# Patient Record
Sex: Female | Born: 1955 | Race: White | Hispanic: No | Marital: Married | Smoking: Never smoker
Health system: Southern US, Community
[De-identification: ages and names within clinical notes are randomized; demographics above are authoritative.]

## PROBLEM LIST (undated history)

## (undated) DIAGNOSIS — E079 Disorder of thyroid, unspecified: Secondary | ICD-10-CM

## (undated) DIAGNOSIS — F329 Major depressive disorder, single episode, unspecified: Secondary | ICD-10-CM

## (undated) DIAGNOSIS — F32A Depression, unspecified: Secondary | ICD-10-CM

## (undated) DIAGNOSIS — L680 Hirsutism: Secondary | ICD-10-CM

## (undated) DIAGNOSIS — J019 Acute sinusitis, unspecified: Secondary | ICD-10-CM

## (undated) DIAGNOSIS — E785 Hyperlipidemia, unspecified: Secondary | ICD-10-CM

## (undated) DIAGNOSIS — R21 Rash and other nonspecific skin eruption: Secondary | ICD-10-CM

## (undated) DIAGNOSIS — F341 Dysthymic disorder: Secondary | ICD-10-CM

## (undated) HISTORY — DX: Rash and other nonspecific skin eruption: R21

## (undated) HISTORY — DX: Acute sinusitis, unspecified: J01.90

## (undated) HISTORY — DX: Hyperlipidemia, unspecified: E78.5

## (undated) HISTORY — DX: Depression, unspecified: F32.A

## (undated) HISTORY — DX: Dysthymic disorder: F34.1

## (undated) HISTORY — DX: Disorder of thyroid, unspecified: E07.9

## (undated) HISTORY — PX: PARTIAL HYSTERECTOMY: SHX80

## (undated) HISTORY — DX: Hirsutism: L68.0

## (undated) HISTORY — PX: BACK SURGERY: SHX140

## (undated) HISTORY — PX: TUBAL LIGATION: SHX77

## (undated) HISTORY — PX: TONSILLECTOMY: SUR1361

## (undated) HISTORY — DX: Major depressive disorder, single episode, unspecified: F32.9

---

## 1998-01-05 ENCOUNTER — Ambulatory Visit (HOSPITAL_COMMUNITY): Admission: RE | Admit: 1998-01-05 | Discharge: 1998-01-05 | Payer: Self-pay | Admitting: Obstetrics & Gynecology

## 1998-05-04 ENCOUNTER — Emergency Department (HOSPITAL_COMMUNITY): Admission: EM | Admit: 1998-05-04 | Discharge: 1998-05-04 | Payer: Self-pay | Admitting: Emergency Medicine

## 1998-06-15 ENCOUNTER — Other Ambulatory Visit: Admission: RE | Admit: 1998-06-15 | Discharge: 1998-06-15 | Payer: Self-pay | Admitting: Obstetrics & Gynecology

## 1999-01-11 ENCOUNTER — Ambulatory Visit (HOSPITAL_COMMUNITY): Admission: RE | Admit: 1999-01-11 | Discharge: 1999-01-11 | Payer: Self-pay | Admitting: Obstetrics & Gynecology

## 1999-01-11 ENCOUNTER — Encounter: Payer: Self-pay | Admitting: Obstetrics & Gynecology

## 2000-01-13 ENCOUNTER — Encounter: Payer: Self-pay | Admitting: Obstetrics & Gynecology

## 2000-01-13 ENCOUNTER — Ambulatory Visit (HOSPITAL_COMMUNITY): Admission: RE | Admit: 2000-01-13 | Discharge: 2000-01-13 | Payer: Self-pay | Admitting: Obstetrics & Gynecology

## 2001-03-06 ENCOUNTER — Ambulatory Visit (HOSPITAL_COMMUNITY): Admission: RE | Admit: 2001-03-06 | Discharge: 2001-03-06 | Payer: Self-pay | Admitting: Neurosurgery

## 2001-03-06 ENCOUNTER — Encounter: Payer: Self-pay | Admitting: Neurosurgery

## 2001-10-25 ENCOUNTER — Encounter: Payer: Self-pay | Admitting: Obstetrics & Gynecology

## 2001-10-25 ENCOUNTER — Ambulatory Visit (HOSPITAL_COMMUNITY): Admission: RE | Admit: 2001-10-25 | Discharge: 2001-10-25 | Payer: Self-pay | Admitting: Obstetrics & Gynecology

## 2002-02-04 ENCOUNTER — Other Ambulatory Visit: Admission: RE | Admit: 2002-02-04 | Discharge: 2002-02-04 | Payer: Self-pay | Admitting: Obstetrics & Gynecology

## 2003-06-27 ENCOUNTER — Other Ambulatory Visit: Admission: RE | Admit: 2003-06-27 | Discharge: 2003-06-27 | Payer: Self-pay | Admitting: Obstetrics & Gynecology

## 2003-08-29 ENCOUNTER — Inpatient Hospital Stay (HOSPITAL_COMMUNITY): Admission: AD | Admit: 2003-08-29 | Discharge: 2003-08-30 | Payer: Self-pay | Admitting: Obstetrics and Gynecology

## 2004-07-15 ENCOUNTER — Other Ambulatory Visit: Admission: RE | Admit: 2004-07-15 | Discharge: 2004-07-15 | Payer: Self-pay | Admitting: Obstetrics & Gynecology

## 2005-07-24 ENCOUNTER — Other Ambulatory Visit: Admission: RE | Admit: 2005-07-24 | Discharge: 2005-07-24 | Payer: Self-pay | Admitting: Obstetrics & Gynecology

## 2007-09-13 ENCOUNTER — Ambulatory Visit (HOSPITAL_COMMUNITY): Admission: RE | Admit: 2007-09-13 | Discharge: 2007-09-13 | Payer: Self-pay | Admitting: Obstetrics & Gynecology

## 2008-09-13 ENCOUNTER — Ambulatory Visit (HOSPITAL_COMMUNITY): Admission: RE | Admit: 2008-09-13 | Discharge: 2008-09-13 | Payer: Self-pay | Admitting: Obstetrics & Gynecology

## 2009-09-14 ENCOUNTER — Ambulatory Visit (HOSPITAL_COMMUNITY): Admission: RE | Admit: 2009-09-14 | Discharge: 2009-09-14 | Payer: Self-pay | Admitting: Obstetrics & Gynecology

## 2010-09-16 ENCOUNTER — Ambulatory Visit (HOSPITAL_COMMUNITY)
Admission: RE | Admit: 2010-09-16 | Discharge: 2010-09-16 | Payer: Self-pay | Source: Home / Self Care | Attending: Obstetrics & Gynecology | Admitting: Obstetrics & Gynecology

## 2011-04-25 ENCOUNTER — Emergency Department (HOSPITAL_COMMUNITY): Payer: BC Managed Care – PPO

## 2011-04-25 ENCOUNTER — Emergency Department (HOSPITAL_COMMUNITY)
Admission: EM | Admit: 2011-04-25 | Discharge: 2011-04-25 | Disposition: A | Discharge status: Home / Self Care | Payer: BC Managed Care – PPO | Attending: Emergency Medicine | Admitting: Emergency Medicine

## 2011-04-25 ENCOUNTER — Inpatient Hospital Stay (INDEPENDENT_AMBULATORY_CARE_PROVIDER_SITE_OTHER)
Admission: RE | Admit: 2011-04-25 | Discharge: 2011-04-25 | Disposition: A | Discharge status: Home / Self Care | Payer: BC Managed Care – PPO | Source: Ambulatory Visit | Attending: Family Medicine | Admitting: Family Medicine

## 2011-04-25 DIAGNOSIS — E119 Type 2 diabetes mellitus without complications: Secondary | ICD-10-CM | POA: Insufficient documentation

## 2011-04-25 DIAGNOSIS — E039 Hypothyroidism, unspecified: Secondary | ICD-10-CM | POA: Insufficient documentation

## 2011-04-25 DIAGNOSIS — R1915 Other abnormal bowel sounds: Secondary | ICD-10-CM | POA: Insufficient documentation

## 2011-04-25 DIAGNOSIS — R10811 Right upper quadrant abdominal tenderness: Secondary | ICD-10-CM

## 2011-04-25 DIAGNOSIS — Z79899 Other long term (current) drug therapy: Secondary | ICD-10-CM | POA: Insufficient documentation

## 2011-04-25 DIAGNOSIS — R1011 Right upper quadrant pain: Secondary | ICD-10-CM | POA: Insufficient documentation

## 2011-04-25 LAB — COMPREHENSIVE METABOLIC PANEL
ALT: 27 U/L (ref 0–35)
Albumin: 3.8 g/dL (ref 3.5–5.2)
BUN: 11 mg/dL (ref 6–23)
CO2: 26 mEq/L (ref 19–32)
Chloride: 105 mEq/L (ref 96–112)
Creatinine, Ser: 0.69 mg/dL (ref 0.50–1.10)
GFR calc Af Amer: >60 mL/min (ref >60)
Total Protein: 7.7 g/dL (ref 6.0–8.3)

## 2011-04-25 LAB — CBC
MCH: 26.3 pg (ref 26.0–34.0)
MCHC: 32.9 g/dL (ref 30.0–36.0)
Platelets: 190 10*3/uL (ref 150–400)
RBC: 4.9 MIL/uL (ref 3.87–5.11)

## 2011-04-25 LAB — DIFFERENTIAL
Eosinophils Relative: 10 % — ABNORMAL HIGH (ref 0–5)
Monocytes Absolute: 0.4 10*3/uL (ref 0.1–1.0)
Neutro Abs: 2.6 10*3/uL (ref 1.7–7.7)
Neutrophils Relative %: 38 % — ABNORMAL LOW (ref 43–77)

## 2011-04-25 LAB — POCT URINALYSIS DIP (DEVICE)
Glucose, UA: NEGATIVE mg/dL
Hgb urine dipstick: NEGATIVE
Leukocytes, UA: NEGATIVE
Nitrite: NEGATIVE
Protein, ur: NEGATIVE mg/dL
Specific Gravity, Urine: >=1.03 (ref 1.005–1.030)
Urobilinogen, UA: 0.2 mg/dL (ref 0.0–1.0)

## 2011-04-25 LAB — LIPASE, BLOOD: Lipase: 19 U/L (ref 11–59)

## 2011-04-25 MED ORDER — GADOBENATE DIMEGLUMINE 529 MG/ML IV SOLN
20.0000 mL | Freq: Once | INTRAVENOUS | Status: AC
  Administered 2011-04-25: 20 mL via INTRAVENOUS

## 2011-05-07 ENCOUNTER — Ambulatory Visit (HOSPITAL_COMMUNITY)
Admission: RE | Admit: 2011-05-07 | Discharge: 2011-05-07 | Disposition: A | Discharge status: Home / Self Care | Payer: BC Managed Care – PPO | Source: Ambulatory Visit | Attending: Gastroenterology | Admitting: Gastroenterology

## 2011-05-07 DIAGNOSIS — E119 Type 2 diabetes mellitus without complications: Secondary | ICD-10-CM | POA: Insufficient documentation

## 2011-05-07 DIAGNOSIS — K862 Cyst of pancreas: Secondary | ICD-10-CM | POA: Insufficient documentation

## 2011-05-07 DIAGNOSIS — Z79899 Other long term (current) drug therapy: Secondary | ICD-10-CM | POA: Insufficient documentation

## 2011-05-07 DIAGNOSIS — K2970 Gastritis, unspecified, without bleeding: Secondary | ICD-10-CM | POA: Insufficient documentation

## 2011-05-07 DIAGNOSIS — K8020 Calculus of gallbladder without cholecystitis without obstruction: Secondary | ICD-10-CM | POA: Insufficient documentation

## 2011-05-07 DIAGNOSIS — E039 Hypothyroidism, unspecified: Secondary | ICD-10-CM | POA: Insufficient documentation

## 2011-05-08 ENCOUNTER — Encounter (INDEPENDENT_AMBULATORY_CARE_PROVIDER_SITE_OTHER): Payer: Self-pay | Admitting: General Surgery

## 2011-05-08 ENCOUNTER — Ambulatory Visit (INDEPENDENT_AMBULATORY_CARE_PROVIDER_SITE_OTHER): Payer: BC Managed Care – PPO | Admitting: General Surgery

## 2011-05-08 DIAGNOSIS — K862 Cyst of pancreas: Secondary | ICD-10-CM | POA: Insufficient documentation

## 2011-05-08 DIAGNOSIS — K8020 Calculus of gallbladder without cholecystitis without obstruction: Secondary | ICD-10-CM | POA: Insufficient documentation

## 2011-05-08 NOTE — Patient Instructions (Signed)
Plan for lap chole Will need f/u mri of pancreas in 3-6 mo

## 2011-05-08 NOTE — Progress Notes (Signed)
Subjective:     Patient ID: Robin Bonilla, female   DOB: 01/10/1956, 55 y.o.   MRN: [id number]  HPI We are asked to see the patient in consultation by Dr. will outlaw for gallstones. The patient is a 55 year old white female who has been expressing right upper quadrant pain for the last 2 weeks. The pain has been associated with nausea but no vomiting. The severe pain seems to come and go but she constantly has a very uncomfortable feeling in her epigastric and right upper quadrant region. No fevers or chills. No diarrhea or dysuria. No chest pain or shortness of breath. Review of Systems  Constitutional: Negative.   HENT: Negative.   Eyes: Negative.   Respiratory: Negative.   Cardiovascular: Negative.   Gastrointestinal: Negative.   Genitourinary: Negative.   Musculoskeletal: Negative.   Skin: Negative.   Neurological: Negative.   Hematological: Negative.   Psychiatric/Behavioral: Negative.    Past Medical History  Diagnosis Date  . Diabetes mellitus   . Thyroid disease   . Depression    Past Surgical History  Procedure Date  . Back surgery   . Partial hysterectomy    No current outpatient prescriptions on file.  Allergies  Allergen Reactions  . Penicillins Rash        Objective:   Physical Exam  Constitutional: She is oriented to person, place, and time. She appears well-developed and well-nourished.  HENT:  Head: Normocephalic and atraumatic.  Eyes: Conjunctivae and EOM are normal. Pupils are equal, round, and reactive to light.  Neck: Normal range of motion. Neck supple.  Cardiovascular: Normal rate, regular rhythm and normal heart sounds.   Pulmonary/Chest: Effort normal and breath sounds normal.  Abdominal: Soft. Bowel sounds are normal. There is tenderness.  Musculoskeletal: Normal range of motion.  Neurological: She is alert and oriented to person, place, and time.  Skin: Skin is warm and dry.  Psychiatric: She has a normal mood and affect. Her behavior is  normal.       Assessment:     #1 symptomatic gallstones #2 small simple cyst in the head of the pancreas.    Plan:     Because of the risk of perforators are painful episodes and possible pancreatitis I think she would benefit from having her gallbladder removed. Her ultrasound did show a large stone in her gallbladder. I have discussed in detail the risks and benefits of the operation to remove the gallbladder as well as some mechanical aspects and she understands and wishes to proceed. As far as the small cyst in the head of the pancreas goes it appeared to be a simple cyst by MRI and endoscopic ultrasound. The recommendation for now would be to follow this up in 3-6 months with repeat imaging to document stability.

## 2011-05-27 ENCOUNTER — Encounter (HOSPITAL_COMMUNITY)
Admission: RE | Admit: 2011-05-27 | Discharge: 2011-05-27 | Disposition: A | Discharge status: Home / Self Care | Payer: BC Managed Care – PPO | Source: Ambulatory Visit | Attending: General Surgery | Admitting: General Surgery

## 2011-05-27 LAB — CBC
MCV: 80.2 fL (ref 78.0–100.0)
Platelets: 202 10*3/uL (ref 150–400)
RBC: 4.91 MIL/uL (ref 3.87–5.11)
RDW: 14.3 % (ref 11.5–15.5)
WBC: 7.5 10*3/uL (ref 4.0–10.5)

## 2011-05-27 LAB — BASIC METABOLIC PANEL
CO2: 29 mEq/L (ref 19–32)
Chloride: 101 mEq/L (ref 96–112)
Creatinine, Ser: 0.81 mg/dL (ref 0.50–1.10)
GFR calc Af Amer: >60 mL/min (ref >60)
GFR calc non Af Amer: >60 mL/min (ref >60)
Glucose, Bld: 245 mg/dL — ABNORMAL HIGH (ref 70–99)
Potassium: 4.1 mEq/L (ref 3.5–5.1)

## 2011-05-27 LAB — SURGICAL PCR SCREEN
MRSA, PCR: NEGATIVE
Staphylococcus aureus: NEGATIVE

## 2011-05-30 ENCOUNTER — Other Ambulatory Visit (INDEPENDENT_AMBULATORY_CARE_PROVIDER_SITE_OTHER): Payer: Self-pay | Admitting: General Surgery

## 2011-05-30 ENCOUNTER — Ambulatory Visit (HOSPITAL_COMMUNITY): Payer: BC Managed Care – PPO

## 2011-05-30 ENCOUNTER — Ambulatory Visit (HOSPITAL_COMMUNITY): Admission: RE | Admit: 2011-05-30 | Payer: BC Managed Care – PPO | Source: Ambulatory Visit | Admitting: General Surgery

## 2011-05-30 ENCOUNTER — Ambulatory Visit (HOSPITAL_COMMUNITY)
Admission: RE | Admit: 2011-05-30 | Discharge: 2011-05-30 | Disposition: A | Discharge status: Home / Self Care | Payer: BC Managed Care – PPO | Source: Ambulatory Visit | Attending: General Surgery | Admitting: General Surgery

## 2011-05-30 DIAGNOSIS — K8010 Calculus of gallbladder with chronic cholecystitis without obstruction: Secondary | ICD-10-CM

## 2011-05-30 DIAGNOSIS — K824 Cholesterolosis of gallbladder: Secondary | ICD-10-CM

## 2011-05-30 DIAGNOSIS — E039 Hypothyroidism, unspecified: Secondary | ICD-10-CM | POA: Insufficient documentation

## 2011-05-30 DIAGNOSIS — Z01812 Encounter for preprocedural laboratory examination: Secondary | ICD-10-CM | POA: Insufficient documentation

## 2011-05-30 DIAGNOSIS — E119 Type 2 diabetes mellitus without complications: Secondary | ICD-10-CM | POA: Insufficient documentation

## 2011-05-30 DIAGNOSIS — Z01810 Encounter for preprocedural cardiovascular examination: Secondary | ICD-10-CM | POA: Insufficient documentation

## 2011-05-30 HISTORY — PX: CHOLECYSTECTOMY: SHX55

## 2011-05-30 LAB — GLUCOSE, CAPILLARY
Glucose-Capillary: 203 mg/dL — ABNORMAL HIGH (ref 70–99)
Glucose-Capillary: 211 mg/dL — ABNORMAL HIGH (ref 70–99)

## 2011-06-09 ENCOUNTER — Encounter (INDEPENDENT_AMBULATORY_CARE_PROVIDER_SITE_OTHER): Payer: Self-pay | Admitting: General Surgery

## 2011-06-09 ENCOUNTER — Ambulatory Visit (INDEPENDENT_AMBULATORY_CARE_PROVIDER_SITE_OTHER): Payer: BC Managed Care – PPO | Admitting: General Surgery

## 2011-06-09 DIAGNOSIS — R110 Nausea: Secondary | ICD-10-CM | POA: Insufficient documentation

## 2011-06-09 DIAGNOSIS — K8020 Calculus of gallbladder without cholecystitis without obstruction: Secondary | ICD-10-CM

## 2011-06-09 DIAGNOSIS — L259 Unspecified contact dermatitis, unspecified cause: Secondary | ICD-10-CM | POA: Insufficient documentation

## 2011-06-09 MED ORDER — PROMETHAZINE HCL 12.5 MG PO TABS: 12.5000 mg | ORAL_TABLET | Freq: Four times a day (QID) | ORAL | Status: AC | PRN

## 2011-06-09 MED ORDER — HYDROCORTISONE 2.5 % EX LOTN: TOPICAL_LOTION | CUTANEOUS | Status: AC

## 2011-06-09 NOTE — Patient Instructions (Signed)
Remove dermabond with vaseline Start hydrocortisone to red areas on abdomen twice a day Phenergan for nausea

## 2011-06-10 NOTE — Op Note (Signed)
NAMEYER, CASTELLO              ACCOUNT NO.:  [id number]  MEDICAL RECORD NO.:  [id number]  LOCATION:  [facility]                         FACILITY:  [facility]  PHYSICIAN:  Ollen Gross. Vernell Morgans, M.D. DATE OF BIRTH:  11/16/1955  DATE OF PROCEDURE:  05/30/2011 DATE OF DISCHARGE:  05/30/2011                              OPERATIVE REPORT   PREOPERATIVE DIAGNOSIS:  Gallstones.  POSTOPERATIVE DIAGNOSIS:  Gallstones.  PROCEDURES:  Laparoscopic cholecystectomy with intraoperative cholangiogram.  SURGEON:  Ollen Gross. Vernell Morgans, MD  ASSISTANT:  Currie Paris, MD  ANESTHESIA:  General endotracheal.  PROCEDURE:  After informed consent was obtained, the patient was brought to the operating room and placed in the supine position on the operating room table.  After adequate induction of general anesthesia, the patient's abdomen was prepped with ChloraPrep, allowed to dry and draped in usual sterile manner.  The area above the umbilicus was infiltrated with 0.25% Marcaine.  A small incision was made with 15-blade knife. This incision was carried down through the subcutaneous tissue bluntly with the hemostat and [employer] retractors.  Until linea alba was identified, the linea alba was incised with a 15-blade knife.  Each side was grasped with Kocher clamps and elevated anteriorly.  The preperitoneal space was then probed bluntly with the hemostat until the peritoneum was opened and access was gained to the abdominal cavity.  A 0 Vicryl pursestring stitch was placed in the fascia around the opening and a Hasson cannula was placed through the opening and anchored in place with a previously placed Vicryl pursestring stitch.  The abdomen was then insufflated with carbon dioxide without difficulty.  The laparoscope was then inserted through the Hasson cannula.  The patient was placed in reverse Trendelenburg position and rotated with the right side up.  Right upper quadrant was inspected and the dome of  the gallbladder was readily identified.  Next, the epigastric region was infiltrated with 0.25% Marcaine.  A small incision was made with a 15- blade knife and a 10-mm port was placed bluntly through this incision into the abdominal cavity under direct vision.  Sites were then chosen laterally on the right side of the abdomen with placement of a 5-mm ports.  Each of these areas were infiltrated with 0.25% Marcaine.  Small stab incisions were made with a 15-blade knife and 5-mm ports were then placed bluntly through these incisions into the abdominal cavity under direct vision.  A blunt grasper was placed through the lateral-most 5-mm port and used to grasp the dome of the gallbladder and elevated anteriorly and superiorly.  Another blunt grasper was placed through the other 5-mm port and used to retract on the body and neck of the gallbladder.  A dissector was then placed through the epigastrium port and using the electrocautery at the peritoneal reflection, the gallbladder neck area was opened, blunt dissection was then carried out in this area until the gallbladder neck, cystic duct junction, was readily identified and a good window was created.  A single clip was placed on the gallbladder neck.  A small ductotomy was made just below the clip with the laparoscopic scissors.  A 14-gauge Angiocath was placed percutaneously through the anterior  abdominal wall under direct vision.  A Reddick cholangiogram catheter was placed through the Angiocath and flushed.  The Reddick catheter was then placed within the cystic duct and anchored in place with a clip.  Cholangiogram was obtained.  This showed no filling defects, good emptying in duodenum, and adequate length on the cystic duct.  The anchoring clip and catheters were removed from the patient.  Three clips were placed proximally on the cystic duct and the duct was divided between the two sets of clips.  Posterior to this, the cystic artery  was identified and again dissected bluntly in circumferential manner until a good window was created.  We could actually see the hepatic artery very well and it was large and very close by, but we were able to identify the branch that came off to the gallbladder.  Two clips were placed proximally on this branch of the artery and one distally and the artery was divided between the two.  Next, the laparoscopic hook cautery device was used to separate the gallbladder from the liver bed.  Prior to completely detaching the gallbladder from the liver bed, the liver bed was inspected.  Several small bleeding points were coagulated with electrocautery until the area was completely hemostatic.  The gallbladder was then detached and rest away from the liver bed without difficulty.  A lap laparoscopic bag was inserted through the epigastric port.  The gallbladder was placed in the bag and the bag was sealed. The abdomen was then irrigated with copious amounts of saline until the effluent was clear.  The laparoscope was then moved to the epigastric port.  A gallbladder grasper was placed through the Hasson cannula and used to grasp the opening of the bag.  The bag with the gallbladder was removed with the Hasson cannula through the supraumbilical port without difficulty.  The fascial defect was then closed with a previously placed Vicryl pursestring stitch as well as with another figure-of-eight 0 Vicryl stitch.  The rest of the ports were removed under direct vision and were found to be hemostatic.  The gas was allowed to escape.  The skin incisions were all closed with interrupted 4-0 Monocryl subcuticular stitches and Dermabond dressings were applied.  The patient tolerated the procedure well.  At the end of the case, all needle, sponge, and instrument counts were correct.  The patient was then awakened and taken to the recovery room in stable condition.     Ollen Gross. Vernell Morgans,  M.D.     PST/MEDQ  D:  05/30/2011  T:  05/30/2011  Job:  [id number]  Electronically Signed by Chevis Pretty III M.D. on 06/10/2011 04:54:09 AM

## 2011-06-12 ENCOUNTER — Encounter (INDEPENDENT_AMBULATORY_CARE_PROVIDER_SITE_OTHER): Payer: Self-pay | Admitting: General Surgery

## 2011-06-12 NOTE — Progress Notes (Signed)
Subjective:     Patient ID: Robin Bonilla, female   DOB: 1955/12/20, 55 y.o.   MRN: [id number]  HPI The patient is a 55 year old white female who is now a couple weeks out from a laparoscopic cholecystectomy. She was doing very well but woke up this morning nauseated. She has not vomited. She denies any fevers or chills. She denies any abdominal pain. Review of Systems     Objective:   Physical Exam On exam her abdomen is soft and nontender. Her incisions are healing reasonably well. She does have what looks like some contact dermatitis from the Dermabond.    Assessment:     2 weeks postop from a laparoscopic cholecystectomy. Now with nausea that started this morning    Plan:     At this point I will prescribe some Phenergan to help with her nausea. I've also recommended she removed the Dermabond and start applying hydrocortisone cream to each of the incisions twice a day for the next week. We will see her back in about 2 or 3 weeks to check her progress.

## 2011-06-26 ENCOUNTER — Encounter (INDEPENDENT_AMBULATORY_CARE_PROVIDER_SITE_OTHER): Payer: Self-pay | Admitting: General Surgery

## 2011-06-26 ENCOUNTER — Ambulatory Visit (INDEPENDENT_AMBULATORY_CARE_PROVIDER_SITE_OTHER): Payer: BC Managed Care – PPO | Admitting: General Surgery

## 2011-06-26 DIAGNOSIS — K8020 Calculus of gallbladder without cholecystitis without obstruction: Secondary | ICD-10-CM

## 2011-06-26 NOTE — Patient Instructions (Signed)
Avoid heavy lifting for another couple weeks then return to normal activities

## 2011-06-26 NOTE — Progress Notes (Signed)
Subjective:     Patient ID: Robin Bonilla, female   DOB: [birth year], 55 y.o.   MRN: [id number]  HPI This is a 55 year old white female is a month out from a laparoscopic cholecystectomy. Her postoperative course was complicated by some contact dermatitis from the Dermabond. We have been treating her with a steroid cream and the dermatitis seems to have resolved. The last time we saw her she also had some nausea and this is also resolved now. Her appetite is good now and her bowels are moving normally. Review of Systems     Objective:   Physical Exam On exam her abdomen is soft and nontender. The dermatitis is pretty much resolved. Her incisions are healing nicely. She has no abdominal pain.   Assessment:     One month postop from left upper cholecystectomy.    Plan:     At this point I think she can begin returning to normal activities in about 2 weeks. We'll plan to see her back on a p.r.n. basis. She agrees to call us if she has any problems in the future.

## 2011-08-25 ENCOUNTER — Other Ambulatory Visit (HOSPITAL_COMMUNITY): Payer: Self-pay | Admitting: Obstetrics & Gynecology

## 2011-08-25 DIAGNOSIS — Z1231 Encounter for screening mammogram for malignant neoplasm of breast: Secondary | ICD-10-CM

## 2011-09-29 ENCOUNTER — Ambulatory Visit (HOSPITAL_COMMUNITY): Payer: BC Managed Care – PPO

## 2012-05-03 LAB — HM HEPATITIS C SCREENING LAB: HM Hepatitis Screen: NEGATIVE

## 2012-05-03 LAB — HEPATITIS B SURFACE ANTIGEN: Hepatitis B Surface Ag: NEGATIVE

## 2012-06-04 ENCOUNTER — Other Ambulatory Visit: Payer: Self-pay | Admitting: Gastroenterology

## 2012-06-04 DIAGNOSIS — K862 Cyst of pancreas: Secondary | ICD-10-CM

## 2012-06-10 ENCOUNTER — Ambulatory Visit
Admission: RE | Admit: 2012-06-10 | Discharge: 2012-06-10 | Disposition: A | Discharge status: Home / Self Care | Payer: BC Managed Care – PPO | Source: Ambulatory Visit | Attending: Gastroenterology | Admitting: Gastroenterology

## 2012-06-10 DIAGNOSIS — K863 Pseudocyst of pancreas: Secondary | ICD-10-CM

## 2012-06-10 DIAGNOSIS — K862 Cyst of pancreas: Secondary | ICD-10-CM

## 2012-06-10 MED ORDER — GADOBENATE DIMEGLUMINE 529 MG/ML IV SOLN
18.0000 mL | Freq: Once | INTRAVENOUS | Status: AC | PRN
  Administered 2012-06-10: 18 mL via INTRAVENOUS

## 2012-08-31 ENCOUNTER — Other Ambulatory Visit (HOSPITAL_COMMUNITY): Payer: Self-pay | Admitting: Obstetrics & Gynecology

## 2012-08-31 DIAGNOSIS — Z1231 Encounter for screening mammogram for malignant neoplasm of breast: Secondary | ICD-10-CM

## 2012-09-17 ENCOUNTER — Ambulatory Visit (HOSPITAL_COMMUNITY)
Admission: RE | Admit: 2012-09-17 | Discharge: 2012-09-17 | Disposition: A | Discharge status: Home / Self Care | Payer: BC Managed Care – PPO | Source: Ambulatory Visit | Attending: Obstetrics & Gynecology | Admitting: Obstetrics & Gynecology

## 2012-09-17 DIAGNOSIS — Z1231 Encounter for screening mammogram for malignant neoplasm of breast: Secondary | ICD-10-CM

## 2013-02-16 ENCOUNTER — Encounter (HOSPITAL_COMMUNITY): Payer: Self-pay

## 2013-02-16 ENCOUNTER — Emergency Department (HOSPITAL_COMMUNITY)
Admission: EM | Admit: 2013-02-16 | Discharge: 2013-02-16 | Disposition: A | Discharge status: Home / Self Care | Payer: BC Managed Care – PPO | Attending: Emergency Medicine | Admitting: Emergency Medicine

## 2013-02-16 ENCOUNTER — Emergency Department (HOSPITAL_COMMUNITY): Payer: BC Managed Care – PPO

## 2013-02-16 DIAGNOSIS — N200 Calculus of kidney: Secondary | ICD-10-CM | POA: Insufficient documentation

## 2013-02-16 DIAGNOSIS — Z872 Personal history of diseases of the skin and subcutaneous tissue: Secondary | ICD-10-CM | POA: Insufficient documentation

## 2013-02-16 DIAGNOSIS — R112 Nausea with vomiting, unspecified: Secondary | ICD-10-CM | POA: Insufficient documentation

## 2013-02-16 DIAGNOSIS — E119 Type 2 diabetes mellitus without complications: Secondary | ICD-10-CM | POA: Insufficient documentation

## 2013-02-16 DIAGNOSIS — Z79899 Other long term (current) drug therapy: Secondary | ICD-10-CM | POA: Insufficient documentation

## 2013-02-16 DIAGNOSIS — Z880 Allergy status to penicillin: Secondary | ICD-10-CM | POA: Insufficient documentation

## 2013-02-16 DIAGNOSIS — Z90711 Acquired absence of uterus with remaining cervical stump: Secondary | ICD-10-CM | POA: Insufficient documentation

## 2013-02-16 DIAGNOSIS — Z8659 Personal history of other mental and behavioral disorders: Secondary | ICD-10-CM | POA: Insufficient documentation

## 2013-02-16 DIAGNOSIS — E079 Disorder of thyroid, unspecified: Secondary | ICD-10-CM | POA: Insufficient documentation

## 2013-02-16 DIAGNOSIS — Z9089 Acquired absence of other organs: Secondary | ICD-10-CM | POA: Insufficient documentation

## 2013-02-16 LAB — URINALYSIS, ROUTINE W REFLEX MICROSCOPIC
Leukocytes, UA: NEGATIVE
Nitrite: NEGATIVE
Specific Gravity, Urine: 1.02 (ref 1.005–1.030)
Urobilinogen, UA: 0.2 mg/dL (ref 0.0–1.0)
pH: 6.5 (ref 5.0–8.0)

## 2013-02-16 LAB — URINE MICROSCOPIC-ADD ON

## 2013-02-16 MED ORDER — KETOROLAC TROMETHAMINE 30 MG/ML IJ SOLN
30.0000 mg | Freq: Once | INTRAMUSCULAR | Status: AC
  Administered 2013-02-16: 30 mg via INTRAVENOUS
  Filled 2013-02-16: qty 1

## 2013-02-16 MED ORDER — OXYCODONE-ACETAMINOPHEN 5-325 MG PO TABS
2.0000 | ORAL_TABLET | ORAL | Status: DC | PRN
Start: 2013-02-16 — End: 2014-04-30

## 2013-02-16 MED ORDER — SODIUM CHLORIDE 0.9 % IV BOLUS (SEPSIS)
1000.0000 mL | Freq: Once | INTRAVENOUS | Status: AC
  Administered 2013-02-16: 1000 mL via INTRAVENOUS

## 2013-02-16 MED ORDER — MORPHINE SULFATE 4 MG/ML IJ SOLN
4.0000 mg | Freq: Once | INTRAMUSCULAR | Status: AC
  Administered 2013-02-16: 4 mg via INTRAVENOUS
  Filled 2013-02-16: qty 1

## 2013-02-16 MED ORDER — ONDANSETRON HCL 4 MG/2ML IJ SOLN
4.0000 mg | Freq: Once | INTRAMUSCULAR | Status: AC
  Administered 2013-02-16: 4 mg via INTRAVENOUS
  Filled 2013-02-16: qty 2

## 2013-02-16 NOTE — ED Notes (Signed)
Pharmacy in room at this time.

## 2013-02-16 NOTE — Discharge Instructions (Signed)

## 2013-02-16 NOTE — ED Provider Notes (Signed)
History     CSN: [id number]  Arrival date & time 02/16/13  4098   First MD Initiated Contact with Patient 02/16/13 [phone]      Chief Complaint  Patient presents with  . Flank Pain    (Consider location/radiation/quality/duration/timing/severity/associated sxs/prior treatment) HPI Comments: Patient woke from sleep this morning with the acute onset of pain in the right flank with radiation to the right lower abdomen.  She felt fine when she went to bed.  She is nauseated and has vomited.  No urinary complaints.  No vomiting or diarrhea.    Patient is a 57 y.o. female presenting with flank pain. The history is provided by the patient.  Flank Pain This is a new problem. Episode onset: 1AM. The problem occurs constantly. The problem has not changed since onset.Associated symptoms include abdominal pain. Nothing aggravates the symptoms. Nothing relieves the symptoms. She has tried nothing for the symptoms.    Past Medical History  Diagnosis Date  . Diabetes mellitus   . Thyroid disease   . Depression   . Rash, skin     Past Surgical History  Procedure Laterality Date  . Back surgery    . Partial hysterectomy    . Cholecystectomy  05/30/11    Family History  Problem Relation Age of Onset  . Breast cancer Mother     History  Substance Use Topics  . Smoking status: Never Smoker   . Smokeless tobacco: Not on file  . Alcohol Use: No    OB History   Grav Para Term Preterm Abortions TAB SAB Ect Mult Living                  Review of Systems  Gastrointestinal: Positive for abdominal pain.  Genitourinary: Positive for flank pain.  All other systems reviewed and are negative.    Allergies  Penicillins  Home Medications   Current Outpatient Rx  Name  Route  Sig  Dispense  Refill  . glimepiride (AMARYL) 2 MG tablet   Oral   Take 2 mg by mouth daily before breakfast.         . levothyroxine (SYNTHROID, LEVOTHROID) 125 MCG tablet   Oral   Take 125 mcg by mouth  daily.           . metFORMIN (GLUMETZA) 500 MG (MOD) 24 hr tablet   Oral   Take 1,000 mg by mouth 2 (two) times daily.            BP 153/86  Pulse 82  Temp(Src) 98.7 F (37.1 C) (Oral)  Resp 22  SpO2 100%  Physical Exam  Nursing note and vitals reviewed. Constitutional: She is oriented to person, place, and time. She appears well-developed and well-nourished. No distress.  HENT:  Head: Normocephalic and atraumatic.  Neck: Normal range of motion. Neck supple.  Cardiovascular: Normal rate and regular rhythm.  Exam reveals no gallop and no friction rub.   No murmur heard. Pulmonary/Chest: Effort normal and breath sounds normal. No respiratory distress. She has no wheezes.  Abdominal: Soft. Bowel sounds are normal. She exhibits no distension. There is tenderness.  There is ttp in the right flank and right lower quadrant.  There is no rebound or guarding.  Musculoskeletal: Normal range of motion.  Neurological: She is alert and oriented to person, place, and time.  Skin: Skin is warm and dry. She is not diaphoretic.    ED Course  Procedures (including critical care time)  Labs Reviewed  URINALYSIS,  ROUTINE W REFLEX MICROSCOPIC   No results found.   No diagnosis found.    MDM  The ua shows blood and the ct scan confirms a 2.5 mm stone in the right uvj.  She is feeling better with meds given.  There is no fever or wbc and appears stable for discharge.  Will prescribe pain meds, prn follow up with Urology prn.        Geoffery Lyons, MD 02/16/13 [phone]

## 2013-02-16 NOTE — ED Notes (Signed)
Patient presents with c/o right flank pain that radiates to lower back with N/V. Onset around 1 am. Endorses chills. Denies fevers, sweats, diarrhea or constipation. Denies abdominal pain, dysuria, hematuria, vaginal odor or discharge.

## 2013-02-16 NOTE — ED Notes (Signed)
Patient ambulating to restroom at this time.

## 2013-02-16 NOTE — ED Notes (Signed)
Patient given discharge instructions for kidney stones. rx for percocet. Advised to follow up with urology. Patient voiced understanding of all instructions and had no further questions. Patient ambulated to front lobby without difficulty.

## 2013-02-19 ENCOUNTER — Emergency Department (HOSPITAL_COMMUNITY): Payer: BC Managed Care – PPO

## 2013-02-19 ENCOUNTER — Encounter (HOSPITAL_COMMUNITY): Payer: Self-pay | Admitting: *Deleted

## 2013-02-19 ENCOUNTER — Emergency Department (HOSPITAL_COMMUNITY)
Admission: EM | Admit: 2013-02-19 | Discharge: 2013-02-19 | Disposition: A | Discharge status: Home / Self Care | Payer: BC Managed Care – PPO | Attending: Emergency Medicine | Admitting: Emergency Medicine

## 2013-02-19 DIAGNOSIS — Z79899 Other long term (current) drug therapy: Secondary | ICD-10-CM | POA: Insufficient documentation

## 2013-02-19 DIAGNOSIS — E119 Type 2 diabetes mellitus without complications: Secondary | ICD-10-CM | POA: Insufficient documentation

## 2013-02-19 DIAGNOSIS — Z9089 Acquired absence of other organs: Secondary | ICD-10-CM | POA: Insufficient documentation

## 2013-02-19 DIAGNOSIS — E079 Disorder of thyroid, unspecified: Secondary | ICD-10-CM | POA: Insufficient documentation

## 2013-02-19 DIAGNOSIS — Z90710 Acquired absence of both cervix and uterus: Secondary | ICD-10-CM | POA: Insufficient documentation

## 2013-02-19 DIAGNOSIS — R110 Nausea: Secondary | ICD-10-CM | POA: Insufficient documentation

## 2013-02-19 DIAGNOSIS — N23 Unspecified renal colic: Secondary | ICD-10-CM | POA: Insufficient documentation

## 2013-02-19 DIAGNOSIS — Z8659 Personal history of other mental and behavioral disorders: Secondary | ICD-10-CM | POA: Insufficient documentation

## 2013-02-19 DIAGNOSIS — Z872 Personal history of diseases of the skin and subcutaneous tissue: Secondary | ICD-10-CM | POA: Insufficient documentation

## 2013-02-19 DIAGNOSIS — R6883 Chills (without fever): Secondary | ICD-10-CM | POA: Insufficient documentation

## 2013-02-19 LAB — CBC WITH DIFFERENTIAL/PLATELET
Basophils Absolute: 0 10*3/uL (ref 0.0–0.1)
Basophils Relative: 0 % (ref 0–1)
Eosinophils Absolute: 0.6 10*3/uL (ref 0.0–0.7)
Hemoglobin: 13.6 g/dL (ref 12.0–15.0)
MCH: 27.1 pg (ref 26.0–34.0)
MCHC: 33.6 g/dL (ref 30.0–36.0)
Monocytes Relative: 5 % (ref 3–12)
Neutro Abs: 5.7 10*3/uL (ref 1.7–7.7)
Neutrophils Relative %: 62 % (ref 43–77)
RDW: 14.2 % (ref 11.5–15.5)

## 2013-02-19 LAB — URINALYSIS, ROUTINE W REFLEX MICROSCOPIC
Bilirubin Urine: NEGATIVE
Ketones, ur: NEGATIVE mg/dL
Leukocytes, UA: NEGATIVE
Nitrite: NEGATIVE
Specific Gravity, Urine: 1.03 (ref 1.005–1.030)
Urobilinogen, UA: 0.2 mg/dL (ref 0.0–1.0)

## 2013-02-19 LAB — BASIC METABOLIC PANEL
BUN: 15 mg/dL (ref 6–23)
Chloride: 100 mEq/L (ref 96–112)
Creatinine, Ser: 0.87 mg/dL (ref 0.50–1.10)
GFR calc Af Amer: 85 mL/min — ABNORMAL LOW (ref >90)
GFR calc non Af Amer: 74 mL/min — ABNORMAL LOW (ref >90)
Potassium: 3.9 mEq/L (ref 3.5–5.1)

## 2013-02-19 MED ORDER — KETOROLAC TROMETHAMINE 30 MG/ML IJ SOLN
30.0000 mg | Freq: Once | INTRAMUSCULAR | Status: AC
  Administered 2013-02-19: 30 mg via INTRAVENOUS
  Filled 2013-02-19: qty 1

## 2013-02-19 MED ORDER — MORPHINE SULFATE 4 MG/ML IJ SOLN
4.0000 mg | Freq: Once | INTRAMUSCULAR | Status: AC
  Administered 2013-02-19: 4 mg via INTRAVENOUS
  Filled 2013-02-19: qty 1

## 2013-02-19 MED ORDER — TAMSULOSIN HCL 0.4 MG PO CAPS: 0.4000 mg | ORAL_CAPSULE | Freq: Every day | ORAL | Status: DC

## 2013-02-19 MED ORDER — SODIUM CHLORIDE 0.9 % IV SOLN
1000.0000 mL | INTRAVENOUS | Status: DC
  Administered 2013-02-19: 1000 mL via INTRAVENOUS

## 2013-02-19 MED ORDER — ONDANSETRON HCL 4 MG/2ML IJ SOLN
4.0000 mg | Freq: Once | INTRAMUSCULAR | Status: AC
  Administered 2013-02-19: 4 mg via INTRAVENOUS
  Filled 2013-02-19: qty 2

## 2013-02-19 MED ORDER — ONDANSETRON HCL 4 MG PO TABS: 4.0000 mg | ORAL_TABLET | Freq: Four times a day (QID) | ORAL | Status: DC | PRN

## 2013-02-19 MED ORDER — OXYCODONE-ACETAMINOPHEN 5-325 MG PO TABS: 1.0000 | ORAL_TABLET | ORAL | Status: DC | PRN

## 2013-02-19 MED ORDER — NAPROXEN 500 MG PO TABS: 500.0000 mg | ORAL_TABLET | Freq: Two times a day (BID) | ORAL | Status: DC

## 2013-02-19 MED ORDER — SODIUM CHLORIDE 0.9 % IV SOLN
1000.0000 mL | Freq: Once | INTRAVENOUS | Status: AC
  Administered 2013-02-19: 1000 mL via INTRAVENOUS

## 2013-02-19 NOTE — ED Notes (Signed)
Pt reports right sided flank pain. Was treated at [facility] ED on Wednesday, dx with kidney stone. Pain has gotten progressively worse. Has not passed stone. No hx of stones

## 2013-02-19 NOTE — ED Provider Notes (Signed)
History     CSN: [id number]  Arrival date & time 02/19/13  1308   First MD Initiated Contact with Patient 02/19/13 [phone]      Chief Complaint  Patient presents with  . Flank Pain    (Consider location/radiation/quality/duration/timing/severity/associated sxs/prior treatment) Patient is a 57 y.o. female presenting with flank pain. The history is provided by the patient.  Flank Pain  She had been in the emergency department 3 days ago with a kidney stone on the right side. She had been doing reasonably well until this morning when pain recurred. Pain is in the right lower abdomen with radiation to the right flank. Pain is severe and she rates it at 10/10. Nothing makes it better and nothing makes it worse. There is associated nausea without vomiting. She has had chills without fever or sweats. She denies dysuria or urinary urgency or hesitancy.  Past Medical History  Diagnosis Date  . Diabetes mellitus   . Thyroid disease   . Depression   . Rash, skin     Past Surgical History  Procedure Laterality Date  . Back surgery    . Partial hysterectomy    . Cholecystectomy  05/30/11    Family History  Problem Relation Age of Onset  . Breast cancer Mother     History  Substance Use Topics  . Smoking status: Never Smoker   . Smokeless tobacco: Not on file  . Alcohol Use: No    OB History   Grav Para Term Preterm Abortions TAB SAB Ect Mult Living                  Review of Systems  Genitourinary: Positive for flank pain.  All other systems reviewed and are negative.    Allergies  Penicillins  Home Medications   Current Outpatient Rx  Name  Route  Sig  Dispense  Refill  . glimepiride (AMARYL) 2 MG tablet   Oral   Take 2 mg by mouth daily before breakfast.         . levothyroxine (SYNTHROID, LEVOTHROID) 125 MCG tablet   Oral   Take 125 mcg by mouth daily.           . metFORMIN (GLUMETZA) 500 MG (MOD) 24 hr tablet   Oral   Take 1,000 mg by mouth 2 (two)  times daily.          Marland Kitchen oxyCODONE-acetaminophen (PERCOCET) 5-325 MG per tablet   Oral   Take 2 tablets by mouth every 4 (four) hours as needed for pain.   20 tablet   0     BP 161/76  Pulse 79  Temp(Src) 98.8 F (37.1 C) (Oral)  Resp 26  SpO2 100%  Physical Exam  Nursing note and vitals reviewed.  57 year old female, who appears uncomfortable, but is in no acute distress. Vital signs are significant for hypertension with blood pressure 161/76, and tachypnea with respiratory rate of 26. Oxygen saturation is 100%, which is normal. Head is normocephalic and atraumatic. PERRLA, EOMI. Oropharynx is clear. Neck is nontender and supple without adenopathy or JVD. Back is nontender in the midline. Lungs are clear without rales, wheezes, or rhonchi. Chest is nontender. Heart has regular rate and rhythm without murmur. Abdomen is soft, flat, with moderate right lower quadrant and suprapubic tenderness. There is no rebound or guarding. There is right CVA tenderness. There are no masses or hepatosplenomegaly and peristalsis is hypoactive. Extremities have no cyanosis or edema, full range of motion  is present. Skin is warm and dry without rash. Neurologic: Mental status is normal, cranial nerves are intact, there are no motor or sensory deficits.  ED Course  Procedures (including critical care time)  Results for orders placed during the hospital encounter of 02/19/13  CBC WITH DIFFERENTIAL      Result Value Range   WBC 9.2  4.0 - 10.5 K/uL   RBC 5.02  3.87 - 5.11 MIL/uL   Hemoglobin 13.6  12.0 - 15.0 g/dL   HCT 29.5  28.4 - 13.2 %   MCV 80.7  78.0 - 100.0 fL   MCH 27.1  26.0 - 34.0 pg   MCHC 33.6  30.0 - 36.0 g/dL   RDW 44.0  10.2 - 72.5 %   Platelets 232  150 - 400 K/uL   Neutrophils Relative % 62  43 - 77 %   Neutro Abs 5.7  1.7 - 7.7 K/uL   Lymphocytes Relative 27  12 - 46 %   Lymphs Abs 2.5  0.7 - 4.0 K/uL   Monocytes Relative 5  3 - 12 %   Monocytes Absolute 0.5  0.1 -  1.0 K/uL   Eosinophils Relative 6 (*) 0 - 5 %   Eosinophils Absolute 0.6  0.0 - 0.7 K/uL   Basophils Relative 0  0 - 1 %   Basophils Absolute 0.0  0.0 - 0.1 K/uL  BASIC METABOLIC PANEL      Result Value Range   Sodium 138  135 - 145 mEq/L   Potassium 3.9  3.5 - 5.1 mEq/L   Chloride 100  96 - 112 mEq/L   CO2 27  19 - 32 mEq/L   Glucose, Bld 161 (*) 70 - 99 mg/dL   BUN 15  6 - 23 mg/dL   Creatinine, Ser 3.66  0.50 - 1.10 mg/dL   Calcium 44.0  8.4 - 34.7 mg/dL   GFR calc non Af Amer 74 (*) >90 mL/min   GFR calc Af Amer 85 (*) >90 mL/min  URINALYSIS, ROUTINE W REFLEX MICROSCOPIC      Result Value Range   Color, Urine YELLOW  YELLOW   APPearance CLOUDY (*) CLEAR   Specific Gravity, Urine 1.030  1.005 - 1.030   pH 5.0  5.0 - 8.0   Glucose, UA 100 (*) NEGATIVE mg/dL   Hgb urine dipstick MODERATE (*) NEGATIVE   Bilirubin Urine NEGATIVE  NEGATIVE   Ketones, ur NEGATIVE  NEGATIVE mg/dL   Protein, ur NEGATIVE  NEGATIVE mg/dL   Urobilinogen, UA 0.2  0.0 - 1.0 mg/dL   Nitrite NEGATIVE  NEGATIVE   Leukocytes, UA NEGATIVE  NEGATIVE  URINE MICROSCOPIC-ADD ON      Result Value Range   Squamous Epithelial / LPF RARE  RARE   WBC, UA 0-2  <3 WBC/hpf   Bacteria, UA RARE  RARE   Crystals CA OXALATE CRYSTALS (*) NEGATIVE   Ct Abdomen Pelvis Wo Contrast  02/16/2013   *RADIOLOGY REPORT*  Clinical Data: Flank pain.  CT ABDOMEN AND PELVIS WITHOUT CONTRAST  Technique:  Multidetector CT imaging of the abdomen and pelvis was performed following the standard protocol without intravenous contrast.  Comparison: None  Findings: The lung bases are clear.  The unenhanced appearance of the liver and spleen are unremarkable. No focal lesions or biliary dilatation.  Gallbladder surgically absent.  No common bile duct dilatation. The pancreas is unremarkable.  Small cysts noted in the pancreatic head, unchanged since prior MRI. There is a small  left adrenal gland nodule consistent with a benign adenoma.  The right  adrenal gland is normal.  The right kidney demonstrates hydronephrosis and perinephric interstitial changes consistent with obstruction.  A right renal calculus is noted.  The right ureter is mildly dilated all way down to obstructing 2.5 mm right distal ureteral calculus.  The stomach, duodenum, the small bowel and colon are unremarkable without oral contrast. No inflammatory changes or mass lesions.  No obstructive findings.  The aorta is normal in caliber.  No mesenteric or retroperitoneal mass or adenopathy.  There are small scattered mesenteric and retroperitoneal lymph nodes.  The bladder is normal.  No pelvic mass or adenopathy.  The uterus is surgically absent.  The right ovary is normal.  The left ovary contains a simple appearing 3 cm cyst.  IMPRESSION:  1. 1.  2.5 mm right distal ureteral calculus causing moderate grade obstruction of the right kidney and ureter. 2.  Right renal calculi. 3.  Small left adrenal gland adenoma. 4.  Left ovarian cyst.   Original Report Authenticated By: Rudie Meyer, M.D.   US Renal  02/19/2013   *RADIOLOGY REPORT*  Clinical Data: Recent diagnosis of obstructing right UVJ calculus, persistent right flank pain.  RENAL/URINARY TRACT ULTRASOUND COMPLETE  Comparison:  CT abdomen and pelvis 02/16/2013 [facility].  Findings:  Right Kidney:  Persistent mild to moderate right hydronephrosis, unchanged from the recent CT.  This did not change after voiding. Well-preserved cortex.  Normal parenchymal echotexture.  No focal parenchymal abnormality.  Approximately 12.9 cm in length.  Left Kidney:  No hydronephrosis.  Well-preserved cortex.  No shadowing calculi.  Normal size and parenchymal echotexture without focal abnormalities.  Approximately 11.7 cm in length.  Bladder:  Decompressed and normal in appearance.  Left ureteral jet visualized at color Doppler evaluation.  Right ureteral jet not visualized.  IMPRESSION:  1.  Mild to moderate right hydronephrosis, unchanged  since the CT examination 3 days ago.  That examination demonstrated an obstructing 2 mm right UVJ calculus. 2.  No right ureteral jet identified in the bladder, consistent with persistent obstruction. 3.  Normal-appearing left kidney.   Original Report Authenticated By: Hulan Saas, M.D.      1. Ureteral colic       MDM  Probable recurrent ureteral colic. Old records are reviewed and she had an obstructing right distal ureter calculus when she was seen in the ED 3 days ago. The calculus was 2.5 mm, which she should be able to pass spontaneously. She will be given IV fluids, IV ketorolac, IV ondansetron, and IV morphine. Ultrasound will be obtained to evaluate the calculus.  Ultrasound shows persistent ureteral obstruction. She got good relief of pain with the above-noted medications. She is discharged with prescriptions for Percocet, naproxen, ondansetron, and tamsulosin. She has an appointment with her urologist in 5 days and she is to keep that appointment. Return if symptoms worsen.      Dione Booze, MD 02/19/13 1332

## 2013-08-29 ENCOUNTER — Other Ambulatory Visit (HOSPITAL_COMMUNITY): Payer: Self-pay | Admitting: Obstetrics & Gynecology

## 2013-08-29 DIAGNOSIS — Z1231 Encounter for screening mammogram for malignant neoplasm of breast: Secondary | ICD-10-CM

## 2013-09-19 ENCOUNTER — Ambulatory Visit (HOSPITAL_COMMUNITY)
Admission: RE | Admit: 2013-09-19 | Discharge: 2013-09-19 | Disposition: A | Discharge status: Home / Self Care | Payer: BC Managed Care – PPO | Source: Ambulatory Visit | Attending: Obstetrics & Gynecology | Admitting: Obstetrics & Gynecology

## 2013-09-19 DIAGNOSIS — Z1231 Encounter for screening mammogram for malignant neoplasm of breast: Secondary | ICD-10-CM | POA: Insufficient documentation

## 2014-04-30 ENCOUNTER — Emergency Department (HOSPITAL_COMMUNITY)
Admission: EM | Admit: 2014-04-30 | Discharge: 2014-04-30 | Disposition: A | Discharge status: Other Acute Inpatient Hospital | Payer: BC Managed Care – PPO | Source: Home / Self Care | Attending: Family Medicine | Admitting: Family Medicine

## 2014-04-30 ENCOUNTER — Encounter (HOSPITAL_COMMUNITY): Payer: Self-pay | Admitting: Emergency Medicine

## 2014-04-30 ENCOUNTER — Emergency Department (HOSPITAL_COMMUNITY)
Admission: EM | Admit: 2014-04-30 | Discharge: 2014-04-30 | Disposition: A | Discharge status: Home / Self Care | Payer: BC Managed Care – PPO | Attending: Emergency Medicine | Admitting: Emergency Medicine

## 2014-04-30 DIAGNOSIS — N390 Urinary tract infection, site not specified: Secondary | ICD-10-CM

## 2014-04-30 DIAGNOSIS — N12 Tubulo-interstitial nephritis, not specified as acute or chronic: Secondary | ICD-10-CM

## 2014-04-30 DIAGNOSIS — E119 Type 2 diabetes mellitus without complications: Secondary | ICD-10-CM | POA: Insufficient documentation

## 2014-04-30 DIAGNOSIS — Z880 Allergy status to penicillin: Secondary | ICD-10-CM | POA: Insufficient documentation

## 2014-04-30 DIAGNOSIS — Z8659 Personal history of other mental and behavioral disorders: Secondary | ICD-10-CM | POA: Insufficient documentation

## 2014-04-30 DIAGNOSIS — R509 Fever, unspecified: Secondary | ICD-10-CM | POA: Insufficient documentation

## 2014-04-30 DIAGNOSIS — R109 Unspecified abdominal pain: Secondary | ICD-10-CM | POA: Insufficient documentation

## 2014-04-30 DIAGNOSIS — E079 Disorder of thyroid, unspecified: Secondary | ICD-10-CM | POA: Insufficient documentation

## 2014-04-30 DIAGNOSIS — Z791 Long term (current) use of non-steroidal anti-inflammatories (NSAID): Secondary | ICD-10-CM | POA: Insufficient documentation

## 2014-04-30 DIAGNOSIS — Z79899 Other long term (current) drug therapy: Secondary | ICD-10-CM | POA: Insufficient documentation

## 2014-04-30 DIAGNOSIS — R7309 Other abnormal glucose: Secondary | ICD-10-CM

## 2014-04-30 LAB — BASIC METABOLIC PANEL
Anion gap: 17 — ABNORMAL HIGH (ref 5–15)
BUN: 11 mg/dL (ref 6–23)
CALCIUM: 9.2 mg/dL (ref 8.4–10.5)
CO2: 23 mEq/L (ref 19–32)
CREATININE: 0.76 mg/dL (ref 0.50–1.10)
Chloride: 102 mEq/L (ref 96–112)
GFR calc Af Amer: >90 mL/min (ref >90)
GLUCOSE: 202 mg/dL — AB (ref 70–99)
Potassium: 3.6 mEq/L — ABNORMAL LOW (ref 3.7–5.3)
Sodium: 142 mEq/L (ref 137–147)

## 2014-04-30 LAB — POCT I-STAT, CHEM 8
BUN: 11 mg/dL (ref 6–23)
CALCIUM ION: 1.03 mmol/L — AB (ref 1.12–1.23)
Chloride: 106 mEq/L (ref 96–112)
Creatinine, Ser: 0.8 mg/dL (ref 0.50–1.10)
Glucose, Bld: 230 mg/dL — ABNORMAL HIGH (ref 70–99)
HCT: 46 % (ref 36.0–46.0)
Hemoglobin: 15.6 g/dL — ABNORMAL HIGH (ref 12.0–15.0)
Potassium: 3.5 mEq/L — ABNORMAL LOW (ref 3.7–5.3)
Sodium: 138 mEq/L (ref 137–147)
TCO2: 22 mmol/L (ref 0–100)

## 2014-04-30 LAB — URINE MICROSCOPIC-ADD ON

## 2014-04-30 LAB — CBC
HCT: 40.8 % (ref 36.0–46.0)
Hemoglobin: 13.8 g/dL (ref 12.0–15.0)
MCH: 27.3 pg (ref 26.0–34.0)
MCHC: 33.8 g/dL (ref 30.0–36.0)
MCV: 80.6 fL (ref 78.0–100.0)
PLATELETS: 191 10*3/uL (ref 150–400)
RBC: 5.06 MIL/uL (ref 3.87–5.11)
RDW: 14.6 % (ref 11.5–15.5)
WBC: 14.9 10*3/uL — ABNORMAL HIGH (ref 4.0–10.5)

## 2014-04-30 LAB — URINALYSIS, ROUTINE W REFLEX MICROSCOPIC
BILIRUBIN URINE: NEGATIVE
Glucose, UA: 250 mg/dL — AB
KETONES UR: 15 mg/dL — AB
Nitrite: NEGATIVE
Protein, ur: 100 mg/dL — AB
Specific Gravity, Urine: 1.023 (ref 1.005–1.030)
UROBILINOGEN UA: 0.2 mg/dL (ref 0.0–1.0)
pH: 5 (ref 5.0–8.0)

## 2014-04-30 LAB — I-STAT CG4 LACTIC ACID, ED: Lactic Acid, Venous: 2.33 mmol/L — ABNORMAL HIGH (ref 0.5–2.2)

## 2014-04-30 MED ORDER — SODIUM CHLORIDE 0.9 % IV SOLN
Freq: Once | INTRAVENOUS | Status: AC
  Administered 2014-04-30: 10:00:00 via INTRAVENOUS

## 2014-04-30 MED ORDER — ACETAMINOPHEN 325 MG PO TABS: 650.0000 mg | ORAL_TABLET | Freq: Four times a day (QID) | ORAL | Status: DC | PRN

## 2014-04-30 MED ORDER — CIPROFLOXACIN HCL 500 MG PO TABS
500.0000 mg | ORAL_TABLET | Freq: Once | ORAL | Status: AC
  Administered 2014-04-30: 500 mg via ORAL
  Filled 2014-04-30: qty 1

## 2014-04-30 MED ORDER — ACETAMINOPHEN 325 MG PO TABS
650.0000 mg | ORAL_TABLET | Freq: Once | ORAL | Status: AC
  Administered 2014-04-30: 650 mg via ORAL

## 2014-04-30 MED ORDER — ONDANSETRON 4 MG PO TBDP: 4.0000 mg | ORAL_TABLET | Freq: Three times a day (TID) | ORAL | Status: DC | PRN

## 2014-04-30 MED ORDER — SODIUM CHLORIDE 0.9 % IV BOLUS (SEPSIS)
1000.0000 mL | Freq: Once | INTRAVENOUS | Status: AC
  Administered 2014-04-30: 1000 mL via INTRAVENOUS

## 2014-04-30 MED ORDER — DEXTROSE 5 % IV SOLN
1.0000 g | Freq: Once | INTRAVENOUS | Status: AC
  Administered 2014-04-30: 1 g via INTRAVENOUS
  Filled 2014-04-30: qty 10

## 2014-04-30 MED ORDER — ONDANSETRON HCL 4 MG/2ML IJ SOLN
INTRAMUSCULAR | Status: AC
  Filled 2014-04-30: qty 2

## 2014-04-30 MED ORDER — ONDANSETRON HCL 4 MG/2ML IJ SOLN
4.0000 mg | Freq: Once | INTRAMUSCULAR | Status: AC
  Administered 2014-04-30: 4 mg via INTRAVENOUS

## 2014-04-30 MED ORDER — CEFPROZIL 500 MG PO TABS: 500.0000 mg | ORAL_TABLET | Freq: Two times a day (BID) | ORAL | Status: DC

## 2014-04-30 NOTE — ED Provider Notes (Signed)
Pt seen and evaluated.  D/W L. Parker PA.  [facility] being [facility] and started on macrobid by PCP 48 hours ago.  Fever/chills last pm.  Pt with NO Abdominal pain, and no tenderness on exam.  Agree with IVF, IV Abx, and change to PO cipro.  Rolland PorterMark Jennea Rager, MD 04/30/14 1326

## 2014-04-30 NOTE — ED Notes (Signed)
[facility] with a 58 [facility] with fever, nausea and chills for past 2 days.  Pt went to PCP on last Friday and was diagnosed with UTI and was given macrobid.  Pt began having chills and nausea since yesterday.  Pt is tachycardic/Sinus tach.  CBG 230 Temp 101.1.  Tylenol 650 mg and Zofran 4 mg given at Urgent Care.  No pain outside of chronic back issues.  Lungs clear.

## 2014-04-30 NOTE — ED Provider Notes (Signed)
CSN: [id number]     Arrival date & time 04/30/14  1110 History   First MD Initiated Contact with Patient 04/30/14 1113     Chief Complaint  Patient presents with  . Fever  . Chills     (Consider location/radiation/quality/duration/timing/severity/associated sxs/prior Treatment) HPI Comments: The patient is a 58 year old female past medical history of diabetes and thyroid disease sent by EMS from urgent care with a chief complaint of fever and recently diagnosed UTI. The patient reports being diagnosed 2 days ago with a UTI and was prescribed nitrofurantoin in by her PCP, she reports taking her first and only dose yesterday. She reports fever and chills since last night. Patient had documented fever of 101.9 at urgent care was given Tylenol prior to arrival. She denies cough, congestion, diarrhea, rash.  The history is provided by the patient and medical records. No language interpreter was used.    Past Medical History  Diagnosis Date  . Diabetes mellitus   . Thyroid disease   . Depression   . Rash, skin    Past Surgical History  Procedure Laterality Date  . Back surgery    . Partial hysterectomy    . Cholecystectomy  05/30/11   Family History  Problem Relation Age of Onset  . Breast cancer Mother    History  Substance Use Topics  . Smoking status: Never Smoker   . Smokeless tobacco: Not on file  . Alcohol Use: No   OB History   Grav Para Term Preterm Abortions TAB SAB Ect Mult Living                 Review of Systems  Constitutional: Positive for chills.  Respiratory: Negative for cough.   Gastrointestinal: Positive for abdominal pain. Negative for diarrhea.  Genitourinary: Negative for dysuria, urgency and hematuria.  Skin: Negative for rash.      Allergies  Penicillins  Home Medications   Prior to Admission medications   Medication Sig Start Date End Date Taking? Authorizing Provider  glimepiride (AMARYL) 2 MG tablet Take 2 mg by mouth daily before  breakfast.    Historical Provider, MD  levothyroxine (SYNTHROID, LEVOTHROID) 125 MCG tablet Take 125 mcg by mouth daily.      Historical Provider, MD  metFORMIN (GLUMETZA) 500 MG (MOD) 24 hr tablet Take 1,000 mg by mouth 2 (two) times daily.     Historical Provider, MD  naproxen (NAPROSYN) 500 MG tablet Take 1 tablet (500 mg total) by mouth 2 (two) times daily. 02/19/13   Dione Booze, MD  ondansetron (ZOFRAN) 4 MG tablet Take 1 tablet (4 mg total) by mouth every 6 (six) hours as needed for nausea. 02/19/13   Dione Booze, MD  oxyCODONE-acetaminophen (PERCOCET) 5-325 MG per tablet Take 2 tablets by mouth every 4 (four) hours as needed for pain. 02/16/13   Geoffery Lyons, MD  oxyCODONE-acetaminophen (PERCOCET/ROXICET) 5-325 MG per tablet Take 1 tablet by mouth every 4 (four) hours as needed for pain. 02/19/13   Dione Booze, MD  tamsulosin (FLOMAX) 0.4 MG CAPS Take 1 capsule (0.4 mg total) by mouth daily. 02/19/13   Dione Booze, MD   BP 130/53  Pulse 106  Temp(Src) 98.8 F (37.1 C) (Oral)  Resp 17  Ht 5' (1.524 m)  Wt 206 lb (93.441 kg)  BMI 40.23 kg/m2  SpO2 95% Physical Exam  Nursing note and vitals reviewed. Constitutional: She appears well-developed and well-nourished.  Non-toxic appearance. She does not have a sickly appearance. No distress.  HENT:  Head: Normocephalic and atraumatic.  Eyes: EOM are normal. Pupils are equal, round, and reactive to light.  Neck: Neck supple.  Cardiovascular: Regular rhythm.  Tachycardia present.   Pulmonary/Chest: Effort normal and breath sounds normal. She has no wheezes. She has no rales.  Abdominal: Soft. There is tenderness in the suprapubic area. There is no rebound, no guarding and no CVA tenderness.  Musculoskeletal: Normal range of motion.  Skin: Skin is warm and dry. She is not diaphoretic.  Psychiatric: She has a normal mood and affect.    ED Course  Procedures (including critical care time) Labs Review Results for orders placed during the  hospital encounter of 04/30/14  URINALYSIS, ROUTINE W REFLEX MICROSCOPIC      Result Value Ref Range   Color, Urine AMBER (*) YELLOW   APPearance CLOUDY (*) CLEAR   Specific Gravity, Urine 1.023  1.005 - 1.030   pH 5.0  5.0 - 8.0   Glucose, UA 250 (*) NEGATIVE mg/dL   Hgb urine dipstick TRACE (*) NEGATIVE   Bilirubin Urine NEGATIVE  NEGATIVE   Ketones, ur 15 (*) NEGATIVE mg/dL   Protein, ur 161 (*) NEGATIVE mg/dL   Urobilinogen, UA 0.2  0.0 - 1.0 mg/dL   Nitrite NEGATIVE  NEGATIVE   Leukocytes, UA SMALL (*) NEGATIVE  BASIC METABOLIC PANEL      Result Value Ref Range   Sodium 142  137 - 147 mEq/L   Potassium 3.6 (*) 3.7 - 5.3 mEq/L   Chloride 102  96 - 112 mEq/L   CO2 23  19 - 32 mEq/L   Glucose, Bld 202 (*) 70 - 99 mg/dL   BUN 11  6 - 23 mg/dL   Creatinine, Ser 0.96  0.50 - 1.10 mg/dL   Calcium 9.2  8.4 - 04.5 mg/dL   GFR calc non Af Amer >90  >90 mL/min   GFR calc Af Amer >90  >90 mL/min   Anion gap 17 (*) 5 - 15  URINE MICROSCOPIC-ADD ON      Result Value Ref Range   Squamous Epithelial / LPF RARE  RARE   WBC, UA 7-10  <3 WBC/hpf   RBC / HPF 0-2  <3 RBC/hpf  I-STAT CG4 LACTIC ACID, ED      Result Value Ref Range   Lactic Acid, Venous 2.33 (*) 0.5 - 2.2 mmol/L   Imaging Review No results found.   EKG Interpretation None      MDM   Final diagnoses:  UTI (lower urinary tract infection)  Elevated glucose   Per EMR the patient developed a fever at 101.1 at urgent care and was transferred to the ED for further evaluation and treatment.  Patient is nontoxic appearing in emergency department, normal mentation, in no acute distress. Exam shows tachycardia, and lower abdominal pain to palpation. No CVA tenderness. Given that UTI is likely source will treat with ceftriaxone, blood cultures were sent, Zofran and fluids given. 1410 reevaluation patient resting comfortably in room. Reports feeling better and requesting discharge. Pulse is 86 last recorded temperature,  98.6. Discussed lab results, and treatment plan with the patient. Return precautions given. Reports understanding and no other concerns at this time.  Patient is stable for discharge at this time. Filed Vitals:   04/30/14 1425  BP: 108/58  Pulse: 91  Temp:   Resp: 18   Meds given in ED:  Medications  sodium chloride 0.9 % bolus 1,000 mL (0 mLs Intravenous Stopped 04/30/14 1413)  cefTRIAXone (ROCEPHIN) 1  g in dextrose 5 % 50 mL IVPB (0 g Intravenous Stopped 04/30/14 1333)  ciprofloxacin (CIPRO) tablet 500 mg (500 mg Oral Given 04/30/14 1352)    Discharge Medication List as of 04/30/2014  2:17 PM    START taking these medications   Details  acetaminophen (TYLENOL) 325 MG tablet Take 2 tablets (650 mg total) by mouth every 6 (six) hours as needed., Starting 04/30/2014, Until Discontinued, Print    cefPROZIL (CEFZIL) 500 MG tablet Take 1 tablet (500 mg total) by mouth 2 (two) times daily., Starting 04/30/2014, Until Discontinued, Print    ondansetron (ZOFRAN ODT) 4 MG disintegrating tablet Take 1 tablet (4 mg total) by mouth every 8 (eight) hours as needed for nausea or vomiting., Starting 04/30/2014, Until Discontinued, Print              Clabe SealLauren M Roland Prine, PA-C 04/30/14 2040

## 2014-04-30 NOTE — ED Provider Notes (Signed)
CSN: [id number]     Arrival date & time 04/30/14  0945 History   First MD Initiated Contact with Patient 04/30/14 [id number]     Chief Complaint  Patient presents with  . Chills   (Consider location/radiation/quality/duration/timing/severity/associated sxs/prior Treatment) HPI Comments: Patient presents with subjective fever, chill, nausea, anorexia, malaise and lower back pain. States she saw her PCP yesterday and was diagnosed with UTI and prescribed Macrobid. Began taking this medication yesterday but is feeling much worse. Also reports that her blood sugars have been running high.  The history is provided by the patient.    Past Medical History  Diagnosis Date  . Diabetes mellitus   . Thyroid disease   . Depression   . Rash, skin    Past Surgical History  Procedure Laterality Date  . Back surgery    . Partial hysterectomy    . Cholecystectomy  05/30/11   Family History  Problem Relation Age of Onset  . Breast cancer Mother    History  Substance Use Topics  . Smoking status: Never Smoker   . Smokeless tobacco: Not on file  . Alcohol Use: No   OB History   Grav Para Term Preterm Abortions TAB SAB Ect Mult Living                 Review of Systems  Constitutional: Positive for fever, chills, diaphoresis, appetite change and fatigue.  HENT: Negative.   Eyes: Negative.   Respiratory: Negative.   Cardiovascular: Negative.   Gastrointestinal: Positive for nausea. Negative for vomiting, abdominal pain, diarrhea, constipation, blood in stool, abdominal distention, anal bleeding and rectal pain.  Endocrine: Negative.   Genitourinary: Negative.        While patient was diagnosed with UTI, she denies any GU symptoms  Musculoskeletal: Positive for back pain.  Skin: Negative.   Allergic/Immunologic: Negative for immunocompromised state.  Neurological: Negative.     Allergies  Penicillins  Home Medications   Prior to Admission medications   Medication Sig Start Date End  Date Taking? Authorizing Provider  glimepiride (AMARYL) 2 MG tablet Take 2 mg by mouth daily before breakfast.   Yes Historical Provider, MD  levothyroxine (SYNTHROID, LEVOTHROID) 125 MCG tablet Take 125 mcg by mouth daily.     Yes Historical Provider, MD  metFORMIN (GLUMETZA) 500 MG (MOD) 24 hr tablet Take 1,000 mg by mouth 2 (two) times daily.    Yes Historical Provider, MD  naproxen (NAPROSYN) 500 MG tablet Take 1 tablet (500 mg total) by mouth 2 (two) times daily. 02/19/13   Dione Boozeavid Glick, MD  ondansetron (ZOFRAN) 4 MG tablet Take 1 tablet (4 mg total) by mouth every 6 (six) hours as needed for nausea. 02/19/13   Dione Boozeavid Glick, MD  oxyCODONE-acetaminophen (PERCOCET) 5-325 MG per tablet Take 2 tablets by mouth every 4 (four) hours as needed for pain. 02/16/13   Geoffery Lyonsouglas Delo, MD  oxyCODONE-acetaminophen (PERCOCET/ROXICET) 5-325 MG per tablet Take 1 tablet by mouth every 4 (four) hours as needed for pain. 02/19/13   Dione Boozeavid Glick, MD  tamsulosin (FLOMAX) 0.4 MG CAPS Take 1 capsule (0.4 mg total) by mouth daily. 02/19/13   Dione Boozeavid Glick, MD   BP 141/78  Pulse 130  Temp(Src) 99.8 F (37.7 C) (Oral)  Resp 20  SpO2 94% Physical Exam  Nursing note and vitals reviewed. Constitutional: She is oriented to person, place, and time. She appears well-developed and well-nourished.  +overweight +appears unwell   HENT:  Head: Normocephalic and atraumatic.  Eyes: Conjunctivae  are normal. No scleral icterus.  Neck: Normal range of motion. Neck supple.  Cardiovascular: Regular rhythm, normal heart sounds and normal pulses.   No extrasystoles are present. Tachycardia present.   Pulmonary/Chest: Effort normal and breath sounds normal. No respiratory distress. She has no wheezes.  Abdominal: Soft. Normal appearance. She exhibits no distension and no mass. Bowel sounds are decreased. There is no tenderness. There is no rigidity, no rebound, no guarding and no CVA tenderness.  Exam somewhat limited by obese body habitus   Musculoskeletal: Normal range of motion.  Neurological: She is alert and oriented to person, place, and time.  Skin: Skin is warm. She is diaphoretic. There is pallor.  Psychiatric: She has a normal mood and affect. Her behavior is normal.    ED Course  Procedures (including critical care time) Labs Review Labs Reviewed  POCT I-STAT, CHEM 8 - Abnormal; Notable for the following:    Potassium 3.5 (*)    Glucose, Bld 230 (*)    Calcium, Ion 1.03 (*)    Hemoglobin 15.6 (*)    All other components within normal limits  URINE CULTURE  CBC    Imaging Review No results found.   MDM   1. Pyelonephritis    58 y/o ill appearing female with recent diagnosis of UTI, now with appearance concerning for possible early urosepsis. Presents with diaphoresis, tachycardia, nausea and generalized weakness and malaise. No hypotension at [facility]. Developed fever while at [facility] of 101.1 and given tylenol. Patient unable to provide urine sample at [facility]. Istat with moderate hyperglycemia. ECG with sinus tachycardia at 122 bpm and no acute ST/T wave changes or ectopy. CBC pending.  Placed on cardiac monitor, O2 via [place], IV NS established (20g) at right hand, and given 4 mg IV Zofran. Will transfer via EMS to [facility] ER for further evaluation.      Jess Barters [place], [place] 04/30/14 1054

## 2014-04-30 NOTE — ED Notes (Signed)
This writer attempted IV start x 3 w/o success

## 2014-04-30 NOTE — Discharge Instructions (Signed)
Call for a follow up appointment with a Family or Primary Care Provider.  °Return if Symptoms worsen.   °Take medication as prescribed.  °Drink plenty of fluids. °

## 2014-04-30 NOTE — ED Notes (Signed)
C/o she has not felt well since yesterday when she went in to see her regular MD office, who put her on nitrofurantoin for a UT. Had chills last night, id not feel like eating this AM, c/o pain in low back w palpation, pain RUQ w palpation, was told it was her bladder. Did not take her medication this AM . Looks sick

## 2014-05-01 LAB — URINE CULTURE

## 2014-05-01 NOTE — ED Provider Notes (Signed)
Medical screening examination/treatment/procedure(s) were performed by a resident physician or non-physician practitioner and as the supervising physician I was immediately available for consultation/collaboration.  Collyn Ribas, MD    Djibril Glogowski S Mariella Blackwelder, MD 05/01/14 0735 

## 2014-05-04 NOTE — ED Provider Notes (Signed)
Medical screening examination/treatment/procedure(s) were performed by non-physician practitioner and as supervising physician I was immediately available for consultation/collaboration.   EKG Interpretation   Date/Time:  Sunday April 30 2014 11:18:26 EDT Ventricular Rate:  106 PR Interval:  137 QRS Duration: 98 QT Interval:  395 QTC Calculation: 525 R Axis:   -12 Text Interpretation:  Sinus tachycardia Prolonged QT interval ED PHYSICIAN  INTERPRETATION AVAILABLE IN [facility] Confirmed by TEST, Record  ([id number]) on 05/02/2014 7:07:21 AM      Patient presents with UTI. On exam has no tenderness is not tachycardic after treatment. Given IV fluids and antibiotics. She is appropriate for outpatient treatment. Agree with assessment and plan as above.  Rolland PorterMark Melani Brisbane, MD 05/04/14 [phone]

## 2014-05-06 LAB — CULTURE, BLOOD (ROUTINE X 2)
Culture: NO GROWTH
Culture: NO GROWTH

## 2014-08-16 ENCOUNTER — Other Ambulatory Visit (HOSPITAL_COMMUNITY): Payer: Self-pay | Admitting: Obstetrics & Gynecology

## 2014-08-16 DIAGNOSIS — Z1231 Encounter for screening mammogram for malignant neoplasm of breast: Secondary | ICD-10-CM

## 2014-09-25 ENCOUNTER — Ambulatory Visit (HOSPITAL_COMMUNITY)
Admission: RE | Admit: 2014-09-25 | Discharge: 2014-09-25 | Disposition: A | Discharge status: Home / Self Care | Payer: BC Managed Care – PPO | Source: Ambulatory Visit | Attending: Obstetrics & Gynecology | Admitting: Obstetrics & Gynecology

## 2014-09-25 DIAGNOSIS — Z1231 Encounter for screening mammogram for malignant neoplasm of breast: Secondary | ICD-10-CM | POA: Diagnosis present

## 2015-07-08 ENCOUNTER — Emergency Department (HOSPITAL_COMMUNITY): Payer: BLUE CROSS/BLUE SHIELD

## 2015-07-08 ENCOUNTER — Emergency Department (HOSPITAL_COMMUNITY)
Admission: EM | Admit: 2015-07-08 | Discharge: 2015-07-08 | Disposition: A | Discharge status: Home / Self Care | Payer: BLUE CROSS/BLUE SHIELD | Attending: Emergency Medicine | Admitting: Emergency Medicine

## 2015-07-08 ENCOUNTER — Encounter (HOSPITAL_COMMUNITY): Payer: Self-pay | Admitting: Emergency Medicine

## 2015-07-08 DIAGNOSIS — R0600 Dyspnea, unspecified: Secondary | ICD-10-CM | POA: Diagnosis not present

## 2015-07-08 DIAGNOSIS — E119 Type 2 diabetes mellitus without complications: Secondary | ICD-10-CM | POA: Diagnosis not present

## 2015-07-08 DIAGNOSIS — F329 Major depressive disorder, single episode, unspecified: Secondary | ICD-10-CM | POA: Diagnosis not present

## 2015-07-08 DIAGNOSIS — R0602 Shortness of breath: Secondary | ICD-10-CM | POA: Diagnosis present

## 2015-07-08 DIAGNOSIS — R0609 Other forms of dyspnea: Secondary | ICD-10-CM

## 2015-07-08 DIAGNOSIS — Z79899 Other long term (current) drug therapy: Secondary | ICD-10-CM | POA: Insufficient documentation

## 2015-07-08 DIAGNOSIS — Z880 Allergy status to penicillin: Secondary | ICD-10-CM | POA: Insufficient documentation

## 2015-07-08 DIAGNOSIS — E079 Disorder of thyroid, unspecified: Secondary | ICD-10-CM | POA: Diagnosis not present

## 2015-07-08 LAB — CBC WITH DIFFERENTIAL/PLATELET
BASOS ABS: 0 10*3/uL (ref 0.0–0.1)
BASOS PCT: 0 %
EOS ABS: 0.4 10*3/uL (ref 0.0–0.7)
Eosinophils Relative: 4 %
HEMATOCRIT: 43.1 % (ref 36.0–46.0)
Hemoglobin: 13.9 g/dL (ref 12.0–15.0)
Lymphocytes Relative: 34 %
Lymphs Abs: 3.4 10*3/uL (ref 0.7–4.0)
MCH: 26.7 pg (ref 26.0–34.0)
MCHC: 32.3 g/dL (ref 30.0–36.0)
MCV: 82.9 fL (ref 78.0–100.0)
MONO ABS: 0.6 10*3/uL (ref 0.1–1.0)
Monocytes Relative: 6 %
NEUTROS ABS: 5.6 10*3/uL (ref 1.7–7.7)
Neutrophils Relative %: 56 %
PLATELETS: 219 10*3/uL (ref 150–400)
RBC: 5.2 MIL/uL — ABNORMAL HIGH (ref 3.87–5.11)
RDW: 13.9 % (ref 11.5–15.5)
WBC: 10 10*3/uL (ref 4.0–10.5)

## 2015-07-08 LAB — BASIC METABOLIC PANEL
ANION GAP: 11 (ref 5–15)
BUN: 15 mg/dL (ref 6–20)
CALCIUM: 9.8 mg/dL (ref 8.9–10.3)
CO2: 25 mmol/L (ref 22–32)
CREATININE: 0.99 mg/dL (ref 0.44–1.00)
Chloride: 101 mmol/L (ref 101–111)
Glucose, Bld: 131 mg/dL — ABNORMAL HIGH (ref 65–99)
Potassium: 4.1 mmol/L (ref 3.5–5.1)
SODIUM: 137 mmol/L (ref 135–145)

## 2015-07-08 LAB — I-STAT TROPONIN, ED: TROPONIN I, POC: 0 ng/mL (ref 0.00–0.08)

## 2015-07-08 LAB — BRAIN NATRIURETIC PEPTIDE: B NATRIURETIC PEPTIDE 5: 10.4 pg/mL (ref 0.0–100.0)

## 2015-07-08 MED ORDER — ALBUTEROL SULFATE (2.5 MG/3ML) 0.083% IN NEBU
5.0000 mg | INHALATION_SOLUTION | Freq: Once | RESPIRATORY_TRACT | Status: AC
  Administered 2015-07-08: 5 mg via RESPIRATORY_TRACT
  Filled 2015-07-08: qty 6

## 2015-07-08 MED ORDER — ASPIRIN 81 MG PO CHEW
324.0000 mg | CHEWABLE_TABLET | Freq: Once | ORAL | Status: AC
  Administered 2015-07-08: 324 mg via ORAL
  Filled 2015-07-08: qty 4

## 2015-07-08 MED ORDER — PREDNISONE 20 MG PO TABS
40.0000 mg | ORAL_TABLET | Freq: Once | ORAL | Status: AC
  Administered 2015-07-08: 40 mg via ORAL
  Filled 2015-07-08: qty 2

## 2015-07-08 NOTE — ED Provider Notes (Signed)
Complains of dyspnea with exertion for the past 2 weeks accompanied by minimal nonproductive cough. Has been treated with antibiotics without relief. She denies any chest pain nausea or sweatiness. She is asymptomatic as I examine her. On exam alert no distress lungs clear to auscultation heart regular rate and rhythm no murmurs abdomen obese nontender extremities without edema. Skin warm dry. Chest x-ray reviewed by me Patient may have bronchitis or viral illness. Also need to consider anginal equivalent, doubtful with normal EKG negative troponin and or cardiomyopathy  Doug Sou, MD 07/08/15 1533

## 2015-07-08 NOTE — ED Notes (Signed)
Pt from home for increased "discomfort when breathing", pt states has been with a cold x a week was given antibiotics by PCP but states is not helping. Pt denies being a smoker or productive cough. No increased edema noted. Pt lung sounds clear, able to communicate in complete sentences at this time. nad noted.

## 2015-07-08 NOTE — ED Notes (Signed)
Pt ambulated in the hallway on pulse ox, o2 97 heart rate 88.

## 2015-07-08 NOTE — ED Provider Notes (Signed)
CSN: [id number]     Arrival date & time 07/08/15  1209 History   First MD Initiated Contact with Patient 07/08/15 1218     Chief Complaint  Patient presents with  . Shortness of Breath  . URI     (Consider location/radiation/quality/duration/timing/severity/associated sxs/prior Treatment) Patient is a 59 y.o. female presenting with URI. The history is provided by the patient.  URI    Robin Bonilla is a 59 year old female with history of diabetes, depression and hypothyroid, who presents to the emergency department at the urging of her PCP who evaluated her this morning. She has been complaining of URI symptoms with nasal congestion, chest congestion and cough for the past 3 weeks. Roughly 3 weeks ago the onset of her cough cold and congestion symptoms she was treated with a Z-Pak, without any improvement. She continued to feel unwell, was unable to work, return to her PCP 5 days ago for further evaluation. She reports that they did a chest x-ray which was negative for any pneumonia, she was given moxifloxacin, albuterol and received a steroidal injection, and still did not feel better.  Last night she was unable to sleep due to her chest discomfort, she states she return to her PCP this morning and he instructed her to come immediately to the emergency department for cardiac evaluation. She complains of persistent nonproductive cough, generalized fatigue, exertional dyspnea, one pillow orthopnea and chest "discomfort."  Her chest pain is located across her central chest, without radiation, described as a soreness, rated 3 out of 10, reproduced with palpation to her chest wall, and not exacerbated by positional changes, deep inspiration or cough. She also complains of loss of appetite, nausea, intermittent diarrhea, and sweats. She denies fever, vomiting, chills, palpitations, lower external edema, weight gain.  Past Medical History  Diagnosis Date  . Diabetes mellitus   . Thyroid disease   .  Depression   . Rash, skin    Past Surgical History  Procedure Laterality Date  . Back surgery    . Partial hysterectomy    . Cholecystectomy  05/30/11   Family History  Problem Relation Age of Onset  . Breast cancer Mother    Social History  Substance Use Topics  . Smoking status: Never Smoker   . Smokeless tobacco: None  . Alcohol Use: No   OB History    No data available     Review of Systems  Constitutional: Positive for activity change and appetite change.  Respiratory: Negative for apnea, choking, chest tightness and stridor.   Cardiovascular: Negative for palpitations and leg swelling.  Gastrointestinal: Positive for diarrhea. Negative for nausea, constipation, blood in stool and abdominal distention.  Endocrine: Negative.   Genitourinary: Negative.   Musculoskeletal: Negative.   Skin: Negative.  Negative for pallor.  Neurological: Negative for dizziness, tremors, syncope and light-headedness.  Psychiatric/Behavioral: Negative.     Allergies  Penicillins  Home Medications   Prior to Admission medications   Medication Sig Start Date End Date Taking? Authorizing Provider  citalopram (CELEXA) 10 MG tablet Take 10 mg by mouth daily.   Yes Historical Provider, MD  glimepiride (AMARYL) 4 MG tablet Take 4 mg by mouth daily with breakfast.   Yes Historical Provider, MD  levothyroxine (SYNTHROID, LEVOTHROID) 137 MCG tablet Take 137 mcg by mouth daily before breakfast.   Yes Historical Provider, MD  metFORMIN (GLUMETZA) 500 MG (MOD) 24 hr tablet Take 1,000 mg by mouth 2 (two) times daily.    Yes Historical Provider, MD  moxifloxacin (AVELOX) 400 MG tablet Take 400 mg by mouth daily at 8 pm. For 10 days. Started on 07-03-15   Yes Historical Provider, MD  acetaminophen (TYLENOL) 325 MG tablet Take 2 tablets (650 mg total) by mouth every 6 (six) hours as needed. 04/30/14   Mellody Drown, PA-C  glimepiride (AMARYL) 2 MG tablet Take 2 mg by mouth daily before breakfast.     Historical Provider, MD  levothyroxine (SYNTHROID, LEVOTHROID) 125 MCG tablet Take 125 mcg by mouth daily.      Historical Provider, MD   BP 116/66 mmHg  Pulse 85  Temp(Src) 98.3 F (36.8 C) (Oral)  Resp 18  Ht 5' (1.524 m)  Wt 194 lb (87.998 kg)  BMI 37.89 kg/m2  SpO2 96% Physical Exam  Constitutional: She is oriented to person, place, and time. She appears well-developed and well-nourished. No distress.  HENT:  Head: Normocephalic and atraumatic.  Right Ear: External ear normal.  Left Ear: External ear normal.  Nose: Nose normal.  Mouth/Throat: Oropharynx is clear and moist. No oropharyngeal exudate.  Eyes: Conjunctivae and EOM are normal. Pupils are equal, round, and reactive to light. Right eye exhibits no discharge. Left eye exhibits no discharge. No scleral icterus.  Neck: Normal range of motion. No JVD present. No tracheal deviation present. No thyromegaly present.  Cardiovascular: Normal rate, regular rhythm and intact distal pulses.  Exam reveals gallop. Exam reveals no friction rub.   No murmur heard. No lower extremity pitting edema  Pulmonary/Chest: Effort normal and breath sounds normal. No respiratory distress. She has no wheezes. She has no rales. She exhibits tenderness.  Abdominal: Soft. Bowel sounds are normal. She exhibits no distension and no mass. There is no tenderness. There is no rebound and no guarding.  Musculoskeletal: Normal range of motion. She exhibits no edema or tenderness.  Lymphadenopathy:    She has no cervical adenopathy.  Neurological: She is alert and oriented to person, place, and time. She has normal reflexes. No cranial nerve deficit. She exhibits normal muscle tone. Coordination normal.  Skin: Skin is warm and dry. No rash noted. She is not diaphoretic. No erythema. No pallor.  Psychiatric: She has a normal mood and affect. Her behavior is normal. Judgment and thought content normal.  Nursing note and vitals reviewed.   ED Course   Procedures (including critical care time) Labs Review Labs Reviewed  CBC WITH DIFFERENTIAL/PLATELET - Abnormal; Notable for the following:    RBC 5.20 (*)    All other components within normal limits  BASIC METABOLIC PANEL - Abnormal; Notable for the following:    Glucose, Bld 131 (*)    All other components within normal limits  BRAIN NATRIURETIC PEPTIDE  I-STAT TROPOININ, ED    Imaging Review Dg Chest 2 View  07/08/2015   CLINICAL DATA:  Shortness of breath and cough.  EXAM: CHEST - 2 VIEW  COMPARISON:  Prior study on 07/03/2015 at [facility]  FINDINGS: The heart size and mediastinal contours are within normal limits. There is no evidence of pulmonary edema, consolidation, pneumothorax, nodule or pleural fluid. The thoracic spine shows multilevel spondylosis.  IMPRESSION: No active disease.   Electronically Signed   By: Irish Lack M.D.   On: 07/08/2015 13:26   I have personally reviewed and evaluated these images and lab results as part of my medical decision-making.   EKG Interpretation None        Date: 07/08/2015  Rate: 90  Rhythm: normal sinus rhythm  QRS Axis:  normal  Intervals: normal  ST/T Wave abnormalities: normal  Conduction Disutrbances: none  Narrative Interpretation:   Old EKG Reviewed: No significant changes noted Compared to 04/30/2014 EKG     MDM   Final diagnoses:  Exertional dyspnea    Patient was shortness of breath, cough and recent URI She was sent here by her PCP for emergent cardiac evaluation, he believes her lingering chest discomfort, shortness of breath and generalized fatigue may be cardiac  The patient's labs were unremarkable.  Chest x-ray was negative for infiltrate or pulmonary edema BNP was negative.  Patient's chest pain was reproducible with palpation to her anterior chest wall. Her EKG was normal sinus rhythm, without T-wave inversion or ST elevation. Troponin was negative. Her lungs were clear on exam, she had no  lower extremity edema, she had no tachypnea, was not dyspneic, and had infrequent cough.  Her blood pressure was normal, she was afebrile, without tachycardia.  She ambulated in the ER with pulse ox with a heart rate of 88 and maintained her oxygen saturation at 97%.  The patient was seen and assured visit with Dr. Ethelda Chick. Cardiology was paged to establish follow-up for further outpatient testing.  I received no call back for an hour and a half, 2 secretaries were asked to re-page.  The patient wished to go home and call cardiology in the morning.  I have routed a message to cardiology, requesting a follow-up appointment.  I will follow up on it tomorrow morning.  The patient was comfortable with discharging home.   She requested a work note for several days, she was given a work note for 2 days.  She was instructed to follow-up with her primary care provider if she needed further medical documentation for missing work.    She discharged home stable vitals, and in satisfactory condition. She was advised to continue her prescribed medications.  Return precautions were given patient and her husband verbalize understanding.  Filed Vitals:   07/08/15 1545 07/08/15 1615 07/08/15 1645 07/08/15 1653  BP: 103/64 118/67 116/66 116/66  Pulse: 80 80 78 85  Temp:      TempSrc:      Resp: Height:      Weight:      SpO2: 96% 96% 95% 96%   Medications  albuterol (PROVENTIL) (2.5 MG/3ML) 0.083% nebulizer solution 5 mg (5 mg Nebulization Given 07/08/15 1224)  predniSONE (DELTASONE) tablet 40 mg (40 mg Oral Given 07/08/15 1333)  aspirin chewable tablet 324 mg (324 mg Oral Given 07/08/15 1333)     Danelle Berry, PA-C 07/08/15 1801  Doug Sou, MD 07/09/15 1712

## 2015-07-08 NOTE — Discharge Instructions (Signed)

## 2015-07-08 NOTE — ED Notes (Signed)
Pt transported to xray 

## 2015-07-08 NOTE — ED Notes (Signed)
Pt c/o cold symptoms ongoing for a couple of weeks, pt has been seen by her PMD and given medications without relief.

## 2015-07-30 ENCOUNTER — Telehealth: Payer: Self-pay | Admitting: Cardiology

## 2015-07-30 NOTE — Telephone Encounter (Signed)
[facility] records rec placed in chart prep bin for 08/08/15 appointment with Dr.Nelson

## 2015-08-08 ENCOUNTER — Ambulatory Visit (INDEPENDENT_AMBULATORY_CARE_PROVIDER_SITE_OTHER): Payer: BLUE CROSS/BLUE SHIELD | Admitting: Cardiology

## 2015-08-08 ENCOUNTER — Encounter: Payer: Self-pay | Admitting: Cardiology

## 2015-08-08 VITALS — BP 122/82 | HR 74 | Ht 60.0 in | Wt 194.0 lb

## 2015-08-08 DIAGNOSIS — I10 Essential (primary) hypertension: Secondary | ICD-10-CM

## 2015-08-08 DIAGNOSIS — R0609 Other forms of dyspnea: Secondary | ICD-10-CM | POA: Insufficient documentation

## 2015-08-08 DIAGNOSIS — E785 Hyperlipidemia, unspecified: Secondary | ICD-10-CM

## 2015-08-08 DIAGNOSIS — R0600 Dyspnea, unspecified: Secondary | ICD-10-CM

## 2015-08-08 MED ORDER — PRAVASTATIN SODIUM 10 MG PO TABS: 10.0000 mg | ORAL_TABLET | Freq: Every day | ORAL | Status: DC

## 2015-08-08 NOTE — Progress Notes (Signed)
Patient ID: Robin Bonilla, female   DOB: [birth year], 59 y.o.   MRN: [id number]      Cardiology Office Note  Date:  08/09/2015   ID:  Robin Bonilla, DOB 14-May-1956, MRN [id number]  PCP:  Blane Ohara, MD  Cardiologist: Lars Masson, MD   Chief complain: DOE   History of Present Illness: Robin Bonilla is a 59 y.o. female who presents for evaluation of dyspnea on exertion. She has h/o DM, hyperlipidemia, intolerant to simvastatin who developed chronic cough earlier this year,  That was associated with chest tightness. She was treated for an acute sinusitis and her symptoms improved and so did the chest pain. She has persistent worsening DOE and reduced physical capacity.  She has known hyperlipidemia but is off statins as she didn't tolerate simvastatin.  Labs: Crea 0.8,  TG: 183 LDL: 106 HDL: 31  Past Medical History  Diagnosis Date  . Diabetes mellitus   . Thyroid disease   . Depression   . Rash, skin   . Hirsutism   . Hyperlipidemia   . Dysthymic disorder   . Acute sinusitis    Past Surgical History  Procedure Laterality Date  . Back surgery    . Partial hysterectomy    . Cholecystectomy  05/30/11   Current Outpatient Prescriptions  Medication Sig Dispense Refill  . citalopram (CELEXA) 10 MG tablet Take 10 mg by mouth daily.    Marland Kitchen glimepiride (AMARYL) 4 MG tablet Take 1 tablet by mouth daily.  4  . levothyroxine (SYNTHROID, LEVOTHROID) 137 MCG tablet Take 137 mcg by mouth daily before breakfast.    . metFORMIN (GLUMETZA) 500 MG (MOD) 24 hr tablet Take 1,000 mg by mouth 2 (two) times daily.     . pravastatin (PRAVACHOL) 10 MG tablet Take 1 tablet (10 mg total) by mouth daily. 90 tablet 3   No current facility-administered medications for this visit.   Allergies:   Penicillins and Simvastatin   Social History:  The patient  reports that she has never smoked. She does not have any smokeless tobacco history on file. She reports that she does not drink alcohol or use  illicit drugs.   Family History:  The patient's family history includes Breast cancer in her mother; CAD in her father and maternal grandmother; Diabetes in her paternal aunt and paternal grandfather; Hypertension in her maternal grandmother.   ROS:  Please see the history of present illness.   Otherwise, review of systems are positive for none.   All other systems are reviewed and negative.   PHYSICAL EXAM: VS:  BP 122/82 mmHg  Pulse 74  Ht 5' (1.524 m)  Wt 194 lb (87.998 kg)  BMI 37.89 kg/m2 , BMI Body mass index is 37.89 kg/(m^2). GEN: Well nourished, well developed, in no acute distress HEENT: normal Neck: no JVD, carotid bruits, or masses Cardiac: RRR; no murmurs, rubs, or gallops,no edema  Respiratory:  clear to auscultation bilaterally, normal work of breathing GI: soft, nontender, nondistended, + BS MS: no deformity or atrophy Skin: warm and dry, no rash Neuro:  Strength and sensation are intact Psych: euthymic mood, full affect  EKG:  EKG is ordered today. The ekg ordered today demonstrates SR, normal ECG.  Recent Labs: 07/08/2015: B Natriuretic Peptide 10.4; BUN 15; Creatinine, Ser 0.99; Hemoglobin 13.9; Platelets 219; Potassium 4.1; Sodium 137  Lipid Panel No results found for: CHOL, TRIG, HDL, CHOLHDL, VLDL, LDLCALC, LDLDIRECT   Wt Readings from Last 3 Encounters:  08/08/15 194 lb (87.998 kg)  07/08/15 194 lb (87.998 kg)  04/30/14 206 lb (93.441 kg)      ASSESSMENT AND PLAN:  1.  DOE -  Schedule a Lexiscan nuclear stress test  2. Hyperlipidemia - start Pravastatin 10 mg po daily.  Check CMP and lipids in 2 months.    Orders Placed This Encounter  Procedures  . Myocardial Perfusion Imaging  . EKG 12-Lead   If normal stress test follow up in 1 year  Signed, Lars MassonELSON, Ermalee Mealy H, MD  08/09/2015 9:49 AM    [facility] [address], [place], [place]  [address] Phone: [phone]) [phone]; Fax: [phone]

## 2015-08-08 NOTE — Patient Instructions (Signed)
Medication Instructions:   START TAKING PRAVASTATIN 10 MG ONCE DAILY      Testing/Procedures:  Your physician has requested that you have a lexiscan myoview. For further information please visit [email or web address]. Please follow instruction sheet, as given.     Follow-Up:  Your physician wants you to follow-up in: 1 YEAR WITH DR Johnell ComingsNELSON You will receive a reminder letter in the mail two months in advance. If you don't receive a letter, please call our office to schedule the follow-up appointment.        If you need a refill on your cardiac medications before your next appointment, please call your pharmacy.

## 2015-08-14 ENCOUNTER — Telehealth (HOSPITAL_COMMUNITY): Payer: Self-pay | Admitting: *Deleted

## 2015-08-14 NOTE — Telephone Encounter (Signed)
Patient attempted to call and unable to leave message on home #,left message at work for patient to call back. [facility] returned [facility] call.Patient given detailed instructions per Myocardial Perfusion Study Information Sheet for the test on 08/16/15 at 0730. Patient notified to arrive 15 minutes early and that it is imperative to arrive on time for appointment to keep from having the test rescheduled.  If you need to cancel or reschedule your appointment, please call the office within 24 hours of your appointment. Failure to do so may result in a cancellation of your appointment, and a $50 no show fee. Patient verbalized understanding.Kahliyah Dick, Adelene IdlerCynthia W

## 2015-08-16 ENCOUNTER — Ambulatory Visit (HOSPITAL_COMMUNITY): Payer: BLUE CROSS/BLUE SHIELD | Attending: Cardiovascular Disease

## 2015-08-16 DIAGNOSIS — R0600 Dyspnea, unspecified: Secondary | ICD-10-CM

## 2015-08-16 DIAGNOSIS — R079 Chest pain, unspecified: Secondary | ICD-10-CM | POA: Insufficient documentation

## 2015-08-16 DIAGNOSIS — E119 Type 2 diabetes mellitus without complications: Secondary | ICD-10-CM | POA: Insufficient documentation

## 2015-08-16 DIAGNOSIS — R0609 Other forms of dyspnea: Secondary | ICD-10-CM | POA: Diagnosis not present

## 2015-08-16 LAB — MYOCARDIAL PERFUSION IMAGING
LV dias vol: 68 mL
LV sys vol: 22 mL
Peak HR: 109 {beats}/min
RATE: 0.21
Rest HR: 64 {beats}/min
SDS: 0
SRS: 0
SSS: 0
TID: 1.01

## 2015-08-16 MED ORDER — TECHNETIUM TC 99M SESTAMIBI GENERIC - CARDIOLITE
32.4000 | Freq: Once | INTRAVENOUS | Status: AC | PRN
  Administered 2015-08-16: 32 via INTRAVENOUS

## 2015-08-16 MED ORDER — REGADENOSON 0.4 MG/5ML IV SOLN
0.4000 mg | Freq: Once | INTRAVENOUS | Status: AC
  Administered 2015-08-16: 0.4 mg via INTRAVENOUS

## 2015-08-16 MED ORDER — TECHNETIUM TC 99M SESTAMIBI GENERIC - CARDIOLITE
11.0000 | Freq: Once | INTRAVENOUS | Status: AC | PRN
  Administered 2015-08-16: 11 via INTRAVENOUS

## 2015-08-21 ENCOUNTER — Telehealth: Payer: Self-pay | Admitting: *Deleted

## 2015-08-21 DIAGNOSIS — R0602 Shortness of breath: Secondary | ICD-10-CM | POA: Insufficient documentation

## 2015-08-21 DIAGNOSIS — R0600 Dyspnea, unspecified: Secondary | ICD-10-CM

## 2015-08-21 DIAGNOSIS — R0609 Other forms of dyspnea: Principal | ICD-10-CM

## 2015-08-21 NOTE — Telephone Encounter (Signed)
Notes Recorded by Lars MassonKatarina H Nelson, MD on 08/16/2015 at 5:24 PM I would suggest that she sees a pulmonary doctor to further evaluate. Weight loss is also recommended.  Notified the pt that per Dr Delton SeeNelson, based on her stress test being normal and the pt having continuous sob, she recommends that we refer her to pulmonology for further evaluation, and advises the pt to lose weight.  Informed the pt that I will place this referral in the system, and have someone from [facility] call her to have this referral scheduled.  Pt verbalized understanding and agrees with this plan.

## 2015-09-04 ENCOUNTER — Other Ambulatory Visit: Payer: Self-pay

## 2015-09-04 DIAGNOSIS — Z1231 Encounter for screening mammogram for malignant neoplasm of breast: Secondary | ICD-10-CM

## 2015-09-27 ENCOUNTER — Ambulatory Visit
Admission: RE | Admit: 2015-09-27 | Discharge: 2015-09-27 | Disposition: A | Discharge status: Home / Self Care | Payer: BLUE CROSS/BLUE SHIELD | Source: Ambulatory Visit

## 2015-09-27 DIAGNOSIS — Z1231 Encounter for screening mammogram for malignant neoplasm of breast: Secondary | ICD-10-CM

## 2015-10-25 ENCOUNTER — Telehealth: Payer: Self-pay | Admitting: Cardiology

## 2015-10-25 NOTE — Telephone Encounter (Signed)
New message     Patient recent had stress test still having trouble what to do next

## 2015-10-25 NOTE — Telephone Encounter (Signed)
Pt calling back to inform Dr Delton See and myself that after she had her stress test for her SOB and DOE, and Dr Delton See cleared her from a cardiac perspective, and then referred her to [facility], for further eval of continuous SOB and DOE.  This referral was placed in 08/21/15.  Per the pt she has not heard anything from them at all about having an appt scheduled to become an established pt.  Pt states that she is just now calling to inform us of this, for she currently has a URI and went to see her PCP yesterday, and he highly recommended that she follow-up with a Pulmonologist for her SOB and DOE.  Informed the pt that the referral is in the system and our schedulers sent this to [facility].  Pt states they may have called her at some point, and she missed the call.  Pt states she was out of town for several weeks in December. Pt would like to proceed with having this referral set up again, for she's still having the same continuous symptoms. Informed the pt that I will follow-up on this by sending our [facility] schedulers a message about this referral and to check on the status of why this was never scheduled.  Informed the pt that someone from our office will call her back in regards to this referral.  Pt verbalized understanding and agrees with this plan. Pt gracious for all the assistance provided.

## 2015-10-25 NOTE — Telephone Encounter (Signed)
Pt was contacted and scheduled as mentioned below:  RE: referral to [facility] schedule for 11-13-15 with Ramaswamy pt is aware.

## 2015-11-13 ENCOUNTER — Encounter: Payer: Self-pay | Admitting: Internal Medicine

## 2015-11-13 ENCOUNTER — Other Ambulatory Visit: Payer: BLUE CROSS/BLUE SHIELD

## 2015-11-13 ENCOUNTER — Ambulatory Visit (INDEPENDENT_AMBULATORY_CARE_PROVIDER_SITE_OTHER): Payer: BLUE CROSS/BLUE SHIELD | Admitting: Internal Medicine

## 2015-11-13 VITALS — BP 138/80 | HR 107 | Ht 60.0 in | Wt 198.6 lb

## 2015-11-13 DIAGNOSIS — R0689 Other abnormalities of breathing: Secondary | ICD-10-CM | POA: Diagnosis not present

## 2015-11-13 DIAGNOSIS — R0600 Dyspnea, unspecified: Secondary | ICD-10-CM

## 2015-11-13 LAB — NITRIC OXIDE: Nitric Oxide: 11

## 2015-11-13 NOTE — Addendum Note (Signed)
Addended by: Sheran Luz on: 11/13/2015 05:08 PM   Modules accepted: Orders

## 2015-11-13 NOTE — Patient Instructions (Addendum)
ICD-9-CM ICD-10-CM   1. Dyspnea and respiratory abnormality 786.09 R06.00     R06.89    Unclear cause of shortness of breath. Doubt asthma because of normal FeNO Statistically - ruled out ILD and PE and if negative, most likely due to diastolic dysfunction, obesity, deconditioning  PLAN  - d0 -dimer blood test -> CT scan depending on this result  - full PFT  - based on results of above -> if inconclusive will do bike stress test for lung

## 2015-11-13 NOTE — Progress Notes (Signed)
Subjective:    Patient ID: Robin Bonilla, female    DOB: 10/06/1955, 60 y.o.   MRN: [id number]    HPI  IOV 11/13/2015  Chief Complaint  Patient presents with  . Pulmonary Consult    Pt referred by Dr. Delton See for SOB with activty x 3 months and chest tightness when SOB - resolves with rest.  Pt denies cough. Pt had a stress test in 07/2015.     60 year old lady with a body mass index of 38. She does not have any weight change at baseline. 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 She's never had any problems with dyspnea up until October 2016. Since then she's had insidious onset of dyspnea not associated with weight change. Dyspnea happens at rest and with exertion but always consistently with exertion. Rest makes it better. One time when she went to the emergency room oxygen and nebulizers helped her. Occasionally dyspnea is associated with a mild dry cough. Severe to symptoms as moderate and it is stable since onset. Review of cardiology notes indicate she had a clear chest x-ray that I personally visualized. In 2014 she had a CT abdomen lung cut that I personally visualized and does not show interstitial lung disease. She had a cardiac stress test by Dr. Delton See myocardial perfusion 08/16/2015 that was normal. Hemoglobin 13 g percent in October 2016. There is no associated wheezing. No orthopnea or paroxysmal nocturnal dyspnea. No travel no leg edema. There is some associated chest tightness that is mild.  Walking desaturation test 185 feet 3 laps on room air:  NO DESATS  Exhaled NO is 11 and not c/w eos asthma    has a past medical history of Diabetes mellitus; Thyroid disease; Depression; Rash, skin; Hirsutism; Hyperlipidemia; Dysthymic disorder; and Acute sinusitis.   reports that she has never smoked. She has never used smokeless tobacco.  Past Surgical History  Procedure Laterality Date  . Back surgery    . Partial hysterectomy     . Cholecystectomy  05/30/11  . Tubal ligation    . Tonsillectomy      Allergies  Allergen Reactions  . Penicillins Rash  . Simvastatin Other (See Comments)    nightmares     Immunization History  Administered Date(s) Administered  . Influenza,inj,Quad PF,36+ Mos 06/30/2015    Family History  Problem Relation Age of Onset  . Breast cancer Mother   . CAD Father   . CAD Maternal Grandmother   . Diabetes Paternal Grandfather   . Diabetes Paternal Aunt   . Hypertension Maternal Grandmother      Current outpatient prescriptions:  .  citalopram (CELEXA) 10 MG tablet, Take 10 mg by mouth daily., Disp: , Rfl:  .  glimepiride (AMARYL) 4 MG tablet, Take 1 tablet by mouth daily., Disp: , Rfl: 4 .  levothyroxine (SYNTHROID, LEVOTHROID) 137 MCG tablet, Take 137 mcg by mouth daily before breakfast., Disp: , Rfl:  .  metFORMIN (GLUMETZA) 500 MG (MOD) 24 hr tablet, Take 1,000 mg by mouth 2 (two) times daily. , Disp: , Rfl:    Review of Systems  Constitutional: Negative for fever and unexpected weight change.  HENT: Negative for congestion, dental problem, ear pain, nosebleeds, postnasal drip, rhinorrhea, sinus pressure, sneezing, sore throat and trouble swallowing.   Eyes: Negative for redness and itching.  Respiratory: Positive for shortness of breath. Negative for cough, chest tightness and wheezing.   Cardiovascular: Negative for palpitations and  leg swelling.  Gastrointestinal: Negative for nausea and vomiting.  Genitourinary: Negative for dysuria.  Musculoskeletal: Negative for joint swelling.  Skin: Negative for rash.  Neurological: Negative for headaches.  Hematological: Does not bruise/bleed easily.  Psychiatric/Behavioral: Negative for dysphoric mood. The patient is not nervous/anxious.        Objective:   Physical Exam  Constitutional: She is oriented to person, place, and time. She appears well-developed and well-nourished. No distress.  obese  HENT:  Head:  Normocephalic and atraumatic.  Right Ear: External ear normal.  Left Ear: External ear normal.  Mouth/Throat: Oropharynx is clear and moist. No oropharyngeal exudate.  Hirsute in face  Eyes: Conjunctivae and EOM are normal. Pupils are equal, round, and reactive to light. Right eye exhibits no discharge. Left eye exhibits no discharge. No scleral icterus.  Neck: Normal range of motion. Neck supple. No JVD present. No tracheal deviation present. No thyromegaly present.  Cardiovascular: Normal rate, regular rhythm, normal heart sounds and intact distal pulses.  Exam reveals no gallop and no friction rub.   No murmur heard. Pulmonary/Chest: Effort normal and breath sounds normal. No respiratory distress. She has no wheezes. She has no rales. She exhibits no tenderness.  Abdominal: Soft. Bowel sounds are normal. She exhibits no distension and no mass. There is no tenderness. There is no rebound and no guarding.  Visceral obesity +  Musculoskeletal: Normal range of motion. She exhibits no edema or tenderness.  Midline low back surgical scar   Lymphadenopathy:    She has no cervical adenopathy.  Neurological: She is alert and oriented to person, place, and time. She has normal reflexes. No cranial nerve deficit. She exhibits normal muscle tone. Coordination normal.  Skin: Skin is warm and dry. No rash noted. She is not diaphoretic. No erythema. No pallor.  Psychiatric: She has a normal mood and affect. Her behavior is normal. Judgment and thought content normal.  Vitals reviewed.  Filed Vitals:   11/13/15 1336  BP: 138/80  Pulse: 107  Height: 5' (1.524 m)  Weight: 198 lb 9.6 oz (90.084 kg)  SpO2: 98%    Estimated body mass index is 38.79 kg/(m^2) as calculated from the following:   Height as of this encounter: 5' (1.524 m).   Weight as of this encounter: 198 lb 9.6 oz (90.084 kg).         Assessment & Plan:  1. Dyspnea and respiratory abnormality   Unclear cause of shortness of  breath. Doubt asthma because of normal FeNO Statistically - ruled out ILD and PE and if negative, most likely due to diastolic dysfunction, obesity, deconditioning  PLAN  - d0 -dimer blood test -> CT scan depending on this result  - full PFT  - based on results of above -> if inconclusive will do bike stress test for lung   Dr. Kalman Shan, M.D., [facility].C.P Pulmonary and Critical Care Medicine Staff Physician [place] [facility] [place] Pulmonary and Critical Care Pager: [phone], If no answer or between  15:00h - 7:00h: call [phone]  11/13/2015 2:15 PM

## 2015-11-14 ENCOUNTER — Telehealth: Payer: Self-pay | Admitting: Internal Medicine

## 2015-11-14 DIAGNOSIS — R7989 Other specified abnormal findings of blood chemistry: Secondary | ICD-10-CM

## 2015-11-14 DIAGNOSIS — R0600 Dyspnea, unspecified: Secondary | ICD-10-CM

## 2015-11-14 DIAGNOSIS — R0609 Other forms of dyspnea: Principal | ICD-10-CM

## 2015-11-14 LAB — D-DIMER, QUANTITATIVE: D-Dimer, Quant: 0.5 ug/mL-FEU — ABNORMAL HIGH (ref 0.00–0.48)

## 2015-11-14 NOTE — Telephone Encounter (Signed)
ATC pt on home phone - no VM available.  ATC work number and placed on long hold. WCB.

## 2015-11-14 NOTE — Telephone Encounter (Signed)
Dimer slight high so please get HRCT wo contrast for rule out ILD followed by CTA rule out PE

## 2015-11-15 NOTE — Telephone Encounter (Signed)
Called and spoke to pt. Informed her of the results and recs per MR. Orders placed. Pt verbalized understanding and denied any further questions or concerns at this time.   

## 2015-11-15 NOTE — Telephone Encounter (Signed)
ATC pt on home phone  - no VM available LMOM at work number TCB x1.

## 2015-11-16 ENCOUNTER — Ambulatory Visit (INDEPENDENT_AMBULATORY_CARE_PROVIDER_SITE_OTHER)
Admission: RE | Admit: 2015-11-16 | Discharge: 2015-11-16 | Disposition: A | Discharge status: Home / Self Care | Payer: BLUE CROSS/BLUE SHIELD | Source: Ambulatory Visit | Attending: Internal Medicine | Admitting: Internal Medicine

## 2015-11-16 ENCOUNTER — Other Ambulatory Visit (INDEPENDENT_AMBULATORY_CARE_PROVIDER_SITE_OTHER): Payer: BLUE CROSS/BLUE SHIELD

## 2015-11-16 ENCOUNTER — Ambulatory Visit (HOSPITAL_COMMUNITY)
Admission: RE | Admit: 2015-11-16 | Discharge: 2015-11-16 | Disposition: A | Discharge status: Home / Self Care | Payer: BLUE CROSS/BLUE SHIELD | Source: Ambulatory Visit | Attending: Internal Medicine | Admitting: Internal Medicine

## 2015-11-16 ENCOUNTER — Telehealth: Payer: Self-pay | Admitting: Internal Medicine

## 2015-11-16 DIAGNOSIS — R0689 Other abnormalities of breathing: Secondary | ICD-10-CM | POA: Diagnosis not present

## 2015-11-16 DIAGNOSIS — R0600 Dyspnea, unspecified: Secondary | ICD-10-CM | POA: Insufficient documentation

## 2015-11-16 DIAGNOSIS — R7989 Other specified abnormal findings of blood chemistry: Secondary | ICD-10-CM

## 2015-11-16 DIAGNOSIS — R791 Abnormal coagulation profile: Secondary | ICD-10-CM | POA: Diagnosis not present

## 2015-11-16 DIAGNOSIS — J679 Hypersensitivity pneumonitis due to unspecified organic dust: Secondary | ICD-10-CM

## 2015-11-16 DIAGNOSIS — R0609 Other forms of dyspnea: Secondary | ICD-10-CM

## 2015-11-16 DIAGNOSIS — R9389 Abnormal findings on diagnostic imaging of other specified body structures: Secondary | ICD-10-CM

## 2015-11-16 LAB — BASIC METABOLIC PANEL
BUN: 17 mg/dL (ref 6–23)
CO2: 30 mEq/L (ref 19–32)
Calcium: 9.6 mg/dL (ref 8.4–10.5)
Chloride: 103 mEq/L (ref 96–112)
Creatinine, Ser: 0.88 mg/dL (ref 0.40–1.20)
GFR: 70.16 mL/min (ref >60.00)
Glucose, Bld: 170 mg/dL — ABNORMAL HIGH (ref 70–99)
Potassium: 3.9 mEq/L (ref 3.5–5.1)
Sodium: 139 mEq/L (ref 135–145)

## 2015-11-16 LAB — PULMONARY FUNCTION TEST
DL/VA % pred: 119 %
DL/VA: 5.08 ml/min/mmHg/L
DLCO UNC % PRED: 103 %
DLCO UNC: 19.51 ml/min/mmHg
FEF 25-75 PRE: 2.67 L/s
FEF 25-75 Post: 2.94 L/sec
FEF2575-%Change-Post: 10 %
FEF2575-%PRED-PRE: 120 %
FEF2575-%Pred-Post: 133 %
FEV1-%Change-Post: 0 %
FEV1-%PRED-POST: 99 %
FEV1-%Pred-Pre: 98 %
FEV1-POST: 2.23 L
FEV1-Pre: 2.21 L
FEV1FVC-%Change-Post: 0 %
FEV1FVC-%Pred-Pre: 111 %
FEV6-%CHANGE-POST: 1 %
FEV6-%PRED-POST: 92 %
FEV6-%Pred-Pre: 91 %
FEV6-POST: 2.58 L
FEV6-PRE: 2.55 L
FEV6FVC-%PRED-POST: 103 %
FEV6FVC-%PRED-PRE: 103 %
FVC-%Change-Post: 1 %
FVC-%Pred-Post: 89 %
FVC-%Pred-Pre: 88 %
FVC-Post: 2.59 L
FVC-Pre: 2.55 L
POST FEV1/FVC RATIO: 86 %
POST FEV6/FVC RATIO: 100 %
Pre FEV1/FVC ratio: 87 %
Pre FEV6/FVC Ratio: 100 %
RV % pred: 82 %
RV: 1.44 L
TLC % PRED: 92 %
TLC: 4.11 L

## 2015-11-16 MED ORDER — IOHEXOL 350 MG/ML SOLN
100.0000 mL | Freq: Once | INTRAVENOUS | Status: AC | PRN
  Administered 2015-11-16: 100 mL via INTRAVENOUS

## 2015-11-16 MED ORDER — ALBUTEROL SULFATE (2.5 MG/3ML) 0.083% IN NEBU
2.5000 mg | INHALATION_SOLUTION | Freq: Once | RESPIRATORY_TRACT | Status: AC
  Administered 2015-11-16: 2.5 mg via RESPIRATORY_TRACT

## 2015-11-16 NOTE — Telephone Encounter (Signed)
Let her know no blood clot in lung. WE are still waiting for HRCT part of the results. Send message back to me s we can call her when those are back

## 2015-11-16 NOTE — Addendum Note (Signed)
Addended by: Sheran Luz on: 11/16/2015 09:29 AM   Modules accepted: Orders

## 2015-11-16 NOTE — Telephone Encounter (Signed)
LVM for pt to return call

## 2015-11-18 NOTE — Telephone Encounter (Addendum)
  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

## 2015-11-20 NOTE — Telephone Encounter (Signed)
Patient Returned call  [phone]

## 2015-11-20 NOTE — Telephone Encounter (Signed)
Patient notified of Dr. Jane Canary recommendations. Orders entered for Labs and ONO. Patient aware and says she will come by to have labs drawn on Thursday 11/22/2015 Nothing further needed.

## 2015-11-20 NOTE — Telephone Encounter (Signed)
Attempted to contact pt. No answer, no option to leave a message. Will try back.  

## 2015-11-22 ENCOUNTER — Other Ambulatory Visit (INDEPENDENT_AMBULATORY_CARE_PROVIDER_SITE_OTHER): Payer: BLUE CROSS/BLUE SHIELD

## 2015-11-22 DIAGNOSIS — J679 Hypersensitivity pneumonitis due to unspecified organic dust: Secondary | ICD-10-CM | POA: Diagnosis not present

## 2015-11-22 DIAGNOSIS — R938 Abnormal findings on diagnostic imaging of other specified body structures: Secondary | ICD-10-CM | POA: Diagnosis not present

## 2015-11-22 DIAGNOSIS — R9389 Abnormal findings on diagnostic imaging of other specified body structures: Secondary | ICD-10-CM

## 2015-11-22 LAB — SEDIMENTATION RATE: Sed Rate: 22 mm/hr (ref 0–22)

## 2015-11-22 LAB — RHEUMATOID FACTOR

## 2015-11-23 LAB — CK TOTAL AND CKMB (NOT AT ARMC)
CK TOTAL: 99 U/L (ref 7–177)
CK, MB: 2.2 ng/mL (ref 0.0–5.0)
RELATIVE INDEX: 2.2 (ref 0.0–4.0)

## 2015-11-23 LAB — ANTI-DNA ANTIBODY, DOUBLE-STRANDED: ds DNA Ab: 1 IU/mL

## 2015-11-23 LAB — RNP ANTIBODY: RIBONUCLEIC PROTEIN(ENA) ANTIBODY, IGG: NEGATIVE

## 2015-11-23 LAB — CYCLIC CITRUL PEPTIDE ANTIBODY, IGG

## 2015-11-23 LAB — SJOGRENS SYNDROME-B EXTRACTABLE NUCLEAR ANTIBODY: SSB (La) (ENA) Antibody, IgG: <1

## 2015-11-23 LAB — ANTI-SCLERODERMA ANTIBODY: SCLERODERMA (SCL-70) (ENA) ANTIBODY, IGG: NEGATIVE

## 2015-11-23 LAB — ANA: ANA: NEGATIVE

## 2015-11-23 LAB — ANCA SCREEN W REFLEX TITER: ANCA SCREEN: NEGATIVE

## 2015-11-23 LAB — SJOGRENS SYNDROME-A EXTRACTABLE NUCLEAR ANTIBODY: SSA (Ro) (ENA) Antibody, IgG: <1

## 2015-11-24 LAB — ANGIOTENSIN CONVERTING ENZYME: Angiotensin-Converting Enzyme: 59 U/L — ABNORMAL HIGH (ref 8–52)

## 2015-11-26 LAB — HYPERSENSITIVITY PNEUMONITIS
A. FUMIGATUS #1 ABS: NEGATIVE
A. Pullulans Abs: NEGATIVE
MICROPOLYSPORA FAENI IGG: NEGATIVE
PIGEON SERUM ABS: NEGATIVE
Thermoact. Saccharii: NEGATIVE
Thermoactinomyces vulgaris, IgG: NEGATIVE

## 2015-11-26 LAB — ANTIMYELOPEROXIDASE (MPO) ABS

## 2015-11-26 LAB — ALDOLASE: Aldolase: 7 U/L (ref <=8.1)

## 2015-11-26 LAB — ANTIPROTEINASE 3 (PR-3) ABS

## 2015-11-27 ENCOUNTER — Telehealth: Payer: Self-pay | Admitting: Internal Medicine

## 2015-11-27 NOTE — Telephone Encounter (Signed)
Called and spoke with Robin at [facility]. Zella Ball states that the issue with the order has been fixed and the order is being sent to [facility]. She states she needs nothing further from our office at this time. Nothing further needed. Will sign off on message.

## 2015-11-27 NOTE — Telephone Encounter (Signed)
Guys can you get her Robin Bonilla in next week to discuss results. I opened some office slots

## 2015-11-27 NOTE — Telephone Encounter (Signed)
ATC - unable to leave message at home number [facility] on mobile # Per Maylon Peppers to use held spots on 12/03/15 or 12/04/15

## 2015-11-28 NOTE — Telephone Encounter (Signed)
Made the patient appt for 3/6 at 4:30 pt aware

## 2015-12-03 ENCOUNTER — Ambulatory Visit (INDEPENDENT_AMBULATORY_CARE_PROVIDER_SITE_OTHER): Payer: BLUE CROSS/BLUE SHIELD | Admitting: Internal Medicine

## 2015-12-03 ENCOUNTER — Encounter: Payer: Self-pay | Admitting: Internal Medicine

## 2015-12-03 VITALS — BP 124/90 | HR 97 | Ht 60.0 in | Wt 199.0 lb

## 2015-12-03 DIAGNOSIS — R0600 Dyspnea, unspecified: Secondary | ICD-10-CM | POA: Insufficient documentation

## 2015-12-03 DIAGNOSIS — J849 Interstitial pulmonary disease, unspecified: Secondary | ICD-10-CM | POA: Insufficient documentation

## 2015-12-03 DIAGNOSIS — R0689 Other abnormalities of breathing: Secondary | ICD-10-CM | POA: Diagnosis not present

## 2015-12-03 NOTE — Progress Notes (Signed)
Subjective:     Patient ID: Robin Bonilla, female   DOB: [birth year], 60 y.o.   MRN: [id number]  HPI  IOV 11/13/2015  Chief Complaint  Patient presents with  . Pulmonary Consult    Pt referred by Dr. Delton SeeNelson for SOB with activty x 3 months and chest tightness when SOB - resolves with rest.  Pt denies cough. Pt had a stress test in 07/2015.     60 year old lady with a body mass index of 38. She does not have any weight change at baseline. 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 She's never had any problems with dyspnea up until October 2016. Since then she's had insidious onset of dyspnea not associated with weight change. Dyspnea happens at rest and with exertion but always consistently with exertion. Rest makes it better. One time when she went to the emergency room oxygen and nebulizers helped her. Occasionally dyspnea is associated with a mild dry cough. Severe to symptoms as moderate and it is stable since onset. Review of cardiology notes indicate she had a clear chest x-ray that I personally visualized. In 2014 she had a CT abdomen lung cut that I personally visualized and does not show interstitial lung disease. She had a cardiac stress test by Dr. Delton SeeNelson myocardial perfusion 08/16/2015 that was normal. Hemoglobin 13 g percent in October 2016. There is no associated wheezing. No orthopnea or paroxysmal nocturnal dyspnea. No travel no leg edema. There is some associated chest tightness that is mild.  Walking desaturation test 185 feet 3 laps on room air:  NO DESATS  Exhaled NO is 11 and not c/w eos asthma   OV 12/03/2015  Chief Complaint  Patient presents with  . Follow-up    Pt here after PFT, CT chest, HRCT. Pt states her breathing is unchanged since last OV.      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   She is here to review the above results. She now feels that her dyspnea might be getting better. We went over her exposure history. At work she is exposed to the standard dust particles because of [place] of hose 3 [place] The environment is dusty but is no mold or mildew. At no time she exposed to mold, mildew, hot tub, Jacuzzi, birds. She does not use feathered pillows. She denies any paint exposure.  Review of Systems     Objective:   Physical Exam Filed Vitals:   12/03/15 1637  BP: 124/90  Pulse: 97  Height: 5' (1.524 m)  Weight: 199 lb (90.266 kg)  SpO2: 98%  discussion only visit     Assessment:       ICD-9-CM ICD-10-CM   1. Dyspnea and respiratory abnormality 786.09 R06.00     R06.89   2. ILD (interstitial lung disease) (HCC) 515 J84.9         Plan:      At this point her interstitial lung disease findings are so mild that she does not even have an abnormality on her PFTs. Therefore I'm not so sure that her dyspnea is because of that although it could be because it is new. Physical deconditioning, obesity and diastolic dysfunction could be playing a role as well. We had  an extensive discussion about the role of surgical lung biopsy which I think might be too aggressive for her right now given her negative occupational history. An negative exposure history. It is probably best that we follow this along with a CT scan of the chest in 9 months. Meanwhile in order to make her feel better we'll get her to pulmonary rehabilitation. Closest hospital to [facility] and she will do it there. We will see her back in 3 months     (> 50% of this 15 min visit spent in face to face counseling or/and coordination of care)   Dr. Kalman Shan, M.D., [facility].C.P Pulmonary and Critical Care Medicine Staff Physician [facility] [place]  Pulmonary and Critical Care Pager: [phone], If no answer or between  15:00h - 7:00h: call [phone]  12/03/2015 5:30 PM

## 2015-12-03 NOTE — Patient Instructions (Addendum)
ICD-9-CM ICD-10-CM   1. Dyspnea and respiratory abnormality 786.09 R06.00     R06.89   2. ILD (interstitial lung disease) (HCC) 515 J84.9    We will keep an eye on your issue  Plan  - [facility]  = do repeat HRCT in 9 months   Follow-up  - 3 months to report progress with dyspnea and walk test at follow-up

## 2015-12-11 ENCOUNTER — Telehealth: Payer: Self-pay | Admitting: Internal Medicine

## 2015-12-11 NOTE — Telephone Encounter (Signed)
Pulm rehab now closed. WCB in AM 

## 2015-12-12 NOTE — Telephone Encounter (Signed)
Pt husband calling back about pulm rehab said to call them after 4.Robin Bonilla

## 2015-12-12 NOTE — Telephone Encounter (Signed)
Attempted to call [facility]. Line was busy. Per Robynn PaneElise, this form has been received and is awaiting MR to sign it.

## 2015-12-14 ENCOUNTER — Telehealth: Payer: Self-pay | Admitting: Internal Medicine

## 2015-12-14 NOTE — Telephone Encounter (Signed)
Called and spoke with pt. Pt wanted to call and discuss Pulmonary rehab. She states that she was told she would need to go to the class three times a week. She was also told it was cardiac rehab as well. I explained to her that it is pulmonary rehab and that there are a requirement of days that she must attend in order to stay enrolled in the program. She also wanted to see if her results from her sleep test have come back. I explained to her that we have not received the results and once we do MR will have his nurse call her with his recs.  She voiced understanding and had no further questions.

## 2015-12-14 NOTE — Telephone Encounter (Signed)
Forms will be address at MR's return to office

## 2015-12-19 NOTE — Telephone Encounter (Signed)
Routing to Elise for follow-up. 

## 2015-12-20 ENCOUNTER — Telehealth: Payer: Self-pay | Admitting: Internal Medicine

## 2015-12-20 NOTE — Telephone Encounter (Signed)
Per MR:  Pt's ONO results from 3.13.17 - Pt does not need nocturnal O2.   LMTCB for pt.

## 2015-12-20 NOTE — Telephone Encounter (Signed)
This has been signed and faxed back to Pulmonary Rehab at [facility]. Nothing further needed at this time. Will sign off.

## 2015-12-24 NOTE — Telephone Encounter (Signed)
Called and spoke to pt. Informed her of the results and recs per MR. Pt verbalized understanding and also states she does not want to proceed with pulmonary rehab d/t insurance issues. Nothing further needed at this time.   Will forward to MR as FYI.

## 2016-01-26 ENCOUNTER — Ambulatory Visit (HOSPITAL_COMMUNITY)
Admission: EM | Admit: 2016-01-26 | Discharge: 2016-01-26 | Disposition: A | Discharge status: Home / Self Care | Payer: BLUE CROSS/BLUE SHIELD | Attending: Emergency Medicine | Admitting: Emergency Medicine

## 2016-01-26 ENCOUNTER — Encounter (HOSPITAL_COMMUNITY): Payer: Self-pay | Admitting: Emergency Medicine

## 2016-01-26 DIAGNOSIS — M778 Other enthesopathies, not elsewhere classified: Secondary | ICD-10-CM

## 2016-01-26 MED ORDER — NAPROXEN SODIUM 550 MG PO TABS: 550.0000 mg | ORAL_TABLET | Freq: Two times a day (BID) | ORAL | Status: DC

## 2016-01-26 NOTE — ED Provider Notes (Signed)
CSN: [id number]     Arrival date & time 01/26/16  1320 History   First MD Initiated Contact with Patient 01/26/16 1359     Chief Complaint  Patient presents with  . Hand Pain   (Consider location/radiation/quality/duration/timing/severity/associated sxs/prior Treatment) HPI History obtained from patient:  Pt presents with the cc of: left hand and wrist pain Duration of symptoms: a couple of days Treatment prior to arrival: cold packs tylenol Context:no known injury or insult Other symptoms include:uanble to lift.  Pain score:4 FAMILY HISTORY: no cancer history SOCIAL HISTORY: non smoker  Past Medical History  Diagnosis Date  . Diabetes mellitus   . Thyroid disease   . Depression   . Rash, skin   . Hirsutism   . Hyperlipidemia   . Dysthymic disorder   . Acute sinusitis    Past Surgical History  Procedure Laterality Date  . Back surgery    . Partial hysterectomy    . Cholecystectomy  05/30/11  . Tubal ligation    . Tonsillectomy     Family History  Problem Relation Age of Onset  . Breast cancer Mother   . CAD Father   . CAD Maternal Grandmother   . Diabetes Paternal Grandfather   . Diabetes Paternal Aunt   . Hypertension Maternal Grandmother    Social History  Substance Use Topics  . Smoking status: Never Smoker   . Smokeless tobacco: Never Used  . Alcohol Use: No   OB History    No data available     Review of Systems ROS +'ve left wrist pain  Denies: HEADACHE, NAUSEA, ABDOMINAL PAIN, CHEST PAIN, CONGESTION, DYSURIA, SHORTNESS OF BREATH  Allergies  Penicillins and Simvastatin  Home Medications   Prior to Admission medications   Medication Sig Start Date End Date Taking? Authorizing Provider  glimepiride (AMARYL) 4 MG tablet Take 1 tablet by mouth daily. 07/20/15  Yes Historical Provider, MD  levothyroxine (SYNTHROID, LEVOTHROID) 137 MCG tablet Take 137 mcg by mouth daily before breakfast.   Yes Historical Provider, MD  metFORMIN (GLUMETZA) 500 MG  (MOD) 24 hr tablet Take 1,000 mg by mouth 2 (two) times daily.    Yes Historical Provider, MD  citalopram (CELEXA) 10 MG tablet Take 10 mg by mouth daily.    Historical Provider, MD   Meds Ordered and Administered this Visit  Medications - No data to display  BP 131/101 mmHg  Pulse 92  Temp(Src) 97.9 F (36.6 C) (Oral)  Resp 18  SpO2 96% No data found.   Physical Exam NURSES NOTES AND VITAL SIGNS REVIEWED. CONSTITUTIONAL: Well developed, well nourished, no acute distress HEENT: normocephalic, atraumatic EYES: Conjunctiva normal NECK:normal ROM, supple, no adenopathy PULMONARY:No respiratory distress, normal effort ABDOMINAL: Soft, ND, NT BS+, No CVAT MUSCULOSKELETAL: Normal ROM of all extremities, left wrist tender over the base of the left thumb. Lourena Simmonds test is positive.  SKIN: warm and dry without rash PSYCHIATRIC: Mood and affect, behavior are normal  ED Course  Procedures (including critical care time)  Labs Review Labs Reviewed - No data to display  Imaging Review No results found.   Visual Acuity Review  Right Eye Distance:   Left Eye Distance:   Bilateral Distance:    Right Eye Near:   Left Eye Near:    Bilateral Near:      RX anaprox, splint   MDM  No diagnosis found.  Patient is reassured that there are no issues that require transfer to higher level of care at this time  or additional tests. Patient is advised to continue home symptomatic treatment. Patient is advised that if there are new or worsening symptoms to attend the emergency department, contact primary care provider, or return to UC. Instructions of care provided discharged home in stable condition.    THIS NOTE WAS GENERATED USING A VOICE RECOGNITION SOFTWARE PROGRAM. ALL REASONABLE EFFORTS  WERE MADE TO PROOFREAD THIS DOCUMENT FOR ACCURACY.  I have verbally reviewed the discharge instructions with the patient. A printed AVS was given to the patient.  All questions were answered  prior to discharge.      Tharon AquasFrank C Patrick, PA 01/26/16 1506

## 2016-01-26 NOTE — ED Notes (Signed)
C/o left hand/arm pain onset yest... Denies inj/trauma Sx today include pain (4/10) and swelling A&O x4... No acute distress.

## 2016-01-26 NOTE — Discharge Instructions (Signed)
Heat Therapy Heat therapy can help ease sore, stiff, injured, and tight muscles and joints. Heat relaxes your muscles, which may help ease your pain. Heat therapy should only be used on old, pre-existing, or long-lasting (chronic) injuries. Do not use heat therapy unless told by your doctor. HOW TO USE HEAT THERAPY There are several different kinds of heat therapy, including:  Moist heat pack.  Warm water bath.  Hot water bottle.  Electric heating pad.  Heated gel pack.  Heated wrap.  Electric heating pad. GENERAL HEAT THERAPY RECOMMENDATIONS   Do not sleep while using heat therapy. Only use heat therapy while you are awake.  Your skin may turn pink while using heat therapy. Do not use heat therapy if your skin turns red.  Do not use heat therapy if you have new pain.  High heat or long exposure to heat can cause burns. Be careful when using heat therapy to avoid burning your skin.  Do not use heat therapy on areas of your skin that are already irritated, such as with a rash or sunburn. GET HELP IF:   You have blisters, redness, swelling (puffiness), or numbness.  You have new pain.  Your pain is worse. MAKE SURE YOU:  Understand these instructions.  Will watch your condition.  Will get help right away if you are not doing well or get worse.   This information is not intended to replace advice given to you by your health care provider. Make sure you discuss any questions you have with your health care provider.   Document Released: 12/08/2011 Document Revised: 10/06/2014 Document Reviewed: 11/08/2013 Elsevier Interactive Patient Education 2016 Elsevier Inc. Lollie Sailse Quervain Disease Lollie Sailse Quervain disease is inflammation of the tendon on the thumb side of the wrist. Tendons are cords of tissue that connect bones to muscles. The tendons in your hand pass through a tunnel, or sheath. A slippery layer of tissue (synovium) lets the tendons move smoothly in the sheath. With de  Quervain disease, the sheath swells or thickens, causing friction and pain. The condition is also called de Quervain tendinosis and de Quervain syndrome. It occurs most often in women who are 5730-517 years old. CAUSES  The exact cause of de Quervain disease is not known. It may result from:   Overusing your hands, especially with repetitive motions that involve twisting your hand or using a forceful grip.  Pregnancy.  Rheumatoid disease. RISK FACTORS You may have a greater risk for de Quervain disease if you:  Are a middle-aged woman.  Are pregnant.  Have rheumatoid arthritis.  Have diabetes.  Use your hands far more than normal, especially with a tight grip or excessive twisting. SIGNS AND SYMPTOMS Pain on the thumb side of your wrist is the main symptom of de Quervain disease. Other signs and symptoms include:  Pain that gets worse when you grasp something or turn your wrist.  Pain that extends up the forearm.  Cysts in the area of the pain.  Swelling of your wrist and hand.  A sensation of snapping in the wrist.  Trouble moving the thumb and wrist. DIAGNOSIS  Your health care provider may diagnose de Quervain disease based on your signs and symptoms. A physical exam will also be done. A simple test Lourena Simmonds(Finkelstein test) that involves pulling your thumb and wrist to see if this causes pain can help determine whether you have the condition. Sometimes you may need to have an X-ray.  TREATMENT  Avoiding any activity that causes pain  and swelling is the best treatment. Other options include:  Wearing a splint.  Taking medicine. Anti-inflammatory medicines and corticosteroid injections may reduce inflammation and relieve pain.  Having surgery if other treatments do not work. HOME CARE INSTRUCTIONS   Using ice can be helpful after doing activities that involve the sore wrist. To apply ice to the injured area:  Put ice in a plastic bag.  Place a towel between your skin and  the bag.  Leave the ice on for 20 minutes, 2-3 times a day.  Take medicines only as directed by your health care provider.  Wear your splint as directed. This will allow your hand to rest and heal. SEEK MEDICAL CARE IF:   Your pain medicine does not help.   Your pain gets worse.  You develop new symptoms. MAKE SURE YOU:   Understand these instructions.  Will watch your condition.  Will get help right away if you are not doing well or get worse.   This information is not intended to replace advice given to you by your health care provider. Make sure you discuss any questions you have with your health care provider.   Document Released: 06/10/2001 Document Revised: 10/06/2014 Document Reviewed: 01/18/2014 Elsevier Interactive Patient Education [employer]

## 2016-01-30 ENCOUNTER — Encounter: Payer: Self-pay | Admitting: Internal Medicine

## 2016-03-04 ENCOUNTER — Ambulatory Visit: Payer: BLUE CROSS/BLUE SHIELD | Admitting: Internal Medicine

## 2016-05-26 DIAGNOSIS — E039 Hypothyroidism, unspecified: Secondary | ICD-10-CM | POA: Diagnosis not present

## 2016-05-26 DIAGNOSIS — F329 Major depressive disorder, single episode, unspecified: Secondary | ICD-10-CM | POA: Diagnosis not present

## 2016-05-26 DIAGNOSIS — E1165 Type 2 diabetes mellitus with hyperglycemia: Secondary | ICD-10-CM | POA: Diagnosis not present

## 2016-05-26 DIAGNOSIS — E7800 Pure hypercholesterolemia, unspecified: Secondary | ICD-10-CM | POA: Diagnosis not present

## 2016-06-24 DIAGNOSIS — Z23 Encounter for immunization: Secondary | ICD-10-CM | POA: Diagnosis not present

## 2016-07-10 DIAGNOSIS — Z01419 Encounter for gynecological examination (general) (routine) without abnormal findings: Secondary | ICD-10-CM | POA: Diagnosis not present

## 2016-07-10 DIAGNOSIS — Z6838 Body mass index (BMI) 38.0-38.9, adult: Secondary | ICD-10-CM | POA: Diagnosis not present

## 2016-08-18 ENCOUNTER — Inpatient Hospital Stay: Admission: RE | Admit: 2016-08-18 | Payer: BLUE CROSS/BLUE SHIELD | Source: Ambulatory Visit

## 2016-08-20 ENCOUNTER — Other Ambulatory Visit: Payer: Self-pay | Admitting: Obstetrics & Gynecology

## 2016-08-20 DIAGNOSIS — Z1231 Encounter for screening mammogram for malignant neoplasm of breast: Secondary | ICD-10-CM

## 2016-09-11 DIAGNOSIS — J018 Other acute sinusitis: Secondary | ICD-10-CM | POA: Diagnosis not present

## 2016-09-19 DIAGNOSIS — M79672 Pain in left foot: Secondary | ICD-10-CM | POA: Diagnosis not present

## 2016-09-19 DIAGNOSIS — M25561 Pain in right knee: Secondary | ICD-10-CM | POA: Diagnosis not present

## 2016-09-19 DIAGNOSIS — M25572 Pain in left ankle and joints of left foot: Secondary | ICD-10-CM | POA: Diagnosis not present

## 2016-09-30 ENCOUNTER — Ambulatory Visit
Admission: RE | Admit: 2016-09-30 | Discharge: 2016-09-30 | Disposition: A | Discharge status: Home / Self Care | Payer: BLUE CROSS/BLUE SHIELD | Source: Ambulatory Visit | Attending: Obstetrics & Gynecology | Admitting: Obstetrics & Gynecology

## 2016-09-30 DIAGNOSIS — Z1231 Encounter for screening mammogram for malignant neoplasm of breast: Secondary | ICD-10-CM | POA: Diagnosis not present

## 2016-10-26 DIAGNOSIS — J209 Acute bronchitis, unspecified: Secondary | ICD-10-CM | POA: Diagnosis not present

## 2016-10-26 DIAGNOSIS — J040 Acute laryngitis: Secondary | ICD-10-CM | POA: Diagnosis not present

## 2016-11-19 DIAGNOSIS — E7800 Pure hypercholesterolemia, unspecified: Secondary | ICD-10-CM | POA: Diagnosis not present

## 2016-11-19 DIAGNOSIS — F329 Major depressive disorder, single episode, unspecified: Secondary | ICD-10-CM | POA: Diagnosis not present

## 2016-11-19 DIAGNOSIS — E039 Hypothyroidism, unspecified: Secondary | ICD-10-CM | POA: Diagnosis not present

## 2016-11-19 DIAGNOSIS — E1165 Type 2 diabetes mellitus with hyperglycemia: Secondary | ICD-10-CM | POA: Diagnosis not present

## 2016-12-31 DIAGNOSIS — M25562 Pain in left knee: Secondary | ICD-10-CM | POA: Diagnosis not present

## 2017-05-01 DIAGNOSIS — D120 Benign neoplasm of cecum: Secondary | ICD-10-CM | POA: Diagnosis not present

## 2017-05-01 DIAGNOSIS — D126 Benign neoplasm of colon, unspecified: Secondary | ICD-10-CM | POA: Diagnosis not present

## 2017-05-01 DIAGNOSIS — Z1211 Encounter for screening for malignant neoplasm of colon: Secondary | ICD-10-CM | POA: Diagnosis not present

## 2017-05-01 LAB — HM COLONOSCOPY

## 2017-07-23 DIAGNOSIS — Z6838 Body mass index (BMI) 38.0-38.9, adult: Secondary | ICD-10-CM | POA: Diagnosis not present

## 2017-07-23 DIAGNOSIS — Z01419 Encounter for gynecological examination (general) (routine) without abnormal findings: Secondary | ICD-10-CM | POA: Diagnosis not present

## 2017-07-23 DIAGNOSIS — R1904 Left lower quadrant abdominal swelling, mass and lump: Secondary | ICD-10-CM | POA: Diagnosis not present

## 2017-07-23 DIAGNOSIS — E1165 Type 2 diabetes mellitus with hyperglycemia: Secondary | ICD-10-CM | POA: Diagnosis not present

## 2017-07-23 DIAGNOSIS — F329 Major depressive disorder, single episode, unspecified: Secondary | ICD-10-CM | POA: Diagnosis not present

## 2017-07-23 DIAGNOSIS — E039 Hypothyroidism, unspecified: Secondary | ICD-10-CM | POA: Diagnosis not present

## 2017-07-23 DIAGNOSIS — E7800 Pure hypercholesterolemia, unspecified: Secondary | ICD-10-CM | POA: Diagnosis not present

## 2017-07-31 ENCOUNTER — Other Ambulatory Visit: Payer: Self-pay | Admitting: Obstetrics & Gynecology

## 2017-07-31 DIAGNOSIS — N839 Noninflammatory disorder of ovary, fallopian tube and broad ligament, unspecified: Secondary | ICD-10-CM | POA: Diagnosis not present

## 2017-07-31 DIAGNOSIS — N83202 Unspecified ovarian cyst, left side: Secondary | ICD-10-CM | POA: Diagnosis not present

## 2017-07-31 DIAGNOSIS — D271 Benign neoplasm of left ovary: Secondary | ICD-10-CM | POA: Diagnosis not present

## 2017-08-11 DIAGNOSIS — L299 Pruritus, unspecified: Secondary | ICD-10-CM | POA: Diagnosis not present

## 2017-08-11 DIAGNOSIS — L300 Nummular dermatitis: Secondary | ICD-10-CM | POA: Diagnosis not present

## 2017-08-25 ENCOUNTER — Other Ambulatory Visit: Payer: Self-pay | Admitting: Obstetrics & Gynecology

## 2017-08-25 DIAGNOSIS — Z1231 Encounter for screening mammogram for malignant neoplasm of breast: Secondary | ICD-10-CM

## 2017-08-25 DIAGNOSIS — L300 Nummular dermatitis: Secondary | ICD-10-CM | POA: Diagnosis not present

## 2017-08-28 DIAGNOSIS — E119 Type 2 diabetes mellitus without complications: Secondary | ICD-10-CM | POA: Diagnosis not present

## 2017-10-01 ENCOUNTER — Ambulatory Visit: Payer: BLUE CROSS/BLUE SHIELD

## 2017-10-05 ENCOUNTER — Ambulatory Visit
Admission: RE | Admit: 2017-10-05 | Discharge: 2017-10-05 | Disposition: A | Discharge status: Home / Self Care | Payer: BLUE CROSS/BLUE SHIELD | Source: Ambulatory Visit | Attending: Obstetrics & Gynecology | Admitting: Obstetrics & Gynecology

## 2017-10-05 DIAGNOSIS — Z1231 Encounter for screening mammogram for malignant neoplasm of breast: Secondary | ICD-10-CM

## 2017-11-16 DIAGNOSIS — D485 Neoplasm of uncertain behavior of skin: Secondary | ICD-10-CM | POA: Diagnosis not present

## 2017-12-07 DIAGNOSIS — J09X2 Influenza due to identified novel influenza A virus with other respiratory manifestations: Secondary | ICD-10-CM | POA: Diagnosis not present

## 2017-12-14 DIAGNOSIS — J018 Other acute sinusitis: Secondary | ICD-10-CM | POA: Diagnosis not present

## 2017-12-14 DIAGNOSIS — R21 Rash and other nonspecific skin eruption: Secondary | ICD-10-CM | POA: Diagnosis not present

## 2018-01-10 ENCOUNTER — Other Ambulatory Visit: Payer: Self-pay

## 2018-01-10 ENCOUNTER — Ambulatory Visit (HOSPITAL_COMMUNITY)
Admission: EM | Admit: 2018-01-10 | Discharge: 2018-01-10 | Disposition: A | Discharge status: Home / Self Care | Payer: BLUE CROSS/BLUE SHIELD | Attending: Internal Medicine | Admitting: Internal Medicine

## 2018-01-10 ENCOUNTER — Encounter (HOSPITAL_COMMUNITY): Payer: Self-pay | Admitting: Emergency Medicine

## 2018-01-10 DIAGNOSIS — J22 Unspecified acute lower respiratory infection: Secondary | ICD-10-CM

## 2018-01-10 MED ORDER — BENZONATATE 100 MG PO CAPS: 100.0000 mg | ORAL_CAPSULE | Freq: Three times a day (TID) | ORAL | 0 refills | Status: DC

## 2018-01-10 MED ORDER — ACETAMINOPHEN 325 MG PO TABS
650.0000 mg | ORAL_TABLET | Freq: Once | ORAL | Status: AC
  Administered 2018-01-10: 650 mg via ORAL

## 2018-01-10 MED ORDER — AZITHROMYCIN 250 MG PO TABS: ORAL_TABLET | ORAL | 0 refills | Status: AC

## 2018-01-10 MED ORDER — ACETAMINOPHEN 325 MG PO TABS
ORAL_TABLET | ORAL | Status: AC
  Filled 2018-01-10: qty 2

## 2018-01-10 NOTE — ED Triage Notes (Signed)
On Thursday started coughing, then throat was sore.  Denies runny nose or sinus drainage.  Stomach feels queasy, but no nausea, vomiting or diarrhea

## 2018-01-10 NOTE — Discharge Instructions (Addendum)
If at any point you seem to be getting worse while taking antibiotics and please come back in or go to the emergency department.  Please take Tylenol 1000 mg every 8 hours as needed for pain and fever.

## 2018-01-10 NOTE — ED Provider Notes (Signed)
01/10/2018 5:17 PM   DOB: 10-18-1955 / MRN: [id number]  SUBJECTIVE:    Robin Bonilla is a 62 y.o. female presenting for cough, chest congestion, fever, fatigue.  Symptoms started Thursday.  She tells me that her husband has also been sick with similar symptoms and is improving.  He is with her today.  She denies lower leg pain, leg swelling, chest pain, shortness of breath.  [place] [facility] [place] [facility] [place].  She tells me that she had a sore throat near the onset of the illness and this resolved.  She is allergic to penicillins and simvastatin.   She  has a past medical history of Acute sinusitis, Depression, Diabetes mellitus, Dysthymic disorder, Hirsutism, Hyperlipidemia, Rash, skin, and Thyroid disease.    She  reports that she has never smoked. She has never used smokeless tobacco. She reports that she does not drink alcohol or use drugs. She  has no sexual activity history on file. The patient  has a past surgical history that includes Back surgery; Partial hysterectomy; Cholecystectomy (05/30/11); Tubal ligation; and Tonsillectomy.  Her family history includes Breast cancer (age of onset: 5370) in her mother; CAD in her father and maternal grandmother; Diabetes in her paternal aunt and paternal grandfather; Hypertension in her maternal grandmother.  Review of Systems  Constitutional: Negative for chills, diaphoresis and fever.  Respiratory: Positive for cough and sputum production. Negative for hemoptysis, shortness of breath and wheezing.   Cardiovascular: Negative for chest pain, orthopnea and leg swelling.  Gastrointestinal: Negative for nausea.  Musculoskeletal: Positive for myalgias.  Skin: Negative for rash.  Neurological: Negative for dizziness.    OBJECTIVE:  BP 119/63 (BP Location: Left Arm) Comment (BP Location): large cuff  Pulse (!) 120   Temp (!) 100.4 F (38 C) (Oral)   Resp (!) 24   SpO2 96%   Physical Exam  Constitutional:  Non-toxic  appearance. She does not have a sickly appearance. She appears ill. No distress.  Cardiovascular:  No extrasystoles are present. Tachycardia present. Exam reveals no gallop and no friction rub.  Pulmonary/Chest: Effort normal and breath sounds normal. No stridor. No respiratory distress. She has no wheezes. She has no rales. She exhibits no tenderness.  Musculoskeletal: Normal range of motion.  Skin: She is not diaphoretic.    No results found for this or any previous visit (from the past 72 hour(s)).  No results found.  ASSESSMENT AND PLAN:  No orders of the defined types were placed in this encounter.    LRTI (lower respiratory tract infection): Patient with a history of diabetes here today with 4 days of worsening cough and fever.  She tells me she had the flu about 1 month ago and this was found on flu swab.  Husband has also been ill and has been taking antibiotics.  Her lung exam is good today however I do worry that she may be developing pneumonia given her vital signs.  Starting her on azithromycin to cover for community-acquired pneumonia and prescribing a cough drop.  Of advised that she take Tylenol 1000 mg every 8 hours as needed.  RTC precautions discussed.      The patient is advised to call or return to clinic if she does not see an improvement in symptoms, or to seek the care of the closest emergency department if she worsens with the above plan.   Deliah BostonMichael Clark, MHS, PA-C 01/10/2018 5:17 PM    Ofilia Neaslark, Michael L, PA-C 01/10/18 1719

## 2018-01-11 DIAGNOSIS — J00 Acute nasopharyngitis [common cold]: Secondary | ICD-10-CM | POA: Diagnosis not present

## 2018-02-02 DIAGNOSIS — E039 Hypothyroidism, unspecified: Secondary | ICD-10-CM | POA: Diagnosis not present

## 2018-02-02 DIAGNOSIS — E1165 Type 2 diabetes mellitus with hyperglycemia: Secondary | ICD-10-CM | POA: Diagnosis not present

## 2018-02-02 DIAGNOSIS — E7800 Pure hypercholesterolemia, unspecified: Secondary | ICD-10-CM | POA: Diagnosis not present

## 2018-02-23 DIAGNOSIS — F329 Major depressive disorder, single episode, unspecified: Secondary | ICD-10-CM | POA: Diagnosis not present

## 2018-02-23 DIAGNOSIS — E7800 Pure hypercholesterolemia, unspecified: Secondary | ICD-10-CM | POA: Diagnosis not present

## 2018-02-23 DIAGNOSIS — E1165 Type 2 diabetes mellitus with hyperglycemia: Secondary | ICD-10-CM | POA: Diagnosis not present

## 2018-02-23 DIAGNOSIS — E039 Hypothyroidism, unspecified: Secondary | ICD-10-CM | POA: Diagnosis not present

## 2018-05-17 DIAGNOSIS — R0683 Snoring: Secondary | ICD-10-CM | POA: Diagnosis not present

## 2018-05-17 DIAGNOSIS — R5383 Other fatigue: Secondary | ICD-10-CM | POA: Diagnosis not present

## 2018-05-17 DIAGNOSIS — E034 Atrophy of thyroid (acquired): Secondary | ICD-10-CM | POA: Diagnosis not present

## 2018-06-17 DIAGNOSIS — E7800 Pure hypercholesterolemia, unspecified: Secondary | ICD-10-CM | POA: Diagnosis not present

## 2018-06-17 DIAGNOSIS — E1165 Type 2 diabetes mellitus with hyperglycemia: Secondary | ICD-10-CM | POA: Diagnosis not present

## 2018-06-17 DIAGNOSIS — L680 Hirsutism: Secondary | ICD-10-CM | POA: Diagnosis not present

## 2018-06-24 DIAGNOSIS — E1165 Type 2 diabetes mellitus with hyperglycemia: Secondary | ICD-10-CM | POA: Diagnosis not present

## 2018-06-24 DIAGNOSIS — Z23 Encounter for immunization: Secondary | ICD-10-CM | POA: Diagnosis not present

## 2018-06-24 DIAGNOSIS — E039 Hypothyroidism, unspecified: Secondary | ICD-10-CM | POA: Diagnosis not present

## 2018-06-24 DIAGNOSIS — F329 Major depressive disorder, single episode, unspecified: Secondary | ICD-10-CM | POA: Diagnosis not present

## 2018-06-24 DIAGNOSIS — E7800 Pure hypercholesterolemia, unspecified: Secondary | ICD-10-CM | POA: Diagnosis not present

## 2018-07-28 DIAGNOSIS — N390 Urinary tract infection, site not specified: Secondary | ICD-10-CM | POA: Diagnosis not present

## 2018-07-28 DIAGNOSIS — Z6837 Body mass index (BMI) 37.0-37.9, adult: Secondary | ICD-10-CM | POA: Diagnosis not present

## 2018-07-28 DIAGNOSIS — Z13820 Encounter for screening for osteoporosis: Secondary | ICD-10-CM | POA: Diagnosis not present

## 2018-07-28 DIAGNOSIS — Z01419 Encounter for gynecological examination (general) (routine) without abnormal findings: Secondary | ICD-10-CM | POA: Diagnosis not present

## 2018-08-02 ENCOUNTER — Emergency Department (HOSPITAL_BASED_OUTPATIENT_CLINIC_OR_DEPARTMENT_OTHER)
Admission: EM | Admit: 2018-08-02 | Discharge: 2018-08-02 | Disposition: A | Discharge status: Home / Self Care | Payer: BLUE CROSS/BLUE SHIELD | Attending: Emergency Medicine | Admitting: Emergency Medicine

## 2018-08-02 ENCOUNTER — Encounter (HOSPITAL_BASED_OUTPATIENT_CLINIC_OR_DEPARTMENT_OTHER): Payer: Self-pay | Admitting: Emergency Medicine

## 2018-08-02 ENCOUNTER — Other Ambulatory Visit: Payer: Self-pay

## 2018-08-02 DIAGNOSIS — E119 Type 2 diabetes mellitus without complications: Secondary | ICD-10-CM | POA: Insufficient documentation

## 2018-08-02 DIAGNOSIS — Z7984 Long term (current) use of oral hypoglycemic drugs: Secondary | ICD-10-CM | POA: Insufficient documentation

## 2018-08-02 DIAGNOSIS — F329 Major depressive disorder, single episode, unspecified: Secondary | ICD-10-CM | POA: Insufficient documentation

## 2018-08-02 DIAGNOSIS — Z9049 Acquired absence of other specified parts of digestive tract: Secondary | ICD-10-CM | POA: Diagnosis not present

## 2018-08-02 DIAGNOSIS — R1084 Generalized abdominal pain: Secondary | ICD-10-CM

## 2018-08-02 DIAGNOSIS — Z79899 Other long term (current) drug therapy: Secondary | ICD-10-CM | POA: Insufficient documentation

## 2018-08-02 LAB — CBC WITH DIFFERENTIAL/PLATELET
Abs Immature Granulocytes: 0.03 10*3/uL (ref 0.00–0.07)
Basophils Absolute: 0 10*3/uL (ref 0.0–0.1)
Basophils Relative: 1 %
Eosinophils Absolute: 0.3 10*3/uL (ref 0.0–0.5)
Eosinophils Relative: 4 %
HCT: 43.1 % (ref 36.0–46.0)
Hemoglobin: 13.4 g/dL (ref 12.0–15.0)
IMMATURE GRANULOCYTES: 0 %
LYMPHS ABS: 2.5 10*3/uL (ref 0.7–4.0)
LYMPHS PCT: 31 %
MCH: 25.8 pg — ABNORMAL LOW (ref 26.0–34.0)
MCHC: 31.1 g/dL (ref 30.0–36.0)
MCV: 82.9 fL (ref 80.0–100.0)
Monocytes Absolute: 0.5 10*3/uL (ref 0.1–1.0)
Monocytes Relative: 6 %
NEUTROS ABS: 4.6 10*3/uL (ref 1.7–7.7)
NEUTROS PCT: 58 %
PLATELETS: 229 10*3/uL (ref 150–400)
RBC: 5.2 MIL/uL — ABNORMAL HIGH (ref 3.87–5.11)
RDW: 14.4 % (ref 11.5–15.5)
WBC: 8.1 10*3/uL (ref 4.0–10.5)
nRBC: 0 % (ref 0.0–0.2)

## 2018-08-02 LAB — COMPREHENSIVE METABOLIC PANEL
ALT: 27 U/L (ref 0–44)
ANION GAP: 13 (ref 5–15)
AST: 23 U/L (ref 15–41)
Albumin: 3.8 g/dL (ref 3.5–5.0)
Alkaline Phosphatase: 69 U/L (ref 38–126)
BILIRUBIN TOTAL: 1.2 mg/dL (ref 0.3–1.2)
BUN: 12 mg/dL (ref 6–20)
CHLORIDE: 100 mmol/L (ref 98–111)
CO2: 26 mmol/L (ref 22–32)
Calcium: 9.5 mg/dL (ref 8.9–10.3)
Creatinine, Ser: 0.81 mg/dL (ref 0.44–1.00)
GFR calc Af Amer: >60 mL/min (ref >60)
GFR calc non Af Amer: >60 mL/min (ref >60)
GLUCOSE: 166 mg/dL — AB (ref 70–99)
POTASSIUM: 4.1 mmol/L (ref 3.5–5.1)
SODIUM: 139 mmol/L (ref 135–145)
TOTAL PROTEIN: 7.8 g/dL (ref 6.5–8.1)

## 2018-08-02 LAB — LIPASE, BLOOD: Lipase: 24 U/L (ref 11–51)

## 2018-08-02 MED ORDER — ALUM & MAG HYDROXIDE-SIMETH 200-200-20 MG/5ML PO SUSP
30.0000 mL | Freq: Once | ORAL | Status: AC
  Administered 2018-08-02: 30 mL via ORAL
  Filled 2018-08-02: qty 30

## 2018-08-02 MED ORDER — SODIUM CHLORIDE 0.9 % IV BOLUS
1000.0000 mL | Freq: Once | INTRAVENOUS | Status: AC
  Administered 2018-08-02: 1000 mL via INTRAVENOUS

## 2018-08-02 MED ORDER — ONDANSETRON HCL 4 MG/2ML IJ SOLN
4.0000 mg | Freq: Once | INTRAMUSCULAR | Status: AC
  Administered 2018-08-02: 4 mg via INTRAVENOUS
  Filled 2018-08-02: qty 2

## 2018-08-02 NOTE — ED Provider Notes (Signed)
[facility] EMERGENCY DEPARTMENT Provider Note  CSN: [id number] Arrival date & time: 08/02/18 0416  Chief Complaint(s) Abdominal Pain  HPI Robin Bonilla is a 62 y.o. female   The history is provided by the patient.  Abdominal Pain   This is a new problem. Episode frequency: intermittent. Progression since onset: fluctuating. The pain is associated with an unknown factor. The pain is located in the generalized abdominal region. The quality of the pain is burning. The pain is moderate. Associated symptoms include nausea. Pertinent negatives include fever, diarrhea, hematochezia, melena, vomiting, constipation, dysuria and frequency. Nothing aggravates the symptoms. Nothing relieves the symptoms.    Past Medical History Past Medical History:  Diagnosis Date  . Acute sinusitis   . Depression   . Diabetes mellitus   . Dysthymic disorder   . Hirsutism   . Hyperlipidemia   . Rash, skin   . Thyroid disease    Patient Active Problem List   Diagnosis Date Noted  . Dyspnea and respiratory abnormality 12/03/2015  . ILD (interstitial lung disease) (HCC) 12/03/2015  . SOB (shortness of breath) 08/21/2015  . DOE (dyspnea on exertion) 08/08/2015  . Nausea 06/09/2011  . Dermatitis, contact 06/09/2011  . Cholelithiasis 05/08/2011  . 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 Authorizing Provider  benzonatate (TESSALON) 100 MG capsule Take 1 capsule (100 mg total) by mouth every 8 (eight) hours. 01/10/18   Ofilia Neas, PA-C  glimepiride (AMARYL) 4 MG tablet Take 1 tablet by mouth daily. 07/20/15   [provider]  levothyroxine (SYNTHROID, LEVOTHROID) 137 MCG tablet Take 137 mcg by mouth daily before breakfast.    [provider]  metFORMIN (GLUMETZA) 500 MG (MOD) 24 hr tablet Take 1,000 mg by mouth 2 (two) times daily.     [provider]  naproxen sodium (ANAPROX DS)  550 MG tablet Take 1 tablet (550 mg total) by mouth 2 (two) times daily with a meal. 01/26/16   Tharon Aquas, PA                                                                                                                                    Past Surgical History Past Surgical History:  Procedure Laterality Date  . BACK SURGERY    . CHOLECYSTECTOMY  05/30/11  . PARTIAL HYSTERECTOMY    . TONSILLECTOMY    . TUBAL LIGATION     Family History Family History  Problem Relation Age of Onset  . Breast cancer Mother 50  . CAD Father   . CAD Maternal Grandmother   . Hypertension Maternal Grandmother   . Diabetes Paternal Grandfather   . Diabetes Paternal Aunt     Social History Social History   Tobacco Use  . Smoking status: Never Smoker  . Smokeless tobacco: Never Used  Substance Use Topics  . Alcohol  use: No    Alcohol/week: 0.0 standard drinks  . Drug use: No   Allergies Penicillins and Simvastatin  Review of Systems Review of Systems  Constitutional: Negative for fever.  Gastrointestinal: Positive for abdominal pain and nausea. Negative for constipation, diarrhea, hematochezia, melena and vomiting.  Genitourinary: Negative for dysuria and frequency.   All other systems are reviewed and are negative for acute change except as noted in the HPI  Physical Exam Vital Signs  I have reviewed the triage vital signs BP (!) 142/81   Pulse 78   Temp 98 F (36.7 C) (Oral)   Resp 20   Ht 5' (1.524 m)   Wt 81.6 kg   SpO2 100%   BMI 35.15 kg/m   Physical Exam  Constitutional: She is oriented to person, place, and time. She appears well-developed and well-nourished. No distress.  HENT:  Head: Normocephalic and atraumatic.  Right Ear: External ear normal.  Left Ear: External ear normal.  Nose: Nose normal.  Eyes: Conjunctivae and EOM are normal. No scleral icterus.  Neck: Normal range of motion and phonation normal.  Cardiovascular: Normal rate and regular rhythm.    Pulmonary/Chest: Effort normal. No stridor. No respiratory distress.  Abdominal: She exhibits no distension. There is no tenderness. There is no rigidity, no rebound and no guarding.  Musculoskeletal: Normal range of motion. She exhibits no edema.  Neurological: She is alert and oriented to person, place, and time.  Skin: She is not diaphoretic.  Psychiatric: She has a normal mood and affect. Her behavior is normal.  Vitals reviewed.   ED Results and Treatments Labs (all labs ordered are listed, but only abnormal results are displayed) Labs Reviewed  CBC WITH DIFFERENTIAL/PLATELET - Abnormal; Notable for the following components:      Result Value   RBC 5.20 (*)    MCH 25.8 (*)    All other components within normal limits  COMPREHENSIVE METABOLIC PANEL - Abnormal; Notable for the following components:   Glucose, Bld 166 (*)    All other components within normal limits  LIPASE, BLOOD                                                                                                                         EKG  EKG Interpretation  Date/Time:    Ventricular Rate:    PR Interval:    QRS Duration:   QT Interval:    QTC Calculation:   R Axis:     Text Interpretation:        Radiology No results found. Pertinent labs & imaging results that were available during my care of the patient were reviewed by me and considered in my medical decision making (see chart for details).  Medications Ordered in ED Medications  sodium chloride 0.9 % bolus 1,000 mL (1,000 mLs Intravenous New Bag/Given 08/02/18 0506)  ondansetron (ZOFRAN) injection 4 mg (4 mg Intravenous Given 08/02/18 0509)  alum & mag hydroxide-simeth (MAALOX/MYLANTA) 200-200-20 MG/5ML suspension 30  mL (30 mLs Oral Given 08/02/18 0510)                                                                                                                                    Procedures Procedures  (including critical care time)  Medical  Decision Making / ED Course I have reviewed the nursing notes for this encounter and the patient's prior records (if available in EHR or on provided paperwork).    Diffuse abdominal burning sensation.  Abdomen benign on exam.  Labs grossly reassuring without leukocytosis.  No significant electrolyte derangements or renal insufficiency.  No evidence of biliary obstruction or pancreatitis.  Patient denied any urinary symptoms requiring UA.  She reported some improvement with GI cocktail, which may likely be the cause of her symptoms.  Doubt serious intra-abdominal inflammatory/infectious process requiring imaging at this time.  The patient appears reasonably screened and/or stabilized for discharge and I doubt any other medical condition or other [facility] requiring further screening, evaluation, or treatment in the ED at this time prior to discharge.  The patient is safe for discharge with strict return precautions.   Final Clinical Impression(s) / ED Diagnoses Final diagnoses:  Abdominal discomfort, generalized   Disposition: Discharge  Condition: Good  I have discussed the results, Dx and Tx plan with the patient and husband who expressed understanding and agree(s) with the plan. Discharge instructions discussed at great length. The patient and husband were given strict return precautions who verbalized understanding of the instructions. No further questions at time of discharge.    ED Discharge Orders    None       Follow Up: Blane Ohara, MD [address] [place] [address] [phone]  Schedule an appointment as soon as possible for a visit  in 5-7 days, If symptoms do not improve or  worsen      This chart was dictated using voice recognition software.  Despite best efforts to proofread,  errors can occur which can change the documentation meaning.   Nira Conn, MD 08/02/18 [phone]

## 2018-08-02 NOTE — ED Notes (Signed)
ED Provider at bedside. 

## 2018-08-02 NOTE — ED Notes (Signed)
Pt and family member understood dc material. NAD noted 

## 2018-08-02 NOTE — Discharge Instructions (Addendum)

## 2018-08-02 NOTE — ED Triage Notes (Signed)
Pt states she feels like her stomach is burning. She states it is generalized. She also states she is having myalgias. CBG of 280 this am

## 2018-08-05 DIAGNOSIS — N390 Urinary tract infection, site not specified: Secondary | ICD-10-CM | POA: Diagnosis not present

## 2018-08-13 ENCOUNTER — Encounter (HOSPITAL_COMMUNITY): Payer: Self-pay

## 2018-08-13 ENCOUNTER — Ambulatory Visit (HOSPITAL_COMMUNITY)
Admission: EM | Admit: 2018-08-13 | Discharge: 2018-08-13 | Disposition: A | Discharge status: Home / Self Care | Payer: BLUE CROSS/BLUE SHIELD | Attending: Family Medicine | Admitting: Family Medicine

## 2018-08-13 DIAGNOSIS — N3090 Cystitis, unspecified without hematuria: Secondary | ICD-10-CM | POA: Diagnosis not present

## 2018-08-13 LAB — POCT URINALYSIS DIP (DEVICE)
BILIRUBIN URINE: NEGATIVE
Glucose, UA: NEGATIVE mg/dL
HGB URINE DIPSTICK: NEGATIVE
KETONES UR: NEGATIVE mg/dL
Leukocytes, UA: NEGATIVE
Nitrite: NEGATIVE
PH: 5 (ref 5.0–8.0)
Protein, ur: NEGATIVE mg/dL
Urobilinogen, UA: 0.2 mg/dL (ref 0.0–1.0)

## 2018-08-13 MED ORDER — SULFAMETHOXAZOLE-TRIMETHOPRIM 800-160 MG PO TABS: 1.0000 | ORAL_TABLET | Freq: Two times a day (BID) | ORAL | 0 refills | Status: AC

## 2018-08-13 NOTE — ED Triage Notes (Signed)
Pt presents with urinary tract symptoms; burning with urination and abdominal discomfort.

## 2018-08-13 NOTE — ED Provider Notes (Signed)
[facility]    ASSESSMENT & PLAN:  1. Cystitis   No signs of pyelonephritis. Reassured. Incomplete treatment. Will start: Meds ordered this encounter  Medications  . sulfamethoxazole-trimethoprim (BACTRIM DS,SEPTRA DS) 800-160 MG tablet    Sig: Take 1 tablet by mouth 2 (two) times daily for 5 days.    Dispense:  10 tablet    Refill:  0   No culture sent. U/A unremarkable here tonight. Will follow up with her PCP or here if not showing improvement over the next 48 hours, sooner if needed. Ensure adequate fluid intake.  Outlined signs and symptoms indicating need for more acute intervention. Patient verbalized understanding. After Visit Summary given.  SUBJECTIVE:  Robin Bonilla is a 62 y.o. female who complains of urinary frequency and dysuria for the past several days. Was started on Macrobid by her PCP last week. Took a few doses but unable to finish secondary to feeling to sleepy from medicine. This improved when she stopped taking Macrobid. No flank pain, fever, chills, abnormal vaginal discharge or bleeding. Hematuria: not present. Normal PO intake. No specific abdominal pain reported. No self treatment. Ambulatory without difficulty. Blood sugars running in the 160s; around her usual.  LMP: No LMP recorded. Patient has had a hysterectomy.  ROS: As in HPI. All other systems negative.   OBJECTIVE:  Vitals:   08/13/18 1643  BP: 125/68  Pulse: 82  Resp: 18  Temp: 98.2 F (36.8 C)  TempSrc: Oral  SpO2: 100%   Appears well, in no apparent distress. Oropharynx is dry. CV: RRR. Lungs are CTAB. Abdomen is soft without tenderness, guarding, mass, rebound or organomegaly. No CVA tenderness or inguinal adenopathy noted. LE without edema. Alert and oriented fully. Skin: warm and dry.  Labs Reviewed  POCT URINALYSIS DIP (DEVICE)    Allergies  Allergen Reactions  . Penicillins Rash  . Simvastatin Other (See Comments)    nightmares     Past Medical History:    Diagnosis Date  . Acute sinusitis   . Depression   . Diabetes mellitus   . Dysthymic disorder   . Hirsutism   . Hyperlipidemia   . Rash, skin   . Thyroid disease    Social History   Socioeconomic History  . Marital status: Married    Spouse name: Not on file  . Number of children: Not on file  . Years of education: Not on file  . Highest education level: Not on file  Occupational History  . Not on file  Social Needs  . Financial resource strain: Not on file  . Food insecurity:    Worry: Not on file    Inability: Not on file  . Transportation needs:    Medical: Not on file    Non-medical: Not on file  Tobacco Use  . Smoking status: Never Smoker  . Smokeless tobacco: Never Used  Substance and Sexual Activity  . Alcohol use: No    Alcohol/week: 0.0 standard drinks  . Drug use: No  . Sexual activity: Not on file  Lifestyle  . Physical activity:    Days per week: Not on file    Minutes per session: Not on file  . Stress: Not on file  Relationships  . Social connections:    Talks on phone: Not on file    Gets together: Not on file    Attends religious service: Not on file    Active member of club or organization: Not on file    Attends  meetings of clubs or organizations: Not on file    Relationship status: Not on file  . Intimate partner violence:    Fear of current or ex partner: Not on file    Emotionally abused: Not on file    Physically abused: Not on file    Forced sexual activity: Not on file  Other Topics Concern  . Not on file  Social History Narrative  . Not on file   Family History  Problem Relation Age of Onset  . Breast cancer Mother 82  . CAD Father   . CAD Maternal Grandmother   . Hypertension Maternal Grandmother   . Diabetes Paternal Grandfather   . Diabetes Paternal Robin Gails, MD 08/13/18 [phone]

## 2018-09-24 ENCOUNTER — Other Ambulatory Visit: Payer: Self-pay | Admitting: Internal Medicine

## 2018-09-24 DIAGNOSIS — Z1231 Encounter for screening mammogram for malignant neoplasm of breast: Secondary | ICD-10-CM

## 2018-10-29 ENCOUNTER — Ambulatory Visit
Admission: RE | Admit: 2018-10-29 | Discharge: 2018-10-29 | Disposition: A | Discharge status: Home / Self Care | Payer: BLUE CROSS/BLUE SHIELD | Source: Ambulatory Visit | Attending: Internal Medicine | Admitting: Internal Medicine

## 2018-10-29 ENCOUNTER — Ambulatory Visit: Payer: BLUE CROSS/BLUE SHIELD

## 2018-10-29 DIAGNOSIS — Z1231 Encounter for screening mammogram for malignant neoplasm of breast: Secondary | ICD-10-CM

## 2018-11-23 ENCOUNTER — Other Ambulatory Visit: Payer: Self-pay

## 2018-11-23 ENCOUNTER — Ambulatory Visit (INDEPENDENT_AMBULATORY_CARE_PROVIDER_SITE_OTHER): Payer: BLUE CROSS/BLUE SHIELD

## 2018-11-23 ENCOUNTER — Ambulatory Visit: Payer: BLUE CROSS/BLUE SHIELD | Admitting: Podiatry

## 2018-11-23 ENCOUNTER — Encounter: Payer: Self-pay | Admitting: Podiatry

## 2018-11-23 VITALS — BP 142/86 | HR 97 | Resp 16 | Ht 60.0 in | Wt 190.0 lb

## 2018-11-23 DIAGNOSIS — M19079 Primary osteoarthritis, unspecified ankle and foot: Secondary | ICD-10-CM

## 2018-11-23 DIAGNOSIS — M25374 Other instability, right foot: Secondary | ICD-10-CM

## 2018-11-23 DIAGNOSIS — M25571 Pain in right ankle and joints of right foot: Secondary | ICD-10-CM

## 2018-11-23 DIAGNOSIS — M25471 Effusion, right ankle: Secondary | ICD-10-CM | POA: Diagnosis not present

## 2018-11-23 DIAGNOSIS — M25371 Other instability, right ankle: Secondary | ICD-10-CM | POA: Diagnosis not present

## 2018-11-23 DIAGNOSIS — M722 Plantar fascial fibromatosis: Secondary | ICD-10-CM

## 2018-11-23 MED ORDER — MELOXICAM 15 MG PO TABS: 15.0000 mg | ORAL_TABLET | Freq: Every day | ORAL | 0 refills | Status: DC

## 2018-11-23 NOTE — Progress Notes (Signed)
   Subjective:    Patient ID: Robin Bonilla, female    DOB: January 12, 1956, 63 y.o.   MRN: [id number]  HPI    Review of Systems  Musculoskeletal: Positive for arthralgias, joint swelling and myalgias.  All other systems reviewed and are negative.      Objective:   Physical Exam        Assessment & Plan:

## 2018-11-23 NOTE — Patient Instructions (Signed)
Chronic Ankle Instability Rehab  Ask your health care provider which exercises are safe for you. Do exercises exactly as told by your health care provider and adjust them as directed. It is normal to feel mild stretching, pulling, tightness, or discomfort as you do these exercises, but you should stop right away if you feel sudden pain or your pain gets worse.Do not begin these exercises until told by your health care provider.  Strengthening exercises  These exercises build strength and endurance in your ankle. Endurance is the ability to use your muscles for a long time, even after they get tired.  Exercise A: Eversion  1. Sit on the floor with your legs straight out in front of you.  2. Loop a rubber exercise band around the ball of your left / right foot. The ball of your foot is on the walking surface, right under your toes. Hold the ends of the band in your hands, or secure the band to a stable object.  3. Slowly push your foot outward, away from your other leg.  4. Hold this position for __________ seconds.  5. Slowly return your foot to the starting position.  Repeat __________ times. Complete this exercise __________ times per day.  Exercise B: Heel walking (dorsiflexion)    Walk on your heels for __________ . Keep your toes as high as possible.  Repeat __________ times. Complete this exercise __________ times per day.  Exercise C: Toe walking (plantar flexion)    Walk on your toes for __________. Keep your heels as high as possible.  Repeat __________ times. Complete this exercise __________ times per day.  Balance exercises  These exercises improve or maintain your balance. Balance is important in improving ankle stability and preventing falls.  Exercise D: Tandem walking  Do this exercise in a hallway or room that is at least 10 ft. (3 m) long.  1. Stand with one foot directly in front of the other. You can use the walls to help you balance if needed, but try not to use them for support.  2. Slowly  lift your back foot and place it directly in front of your other foot.  3. Continue to walk in this heel-to-toe way for __________.  Repeat __________ times. Complete this exercise __________ times per day.  Exercise E: Single leg stand  1. Without shoes, stand near a railing or in a doorway. You may hold onto the railing or door frame as needed for balance.  2. Stand on your left / right foot. Keep your big toe down on the floor and try to keep your arch lifted. If this is too easy, you can try doing it while you do one of these actions:  ? Keep your eyes closed.  ? Stand on a pillow.  ? Throw a ball against a wall.  3. Hold this position for __________ seconds.  Repeat __________ times. Complete this exercise __________ times per day.  Exercise F: Inversion/eversion 1    You will need a balance board for this exercise. Ask your health care provider where you can get a balance board or how you can make one.  1. Stand on a non-carpeted surface near a countertop or wall.  2. Step onto the balance board so your feet are hip-width apart.  3. Keep your feet in place and keep your upper body and hips steady. Using only your feet and ankles, tip the board from side to side as far as you can, alternating between   steady position. Complete this exercise __________ times a day. Exercise G: Inversion/eversion 2 You will need a balance board for this exercise. Ask your health care provider where you can get a balance board or how you can make one. 1. Stand on a non-carpeted surface near a countertop or wall. 2. Step onto the balance board so your feet are hip-width apart. 3. Keep your feet in place and keep your upper body and hips steady. Using only your feet and ankles, tip the board from side to side, alternating between  tipping to the left and to the right. Do not let the board hit the floor at all. Repeat __________ times, pausing from time to time to hold a steady position. Complete this exercise __________ times a day. Exercise H: Plantar flexion/dorsiflexion 1  You will need a balance board for this exercise. Ask your health care provider where you can get a balance board or how you can make one. 1. Stand on a non-carpeted surface near a countertop or wall. 2. Step onto the balance board so your feet are hip-width apart. 3. Keep your feet in place and keep your upper body and hips steady. Using only your feet and ankles, tip the board forward and backward so the board silently taps the floor. Do not let the board forcefully hit the floor. Repeat __________ times, pausing from time to time to hold a steady position. Complete this exercise __________ times a day. Exercise I: Plantar flexion/dorsiflexion 2 You will need a balance board for this exercise. Ask your health care provider where you can get a balance board or how you can make one. 1. Stand on a non-carpeted surface near a countertop or wall. 2. Step onto the balance board so your feet are hip-width apart. 3. Keep your feet in place and keep your upper body and hips steady. Using only your feet and ankles, tip the board forward and back so the board does not hit the floor at all. Repeat __________ times, pausing from time to time to hold a steady position. Complete this exercise __________ times a day. This information is not intended to replace advice given to you by your health care provider. Make sure you discuss any questions you have with your health care provider. Document Released: 04/16/2005 Document Revised: 05/22/2016 Document Reviewed: 08/03/2015 Elsevier Interactive Patient Education  2019 [employer].

## 2018-11-24 ENCOUNTER — Telehealth: Payer: Self-pay | Admitting: *Deleted

## 2018-11-24 NOTE — Telephone Encounter (Signed)
Patients spouse called stating that she is having a lot of pain and having a hard time getting around in the boot.. Can you call in something for the pain and if we get FMLA paperwork can you take her out of work for a bit?  Please advise

## 2018-11-25 ENCOUNTER — Other Ambulatory Visit: Payer: Self-pay | Admitting: Podiatry

## 2018-11-25 DIAGNOSIS — M722 Plantar fascial fibromatosis: Secondary | ICD-10-CM

## 2018-11-25 DIAGNOSIS — M79671 Pain in right foot: Secondary | ICD-10-CM

## 2018-11-25 DIAGNOSIS — R2681 Unsteadiness on feet: Secondary | ICD-10-CM

## 2018-11-25 DIAGNOSIS — M25374 Other instability, right foot: Secondary | ICD-10-CM

## 2018-11-25 MED ORDER — TRAMADOL HCL 50 MG PO TABS: 50.0000 mg | ORAL_TABLET | Freq: Three times a day (TID) | ORAL | 0 refills | Status: DC | PRN

## 2018-11-27 NOTE — Progress Notes (Signed)
  Subjective:  Patient ID: Robin Bonilla, female    DOB: 04-28-1956,  MRN: [id number]  Chief Complaint  Patient presents with  . Foot Pain    Lt dorsal and Lateral ankle pain and swelling x 4 mo; 8/10 sharp dull ache -worse with weight Tx: tylenol -pt states she injury her ankle years ago    63 y.o. female presents with the above complaint.  Above history confirmed with patient   Review of Systems: Negative except as noted in the HPI. Denies N/V/F/Ch.  Past Medical History:  Diagnosis Date  . Acute sinusitis   . Depression   . Diabetes mellitus   . Dysthymic disorder   . Hirsutism   . Hyperlipidemia   . Rash, skin   . Thyroid disease     Current Outpatient Medications:  .  glimepiride (AMARYL) 4 MG tablet, Take 1 tablet by mouth daily., Disp: , Rfl: 4 .  JANUVIA 100 MG tablet, , Disp: , Rfl:  .  levothyroxine (SYNTHROID, LEVOTHROID) 125 MCG tablet, Take 125 mcg by mouth daily., Disp: , Rfl:  .  metFORMIN (GLUMETZA) 500 MG (MOD) 24 hr tablet, Take 1,000 mg by mouth 2 (two) times daily. , Disp: , Rfl:  .  meloxicam (MOBIC) 15 MG tablet, Take 1 tablet (15 mg total) by mouth daily., Disp: 30 tablet, Rfl: 0 .  traMADol (ULTRAM) 50 MG tablet, Take 1 tablet (50 mg total) by mouth every 8 (eight) hours as needed., Disp: 20 tablet, Rfl: 0  Social History   Tobacco Use  Smoking Status Never Smoker  Smokeless Tobacco Never Used    Allergies  Allergen Reactions  . Penicillins Rash  . Simvastatin Other (See Comments)    nightmares    Objective:   Vitals:   11/23/18 1610  BP: (!) 142/86  Pulse: 97  Resp: 16   Body mass index is 37.11 kg/m. Constitutional Well developed. Well nourished.  Vascular Dorsalis pedis pulses palpable bilaterally. Posterior tibial pulses palpable bilaterally. Capillary refill normal to all digits.  No cyanosis or clubbing noted. Pedal hair growth normal.  Neurologic Normal speech. Oriented to person, place, and time. Epicritic  sensation to light touch grossly present bilaterally.  Dermatologic Nails well groomed and normal in appearance. No open wounds. No skin lesions.  Orthopedic: Normal joint ROM without pain or crepitus bilaterally. No visible deformities. Pedal patient about the left dorsal foot, ATFL   Radiographs: Taken reviewed no acute fractures dislocations Assessment:   1. Arthritis of midfoot   2. Pain and swelling of right ankle   3. Ankle instability, right    Plan:  Patient was evaluated and treated and all questions answered.  Ankle instability left, -Refer to PT -Dispensed ankle brace  Dorsal arthritis midfoot -Rx meloxicam   Return in about 6 weeks (around 01/04/2019) for Ankle instability left, midfoot arthritis .

## 2019-01-04 ENCOUNTER — Ambulatory Visit: Payer: BLUE CROSS/BLUE SHIELD | Admitting: Podiatry

## 2019-10-05 ENCOUNTER — Other Ambulatory Visit: Payer: Self-pay | Admitting: Internal Medicine

## 2019-10-05 DIAGNOSIS — Z1231 Encounter for screening mammogram for malignant neoplasm of breast: Secondary | ICD-10-CM

## 2019-11-11 ENCOUNTER — Other Ambulatory Visit: Payer: Self-pay

## 2019-11-11 ENCOUNTER — Ambulatory Visit
Admission: RE | Admit: 2019-11-11 | Discharge: 2019-11-11 | Disposition: A | Discharge status: Home / Self Care | Payer: Managed Care, Other (non HMO) | Source: Ambulatory Visit | Attending: Internal Medicine | Admitting: Internal Medicine

## 2019-11-11 DIAGNOSIS — Z1231 Encounter for screening mammogram for malignant neoplasm of breast: Secondary | ICD-10-CM

## 2019-11-25 ENCOUNTER — Other Ambulatory Visit: Payer: Self-pay

## 2019-11-25 ENCOUNTER — Ambulatory Visit (INDEPENDENT_AMBULATORY_CARE_PROVIDER_SITE_OTHER): Payer: Managed Care, Other (non HMO) | Admitting: Nurse Practitioner

## 2019-11-25 ENCOUNTER — Encounter: Payer: Self-pay | Admitting: Nurse Practitioner

## 2019-11-25 VITALS — BP 156/84 | HR 85 | Temp 97.7°F | Ht 60.0 in | Wt 188.6 lb

## 2019-11-25 DIAGNOSIS — R42 Dizziness and giddiness: Secondary | ICD-10-CM | POA: Insufficient documentation

## 2019-11-25 DIAGNOSIS — H906 Mixed conductive and sensorineural hearing loss, bilateral: Secondary | ICD-10-CM | POA: Diagnosis not present

## 2019-11-25 DIAGNOSIS — E119 Type 2 diabetes mellitus without complications: Secondary | ICD-10-CM | POA: Diagnosis not present

## 2019-11-25 LAB — GLUCOSE, POCT (MANUAL RESULT ENTRY): POC Glucose: 189 mg/dl — AB (ref 70–99)

## 2019-11-25 MED ORDER — MECLIZINE HCL 25 MG PO TABS: 25.0000 mg | ORAL_TABLET | Freq: Three times a day (TID) | ORAL | 0 refills | Status: DC | PRN

## 2019-11-25 NOTE — Patient Instructions (Addendum)
Meclizine ordered as needed for dizziness.  MRI ordered to rule out acoustic neuroma. Patient to follow up as needed with worsening symptoms   Dizziness Dizziness is a common problem. It makes you feel unsteady or light-headed. You may feel like you are about to pass out (faint). Dizziness can lead to getting hurt if you stumble or fall. Dizziness can be caused by many things, including:  Medicines.  Not having enough water in your body (dehydration).  Illness. Follow these instructions at home: Eating and drinking   Drink enough fluid to keep your pee (urine) clear or pale yellow. This helps to keep you from getting dehydrated. Try to drink more clear fluids, such as water.  Do not drink alcohol.  Limit how much caffeine you drink or eat, if your doctor tells you to do that.  Limit how much salt (sodium) you drink or eat, if your doctor tells you to do that. Activity   Avoid making quick movements. ? When you stand up from sitting in a chair, steady yourself until you feel okay. ? In the morning, first sit up on the side of the bed. When you feel okay, stand slowly while you hold onto something. Do this until you know that your balance is fine.  If you need to stand in one place for a long time, move your legs often. Tighten and relax the muscles in your legs while you are standing.  Do not drive or use heavy machinery if you feel dizzy.  Avoid bending down if you feel dizzy. Place items in your home so you can reach them easily without leaning over. Lifestyle  Do not use any products that contain nicotine or tobacco, such as cigarettes and e-cigarettes. If you need help quitting, ask your doctor.  Try to lower your stress level. You can do this by using methods such as yoga or meditation. Talk with your doctor if you need help. General instructions  Watch your dizziness for any changes.  Take over-the-counter and prescription medicines only as told by your doctor. Talk  with your doctor if you think that you are dizzy because of a medicine that you are taking.  Tell a friend or a family member that you are feeling dizzy. If he or she notices any changes in your behavior, have this person call your doctor.  Keep all follow-up visits as told by your doctor. This is important. Contact a doctor if:  Your dizziness does not go away.  Your dizziness or light-headedness gets worse.  You feel sick to your stomach (nauseous).  You have trouble hearing.  You have new symptoms.  You are unsteady on your feet.  You feel like the room is spinning. Get help right away if:  You throw up (vomit) or have watery poop (diarrhea), and you cannot eat or drink anything.  You have trouble: ? Talking. ? Walking. ? Swallowing. ? Using your arms, hands, or legs.  You feel generally weak.  You are not thinking clearly, or you have trouble forming sentences. A friend or family member may notice this.  You have: ? Chest pain. ? Pain in your belly (abdomen). ? Shortness of breath. ? Sweating.  Your vision changes.  You are bleeding.  You have a very bad headache.  You have neck pain or a stiff neck.  You have a fever. These symptoms may be an emergency. Do not wait to see if the symptoms will go away. Get medical help right away. Call  your local emergency services (911 in the [place] Do not drive yourself to the hospital. Summary  Dizziness makes you feel unsteady or light-headed. You may feel like you are about to pass out (faint).  Drink enough fluid to keep your pee (urine) clear or pale yellow. Do not drink alcohol.  Avoid making quick movements if you feel dizzy.  Watch your dizziness for any changes. This information is not intended to replace advice given to you by your health care provider. Make sure you discuss any questions you have with your health care provider. Document Revised: 09/18/2017 Document Reviewed: 10/02/2016 Elsevier Patient  Education  [place].

## 2019-11-25 NOTE — Assessment & Plan Note (Signed)
Patient is managed by Endocrinologist.

## 2019-11-25 NOTE — Assessment & Plan Note (Signed)
MRI, ordered to rule out acoustic neuroma,

## 2019-11-25 NOTE — Progress Notes (Addendum)
Established Patient Office Visit  Subjective:  Patient ID: Robin Bonilla, female    DOB: 1956-09-26  Age: 64 y.o. MRN: [id number]  CC: Patient  is a 64 old female. She is here for vertigo.  The patient's medications were reviewed and reconciled since the patient's last visit.  History details were provided by the patient. The history appears to be reliable.   Chief Complaint  Patient presents with  . Diziness    HPI Robin Bonilla presents for Vertigo - Dizziness: Patient presents with dizziness .  The dizziness has been present for 7 days. The patient describes the symptoms as head spining when she rises or seats, blurred vision, intermittent hearing loss and, ears popping, Symptoms are exacerbated by movement.The patient also complains of dizziness when she turns her head side to side.   Patient denies Headaches or nausea,  This is a new problem for Robin Bonilla and symptoms are progressively worse. patient has had no previous work up.   Past Medical History:  Diagnosis Date  . Acute sinusitis   . Depression   . Diabetes mellitus   . Dysthymic disorder   . Hirsutism   . Hyperlipidemia   . Rash, skin   . Thyroid disease     Past Surgical History:  Procedure Laterality Date  . BACK SURGERY    . CHOLECYSTECTOMY  05/30/11  . PARTIAL HYSTERECTOMY    . TONSILLECTOMY    . TUBAL LIGATION      Family History  Problem Relation Age of Onset  . Breast cancer Mother 51  . CAD Father   . CAD Maternal Grandmother   . Hypertension Maternal Grandmother   . Diabetes Paternal Grandfather   . Diabetes Paternal Aunt     Social History   Socioeconomic History  . Marital status: Married    Spouse name: Not on file  . Number of children: Not on file  . Years of education: Not on file  . Highest education level: Not on file  Occupational History  . Not on file  Tobacco Use  . Smoking status: Never Smoker  . Smokeless tobacco: Never Used  Substance and Sexual Activity    . Alcohol use: No    Alcohol/week: 0.0 standard drinks  . Drug use: No  . Sexual activity: Not on file  Other Topics Concern  . Not on file  Social History Narrative  . Not on file   Social Determinants of Health   Financial Resource Strain:   . Difficulty of Paying Living Expenses: Not on file  Food Insecurity:   . Worried About Charity fundraiser in the Last Year: Not on file  . Ran Out of Food in the Last Year: Not on file  Transportation Needs:   . Lack of Transportation (Medical): Not on file  . Lack of Transportation (Non-Medical): Not on file  Physical Activity:   . Days of Exercise per Week: Not on file  . Minutes of Exercise per Session: Not on file  Stress:   . Feeling of Stress : Not on file  Social Connections:   . Frequency of Communication with Friends and Family: Not on file  . Frequency of Social Gatherings with Friends and Family: Not on file  . Attends Religious Services: Not on file  . Active Member of Clubs or Organizations: Not on file  . Attends Archivist Meetings: Not on file  . Marital Status: Not on file  Intimate Partner Violence:   .  Fear of Current or Ex-Partner: Not on file  . Emotionally Abused: Not on file  . Physically Abused: Not on file  . Sexually Abused: Not on file    Outpatient Medications Prior to Visit  Medication Sig Dispense Refill  . glimepiride (AMARYL) 4 MG tablet Take 1 tablet by mouth daily.  4  . JANUVIA 100 MG tablet     . Levothyroxine Sodium 137 MCG CAPS Take 137 mcg by mouth daily.     . meloxicam (MOBIC) 15 MG tablet Take 1 tablet (15 mg total) by mouth daily. 30 tablet 0  . metFORMIN (GLUMETZA) 500 MG (MOD) 24 hr tablet Take 1,000 mg by mouth 2 (two) times daily.     Marland Kitchen NOVOFINE 32G X 6 MM MISC USE AS DIRECTED ONCE A DAY SUBCUTANEOUS 30 DAYS    . traMADol (ULTRAM) 50 MG tablet Take 1 tablet (50 mg total) by mouth every 8 (eight) hours as needed. 20 tablet 0  . TRESIBA FLEXTOUCH 100 UNIT/ML SOPN FlexTouch  Pen INJECT 5 UNITS ONCE A DAY SUBCUTANEOUS 30 DAYS     No facility-administered medications prior to visit.    Allergies  Allergen Reactions  . Penicillins Rash  . Simvastatin Other (See Comments)    nightmares     ROS Review of Systems  Constitutional: Positive for activity change. Negative for appetite change, chills, fatigue and fever.  HENT: Positive for hearing loss and tinnitus. Negative for congestion, ear discharge, ear pain, facial swelling, postnasal drip, sinus pressure, sinus pain, sore throat, trouble swallowing and voice change.   Eyes: Negative for pain, discharge and redness.  Respiratory: Negative for cough, chest tightness and shortness of breath.   Cardiovascular: Negative for chest pain and palpitations.  Gastrointestinal: Negative for abdominal pain, nausea and vomiting.  Genitourinary: Negative for difficulty urinating.  Musculoskeletal: Positive for back pain. Negative for arthralgias and myalgias.  Skin: Negative for rash.  Neurological: Positive for dizziness. Negative for facial asymmetry, speech difficulty, weakness and headaches.  Psychiatric/Behavioral: Negative for agitation and hallucinations. The patient is not nervous/anxious.          Objective:    Physical Exam  Constitutional: She is oriented to person, place, and time. She appears well-developed and well-nourished. No distress.  HENT:  Head: Normocephalic.  Patient reports ringing in bilateral ear and intermittent hearing loss.  Eyes: Pupils are equal, round, and reactive to light. Right eye exhibits no discharge. Left eye exhibits no discharge.  Cardiovascular: Normal rate, regular rhythm and normal heart sounds.  Pulmonary/Chest: Breath sounds normal. No respiratory distress. She exhibits no tenderness.  Abdominal: Soft. Bowel sounds are normal. She exhibits no distension. There is no abdominal tenderness.  Musculoskeletal:     Cervical back: Normal range of motion and neck supple.    Neurological: She is alert and oriented to person, place, and time. She displays normal reflexes.  Skin: Skin is warm. No rash noted.  Psychiatric: She has a normal mood and affect. Her behavior is normal. Judgment normal.       BP (!) 156/84   Pulse 85   Temp 97.7 F (36.5 C)   Ht 5' (1.524 m)   Wt 188 lb 9.6 oz (85.5 kg)   SpO2 98%   BMI 36.83 kg/m  Wt Readings from Last 3 Encounters:  11/25/19 188 lb 9.6 oz (85.5 kg)  11/23/18 190 lb (86.2 kg)  08/02/18 180 lb (81.6 kg)     Health Maintenance Due  Topic Date Due  .  HEMOGLOBIN A1C  [birth year]  . Hepatitis C Screening  15-Nov-1955  . PNEUMOCOCCAL POLYSACCHARIDE VACCINE AGE 70-64 HIGH RISK  12/11/1959  . FOOT EXAM  12/11/1967  . OPHTHALMOLOGY EXAM  12/11/1967  . URINE MICROALBUMIN  12/11/1967  . HIV Screening  12/10/1972  . TETANUS/TDAP  12/10/1976  . PAP SMEAR-Modifier  12/11/1978  . COLONOSCOPY  12/11/2007  . INFLUENZA VACCINE  04/30/2019    There are no preventive care reminders to display for this patient.  No results found for: TSH Lab Results  Component Value Date   WBC 8.1 08/02/2018   HGB 13.4 08/02/2018   HCT 43.1 08/02/2018   MCV 82.9 08/02/2018   PLT 229 08/02/2018   Lab Results  Component Value Date   NA 139 08/02/2018   K 4.1 08/02/2018   CO2 26 08/02/2018   GLUCOSE 166 (H) 08/02/2018   BUN 12 08/02/2018   CREATININE 0.81 08/02/2018   BILITOT 1.2 08/02/2018   ALKPHOS 69 08/02/2018   AST 23 08/02/2018   ALT 27 08/02/2018   PROT 7.8 08/02/2018   ALBUMIN 3.8 08/02/2018   CALCIUM 9.5 08/02/2018   ANIONGAP 13 08/02/2018   GFR 70.16 11/16/2015   No results found for: CHOL No results found for: HDL No results found for: LDLCALC No results found for: TRIG No results found for: CHOLHDL No results found for: IEPP2R    Assessment & Plan:   Problem List Items Addressed This Visit      Endocrine   Type 2 diabetes mellitus without complication (HCC) - Primary    Patient is managed by  Endocrinologist.       Relevant Medications   TRESIBA FLEXTOUCH 100 UNIT/ML SOPN FlexTouch Pen   Other Relevant Orders   POCT glucose (manual entry) (Completed)   CBC with Differential/Platelet   Comprehensive metabolic panel   TSH     Nervous and Auditory   Mixed conductive and sensorineural hearing loss of both ears    MRI, ordered to rule out acoustic neuroma,      Relevant Orders   MR BRAIN/IAC W WO CONTRAST     Other   Vertigo     Meclizine 25 mg ordered as needed.      Relevant Medications   meclizine (ANTIVERT) 25 MG tablet   Other Relevant Orders   TSH   MR BRAIN/IAC W WO CONTRAST      Meds ordered this encounter  Medications  . meclizine (ANTIVERT) 25 MG tablet    Sig: Take 1 tablet (25 mg total) by mouth 3 (three) times daily as needed for dizziness.    Dispense:  60 tablet    Refill:  0    Order Specific Question:   Supervising Provider    AnswerCorey Harold    Follow-up: No follow-ups on file.    Daryll Drown, NP

## 2019-11-25 NOTE — Assessment & Plan Note (Addendum)
Meclizine 25 mg ordered as needed.

## 2019-11-26 LAB — CBC WITH DIFFERENTIAL/PLATELET
Basophils Absolute: 0.1 10*3/uL (ref 0.0–0.2)
Basos: 1 %
EOS (ABSOLUTE): 0.3 10*3/uL (ref 0.0–0.4)
Eos: 4 %
Hematocrit: 43.5 % (ref 34.0–46.6)
Hemoglobin: 14.2 g/dL (ref 11.1–15.9)
Immature Grans (Abs): 0 10*3/uL (ref 0.0–0.1)
Immature Granulocytes: 0 %
Lymphocytes Absolute: 3.2 10*3/uL — ABNORMAL HIGH (ref 0.7–3.1)
Lymphs: 43 %
MCH: 26 pg — ABNORMAL LOW (ref 26.6–33.0)
MCHC: 32.6 g/dL (ref 31.5–35.7)
MCV: 80 fL (ref 79–97)
Monocytes Absolute: 0.5 10*3/uL (ref 0.1–0.9)
Monocytes: 7 %
Neutrophils Absolute: 3.3 10*3/uL (ref 1.4–7.0)
Neutrophils: 45 %
Platelets: 268 10*3/uL (ref 150–450)
RBC: 5.46 x10E6/uL — ABNORMAL HIGH (ref 3.77–5.28)
RDW: 14.3 % (ref 11.7–15.4)
WBC: 7.4 10*3/uL (ref 3.4–10.8)

## 2019-11-26 LAB — COMPREHENSIVE METABOLIC PANEL
ALT: 28 IU/L (ref 0–32)
AST: 23 IU/L (ref 0–40)
Albumin/Globulin Ratio: 1.4 (ref 1.2–2.2)
Albumin: 4.4 g/dL (ref 3.8–4.8)
Alkaline Phosphatase: 100 IU/L (ref 39–117)
BUN/Creatinine Ratio: 13 (ref 12–28)
BUN: 11 mg/dL (ref 8–27)
Bilirubin Total: 0.6 mg/dL (ref 0.0–1.2)
CO2: 25 mmol/L (ref 20–29)
Calcium: 9.8 mg/dL (ref 8.7–10.3)
Chloride: 100 mmol/L (ref 96–106)
Creatinine, Ser: 0.84 mg/dL (ref 0.57–1.00)
GFR calc Af Amer: 87 mL/min/{1.73_m2} (ref >59)
GFR calc non Af Amer: 75 mL/min/{1.73_m2} (ref >59)
Globulin, Total: 3.1 g/dL (ref 1.5–4.5)
Glucose: 151 mg/dL — ABNORMAL HIGH (ref 65–99)
Potassium: 4.5 mmol/L (ref 3.5–5.2)
Sodium: 140 mmol/L (ref 134–144)
Total Protein: 7.5 g/dL (ref 6.0–8.5)

## 2019-11-26 LAB — TSH: TSH: 1.26 u[IU]/mL (ref 0.450–4.500)

## 2019-12-06 ENCOUNTER — Ambulatory Visit: Payer: Managed Care, Other (non HMO) | Admitting: Podiatry

## 2019-12-06 ENCOUNTER — Other Ambulatory Visit: Payer: Self-pay | Admitting: Podiatry

## 2019-12-06 ENCOUNTER — Ambulatory Visit (INDEPENDENT_AMBULATORY_CARE_PROVIDER_SITE_OTHER): Payer: Managed Care, Other (non HMO)

## 2019-12-06 ENCOUNTER — Other Ambulatory Visit: Payer: Self-pay

## 2019-12-06 DIAGNOSIS — M25572 Pain in left ankle and joints of left foot: Secondary | ICD-10-CM

## 2019-12-06 DIAGNOSIS — M79672 Pain in left foot: Secondary | ICD-10-CM

## 2019-12-06 DIAGNOSIS — M7672 Peroneal tendinitis, left leg: Secondary | ICD-10-CM | POA: Diagnosis not present

## 2019-12-06 DIAGNOSIS — M25372 Other instability, left ankle: Secondary | ICD-10-CM

## 2019-12-06 DIAGNOSIS — G8929 Other chronic pain: Secondary | ICD-10-CM

## 2019-12-06 NOTE — Progress Notes (Signed)
  Subjective:  Patient ID: Robin Bonilla, female    DOB: [birth year],  MRN: [id number]  Chief Complaint  Patient presents with  . Foot Pain    LT lateral ankle pain and laterla foot pain x couple months; occasional 10/10 pain -w/ swellgin -pt denies injury -wrose with walking Tx: tylenol   . Diabetes    FBS: dont check A1C; 7 PCP: COx x couple wks    64 y.o. female presents with the above complaint. History confirmed with patient.   Objective:  Physical Exam: warm, good capillary refill, no trophic changes or ulcerative lesions, normal DP and PT pulses and normal sensory exam. Left Foot: pain at ATFL, pain along peroneals, excessive hindfoot inversion, +anterior drawer left ankle.    No images are attached to the encounter.  Radiographs of the left ankle : no fracture or dislocation, talofibular diastasis noted laterally   Assessment:   1. Ankle instability, left   2. Chronic pain of left ankle   3. Peroneal tendonitis of left lower extremity      Plan:  Patient was evaluated and treated and all questions answered.  Ankle Instability, Peroneal Tendonitis Left -She has persistent pain on exam. She has failed immobilization and anti-inflammatory medication.Symptoms present for over 10 months.  - XR do show irregularity at the talofibular articulation. -She likely needs ATFL repair and augmentation. -Order MRI for further eval and surgical planning.  No follow-ups on file.

## 2019-12-12 ENCOUNTER — Other Ambulatory Visit: Payer: Self-pay | Admitting: Nurse Practitioner

## 2019-12-12 DIAGNOSIS — R42 Dizziness and giddiness: Secondary | ICD-10-CM

## 2019-12-20 ENCOUNTER — Ambulatory Visit: Payer: Managed Care, Other (non HMO) | Admitting: Podiatry

## 2019-12-21 ENCOUNTER — Other Ambulatory Visit: Payer: Self-pay | Admitting: Podiatry

## 2019-12-21 DIAGNOSIS — M7672 Peroneal tendinitis, left leg: Secondary | ICD-10-CM

## 2019-12-30 ENCOUNTER — Ambulatory Visit
Admission: RE | Admit: 2019-12-30 | Discharge: 2019-12-30 | Disposition: A | Discharge status: Home / Self Care | Payer: Managed Care, Other (non HMO) | Source: Ambulatory Visit | Attending: Nurse Practitioner | Admitting: Nurse Practitioner

## 2019-12-30 ENCOUNTER — Other Ambulatory Visit: Payer: Self-pay

## 2019-12-30 DIAGNOSIS — R42 Dizziness and giddiness: Secondary | ICD-10-CM

## 2020-01-04 ENCOUNTER — Other Ambulatory Visit (INDEPENDENT_AMBULATORY_CARE_PROVIDER_SITE_OTHER): Payer: Managed Care, Other (non HMO) | Admitting: Nurse Practitioner

## 2020-01-04 DIAGNOSIS — H9313 Tinnitus, bilateral: Secondary | ICD-10-CM

## 2020-01-10 NOTE — Progress Notes (Signed)
Referral to ENT °

## 2020-02-27 NOTE — Progress Notes (Signed)
This was not a visit. The patient called back and requested an ENT referral. No claim. kc

## 2020-05-21 ENCOUNTER — Telehealth (INDEPENDENT_AMBULATORY_CARE_PROVIDER_SITE_OTHER): Payer: Managed Care, Other (non HMO) | Admitting: Family Medicine

## 2020-05-21 ENCOUNTER — Encounter: Payer: Self-pay | Admitting: Family Medicine

## 2020-05-21 VITALS — Temp 96.5°F | Ht 60.0 in | Wt 186.0 lb

## 2020-05-21 DIAGNOSIS — Z20822 Contact with and (suspected) exposure to covid-19: Secondary | ICD-10-CM

## 2020-05-21 NOTE — Progress Notes (Addendum)
I connected with  Robin Bonilla on 05/21/20 by telephone  verified that I am speaking with the correct person using two identifiers.DOB, address   I discussed the limitations of evaluation and management by phone. The patient expressed understanding and agreed to proceed.  Two co-workers + for [employer]. No symptoms but concern for proximity.   pts husband with s/t and cough. No fever, diarrhea or loss of taste or smell. Review of Systems  Constitutional: Negative for chills and fever.  Respiratory: Negative for cough.   Cardiovascular: Negative for chest pain.  Gastrointestinal: Negative for diarrhea, heartburn, nausea and vomiting.  Genitourinary: Negative for dysuria.  Musculoskeletal: Negative for myalgias.  Skin: Negative for rash.  Neurological: Negative for headaches.   Pt in car in clinic parking lot Physician in clinic Today's Vitals   05/21/20 1505     Weight: 186 lb (84.4 kg)  Height: 5' (1.524 m)   Body mass index is 36.33 kg/m.  No exam   I discussed the assessment and treatment plan with the patient. The patient was provided an opportunity to ask questions and all were answered. The patient agreed with the plan and demonstrated an understanding of the instructions.  Will call with results when available  I provided 6 minutes of non-face-to-face time during this encounter.   Kaeli Nichelson Mat Carne, MD  1. Close exposure to COVID-19 virus  6 mins discussion with patient- D/w pt antibody testing-+ exposure-husband rapid negative

## 2020-05-21 NOTE — Addendum Note (Signed)
Addended by: Tawny Asal I on: 05/21/2020 04:00 PM   Modules accepted: Orders

## 2020-05-23 ENCOUNTER — Telehealth: Payer: Self-pay

## 2020-05-23 LAB — SARS-COV-2, NAA 2 DAY TAT

## 2020-05-23 LAB — NOVEL CORONAVIRUS, NAA: SARS-CoV-2, NAA: NOT DETECTED

## 2020-05-23 NOTE — Telephone Encounter (Signed)
Covid testing lab result given to patient by Dr. Sedalia Muta

## 2020-07-02 ENCOUNTER — Other Ambulatory Visit: Payer: Self-pay

## 2020-07-02 ENCOUNTER — Ambulatory Visit (INDEPENDENT_AMBULATORY_CARE_PROVIDER_SITE_OTHER): Payer: Managed Care, Other (non HMO) | Admitting: Family Medicine

## 2020-07-02 VITALS — BP 130/72 | HR 80 | Temp 97.3°F | Resp 18 | Ht 60.0 in | Wt 188.4 lb

## 2020-07-02 DIAGNOSIS — M1712 Unilateral primary osteoarthritis, left knee: Secondary | ICD-10-CM | POA: Diagnosis not present

## 2020-07-02 DIAGNOSIS — M25562 Pain in left knee: Secondary | ICD-10-CM

## 2020-07-02 MED ORDER — MELOXICAM 15 MG PO TABS: 15.0000 mg | ORAL_TABLET | Freq: Every day | ORAL | 1 refills | Status: DC

## 2020-07-02 NOTE — Progress Notes (Signed)
Acute Office Visit  Subjective:    Patient ID: Robin Bonilla, female    DOB: 03-May-1956, 64 y.o.   MRN: [id number]  Chief Complaint  Patient presents with  . Knee Pain    left     HPI: Patient is in today for evaluation of left knee pain that began 2 weeks ago. The pain is worse with prolonged standing and walking. 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 Robin Bonilla states that her knee is buckling intermittently and has affected gait. She said the left knee "giving out" has caused her to fall one time last week. She denies previous knee trauma or surgery. She does admit to a history of bilateral knee osteoarthritis. She rates the pain 8-10 on a 0-10 scale after working. Robin Bonilla stated the pain is accompanied by left knee swelling. She has taken Tylenol arthritis over-the-counter for pain. The pain in better with rest and elevation.  Past Medical History:  Diagnosis Date  . Acute sinusitis   . Depression   . Diabetes mellitus   . Dysthymic disorder   . Hirsutism   . Hyperlipidemia   . Rash, skin   . Thyroid disease     Past Surgical History:  Procedure Laterality Date  . BACK SURGERY    . CHOLECYSTECTOMY  05/30/11  . PARTIAL HYSTERECTOMY    . TONSILLECTOMY    . TUBAL LIGATION      Family History  Problem Relation Age of Onset  . Breast cancer Mother 38  . CAD Father   . CAD Maternal Grandmother   . Hypertension Maternal Grandmother   . Diabetes Paternal Grandfather   . Diabetes Paternal Aunt     Social History   Socioeconomic History  . Marital status: Married    Spouse name: Not on file  . Number of children: Not on file  . Years of education: Not on file  . Highest education level: Not on file  Occupational History  . Not on file  Tobacco Use  . Smoking status: Never Smoker  . Smokeless tobacco: Never Used  Substance and Sexual Activity  . Alcohol use: No    Alcohol/week: 0.0  standard drinks  . Drug use: No  . Sexual activity: Not on file  Other Topics Concern  . Not on file  Social History Narrative  . Not on file   Social Determinants of Health   Financial Resource Strain:   . Difficulty of Paying Living Expenses: Not on file  Food Insecurity:   . Worried About Charity fundraiser in the Last Year: Not on file  . Ran Out of Food in the Last Year: Not on file  Transportation Needs:   . Lack of Transportation (Medical): Not on file  . Lack of Transportation (Non-Medical): Not on file  Physical Activity:   . Days of Exercise per Week: Not on file  . Minutes of Exercise per Session: Not on file  Stress:   . Feeling of Stress : Not on file  Social Connections:   . Frequency of Communication with Friends and Family: Not on file  . Frequency of Social Gatherings with Friends and Family: Not on file  . Attends Religious Services: Not on file  . Active Member of Clubs or Organizations: Not on file  . Attends Archivist Meetings: Not on file  . Marital Status: Not on file  Intimate Partner Violence:   .  Fear of Current or Ex-Partner: Not on file  . Emotionally Abused: Not on file  . Physically Abused: Not on file  . Sexually Abused: Not on file    Outpatient Medications Prior to Visit  Medication Sig Dispense Refill  . glimepiride (AMARYL) 4 MG tablet Take 1 tablet by mouth 2 (two) times daily.   4  . JANUVIA 100 MG tablet     . Levothyroxine Sodium 137 MCG CAPS Take 137 mcg by mouth daily.     . meclizine (ANTIVERT) 25 MG tablet Take 1 tablet (25 mg total) by mouth 3 (three) times daily as needed for dizziness. 60 tablet 0  . metFORMIN (GLUMETZA) 500 MG (MOD) 24 hr tablet Take 1,000 mg by mouth 2 (two) times daily.     Marland Kitchen ACCU-CHEK GUIDE test strip TEST STRIPS TWICE A DAY IN VITRO 30 DAYS    . Accu-Chek Softclix Lancets lancets 3 (three) times daily.    . Blood Glucose Monitoring Suppl (ACCU-CHEK GUIDE) w/Device KIT daily. use as directed     . meloxicam (MOBIC) 15 MG tablet Take 1 tablet (15 mg total) by mouth daily. 30 tablet 0  . NOVOFINE 32G X 6 MM MISC USE AS DIRECTED ONCE A DAY SUBCUTANEOUS 30 DAYS    . traMADol (ULTRAM) 50 MG tablet Take 1 tablet (50 mg total) by mouth every 8 (eight) hours as needed. 20 tablet 0  . TRESIBA FLEXTOUCH 100 UNIT/ML SOPN FlexTouch Pen INJECT 5 UNITS ONCE A DAY SUBCUTANEOUS 30 DAYS     No facility-administered medications prior to visit.    Allergies  Allergen Reactions  . Penicillins Rash  . Simvastatin Other (See Comments)    nightmares     Review of Systems  Constitutional: Positive for activity change.  HENT: Negative.   Respiratory: Negative for cough and shortness of breath.   Cardiovascular: Negative for chest pain.  Gastrointestinal: Negative for constipation, diarrhea and nausea.  Musculoskeletal: Positive for arthralgias, gait problem and joint swelling. Negative for back pain.  Neurological: Negative for weakness and numbness.  Psychiatric/Behavioral: Negative.        Objective:    Physical Exam Constitutional:      Appearance: Normal appearance.  Cardiovascular:     Rate and Rhythm: Normal rate and regular rhythm.     Pulses: Normal pulses.     Heart sounds: Normal heart sounds.  Pulmonary:     Effort: Pulmonary effort is normal.     Breath sounds: Normal breath sounds.  Musculoskeletal:        General: Tenderness present.     Left lower leg: Tenderness and bony tenderness present.     Comments: Left patella tender with palpation with minimal edema noted. Grimacing noted with left knee extension and flexion.  Skin:    General: Skin is warm and dry.     Capillary Refill: Capillary refill takes less than 2 seconds.  Neurological:     Mental Status: She is alert and oriented to person, place, and time.  Psychiatric:        Mood and Affect: Mood normal.        Behavior: Behavior normal.     BP 130/72   Pulse 80   Temp (!) 97.3 F (36.3 C)   Resp 18    Ht 5' (1.524 m)   Wt 188 lb 6.4 oz (85.5 kg)   BMI 36.79 kg/m  Wt Readings from Last 3 Encounters:  07/02/20 188 lb 6.4 oz (85.5 kg)  05/21/20  186 lb (84.4 kg)  11/25/19 188 lb 9.6 oz (85.5 kg)    Health Maintenance Due  Topic Date Due  . HEMOGLOBIN A1C  Never done  . Hepatitis C Screening  Never done  . PNEUMOCOCCAL POLYSACCHARIDE VACCINE AGE 74-64 HIGH RISK  Never done  . FOOT EXAM  Never done  . OPHTHALMOLOGY EXAM  Never done  . URINE MICROALBUMIN  Never done  . COVID-19 Vaccine (1) Never done  . HIV Screening  Never done  . TETANUS/TDAP  Never done  . PAP SMEAR-Modifier  Never done  . COLONOSCOPY  Never done  . INFLUENZA VACCINE  04/29/2020    There are no preventive care reminders to display for this patient.   Lab Results  Component Value Date   TSH 1.260 11/25/2019   Lab Results  Component Value Date   WBC 7.4 11/25/2019   HGB 14.2 11/25/2019   HCT 43.5 11/25/2019   MCV 80 11/25/2019   PLT 268 11/25/2019   Lab Results  Component Value Date   NA 140 11/25/2019   K 4.5 11/25/2019   CO2 25 11/25/2019   GLUCOSE 151 (H) 11/25/2019   BUN 11 11/25/2019   CREATININE 0.84 11/25/2019   BILITOT 0.6 11/25/2019   ALKPHOS 100 11/25/2019   AST 23 11/25/2019   ALT 28 11/25/2019   PROT 7.5 11/25/2019   ALBUMIN 4.4 11/25/2019   CALCIUM 9.8 11/25/2019   ANIONGAP 13 08/02/2018   GFR 70.16 11/16/2015   No results found for: CHOL No results found for: HDL No results found for: LDLCALC No results found for: TRIG No results found for: CHOLHDL No results found for: HGBA1C     Assessment & Plan:   1. Primary osteoarthritis of left knee  Rest, ice, elevate left leg Avoid prolonged standing, walking No work for 1 week Begin Meloxicam 15 mg daily If no improvement schedule x-ray and notify clinic by phone for follow-up in 1 week  - meloxicam (MOBIC) 15 MG tablet; Take 1 tablet (15 mg total) by mouth daily.  Dispense: 60 tablet; Refill: 1  2. Acute pain of left  knee  Rest, ice, elevate left leg Avoid prolonged standing, walking No work for 1 week Begin Meloxicam 15 mg daily If no improvement schedule x-ray and notify clinic by phone for follow-up in 1 week   - DG Knee Complete 4 Views Left  Follow up prn  [name], MD

## 2020-07-02 NOTE — Patient Instructions (Signed)
Rest, ice, elevate left leg Avoid prolonged standing, walking No work for 1 week Begin Meloxicam 15 mg daily If no improvement schedule x-ray and notify clinic by phone for follow-up   Joint Pain  Joint pain can be caused by many things. It is likely to go away if you follow instructions from your doctor for taking care of yourself at home. Sometimes, you may need more treatment. Follow these instructions at home: Managing pain, stiffness, and swelling   If told, put ice on the painful area. ? Put ice in a plastic bag. ? Place a towel between your skin and the bag. ? Leave the ice on for 20 minutes, 2-3 times a day.  If told, put heat on the painful area. Do this as often as told by your doctor. Use the heat source that your doctor recommends, such as a moist heat pack or a heating pad. ? Place a towel between your skin and the heat source. ? Leave the heat on for 20-30 minutes. ? Take off the heat if your skin gets bright red. This is especially important if you are unable to feel pain, heat, or cold. You may have a greater risk of getting burned.  Move your fingers or toes below the painful joint often. This helps with stiffness and swelling.  If possible, raise (elevate) the painful joint above the level of your heart while you are sitting or lying down. To do this, try putting a few pillows under the painful joint. Activity  Rest the painful joint for as long as told. Do not do things that cause pain or make your pain worse.  Begin exercising or stretching the affected area, as told by your doctor. Ask your doctor what types of exercise are safe for you. If you have an elastic bandage, sling, or splint:  Wear the device as told by your doctor. Take it off only as told by your doctor.  Loosen the device if your fingers or toes below the joint: ? Tingle. ? Lose feeling (get numb). ? Get cold and blue.  Keep the device clean.  Ask your doctor if you should take off the  device before bathing. You may need to cover it with a watertight covering when you take a bath or a shower. General instructions  Take over-the-counter and prescription medicines only as told by your doctor.  Do not use any products that contain nicotine or tobacco. These include cigarettes and e-cigarettes. If you need help quitting, ask your doctor.  Keep all follow-up visits as told by your doctor. This is important. Contact a doctor if:  You have pain that gets worse and does not get better with medicine.  Your joint pain does not get better in 3 days.  You have more bruising or swelling.  You have a fever.  You lose 10 lb (4.5 kg) or more without trying. Get help right away if:  You cannot move the joint.  Your fingers or toes tingle, lose feeling, or get cold and blue.  You have a fever along with a joint that is red, warm, and swollen. Summary  Joint pain can be caused by many things. It often goes away if you follow instructions from your doctor for taking care of yourself at home.  Rest the painful joint for as long as told. Do not do things that cause pain or make your pain worse.  Take over-the-counter and prescription medicines only as told by your doctor. This information is  not intended to replace advice given to you by your health care provider. Make sure you discuss any questions you have with your health care provider. Document Revised: 08/28/2017 Document Reviewed: 07/01/2017 Elsevier Patient Education  2020 [employer] Acute Knee Pain, Adult Many things can cause knee pain. Sometimes, knee pain is sudden (acute) and may be caused by damage, swelling, or irritation of the muscles and tissues that support your knee. The pain often goes away on its own with time and rest. If the pain does not go away, tests may be done to find out what is causing the pain. Follow these instructions at home: Pay attention to any changes in your symptoms. Take these actions  to relieve your pain. If you have a knee sleeve or brace:   Wear the sleeve or brace as told by your doctor. Remove it only as told by your doctor.  Loosen the sleeve or brace if your toes: ? Tingle. ? Become numb. ? Turn cold and blue.  Keep the sleeve or brace clean.  If the sleeve or brace is not waterproof: ? Do not let it get wet. ? Cover it with a watertight covering when you take a bath or shower. Activity  Rest your knee.  Do not do things that cause pain.  Avoid activities where both feet leave the ground at the same time (high-impact activities). Examples are running, jumping rope, and doing jumping jacks.  Work with a physical therapist to make a safe exercise program, as told by your doctor. Managing pain, stiffness, and swelling   If told, put ice on the knee: ? Put ice in a plastic bag. ? Place a towel between your skin and the bag. ? Leave the ice on for 20 minutes, 2-3 times a day.  If told, put pressure (compression) on your injured knee to control swelling, give support, and help with discomfort. Compression may be done with an elastic bandage. General instructions  Take all medicines only as told by your doctor.  Raise (elevate) your knee while you are sitting or lying down. Make sure your knee is higher than your heart.  Sleep with a pillow under your knee.  Do not use any products that contain nicotine or tobacco. These include cigarettes, e-cigarettes, and chewing tobacco. These products may slow down healing. If you need help quitting, ask your doctor.  If you are overweight, work with your doctor and a food expert (dietitian) to set goals to lose weight. Being overweight can make your knee hurt more.  Keep all follow-up visits as told by your doctor. This is important. Contact a doctor if:  The knee pain does not stop.  The knee pain changes or gets worse.  You have a fever along with knee pain.  Your knee feels warm when you touch  it.  Your knee gives out or locks up. Get help right away if:  Your knee swells, and the swelling gets worse.  You cannot move your knee.  You have very bad knee pain. Summary  Many things can cause knee pain. The pain often goes away on its own with time and rest.  Your doctor may do tests to find out the cause of the pain.  Pay attention to any changes in your symptoms. Relieve your pain with rest, medicines, light activity, and use of ice. Get help right away if you cannot move your knee or your knee pain is very bad.  RICE Therapy for Routine Care of Injuries  Many injuries can be cared for with rest, ice, compression, and elevation (RICE therapy). This includes:  Resting the injured part.  Putting ice on the injury.  Putting pressure (compression) on the injury.  Raising the injured part (elevation). Using RICE therapy can help to lessen pain and swelling. Supplies needed:  Ice.  Plastic bag.  Towel.  Elastic bandage.  Pillow or pillows to raise (elevate) your injured body part. How to care for your injury with RICE therapy Rest Limit your normal activities, and try not to use the injured part of your body. You can go back to your normal activities when your doctor says it is okay to do them and you feel okay. Ask your doctor if you should do exercises to help your injury get better. Ice Put ice on the injured area. Do not put ice on your bare skin.  Put ice in a plastic bag.  Place a towel between your skin and the bag.  Leave the ice on for 20 minutes, 2-3 times a day. Use ice on as many days as told by your doctor.  Compression Compression means putting pressure on the injured area. This can be done with an elastic bandage. If an elastic bandage has been put on your injury:  Do not wrap the bandage too tight. Wrap the bandage more loosely if part of your body away from the bandage is blue, swollen, cold, painful, or loses feeling (gets numb).  Take off  the bandage and put it on again. Do this every 3-4 hours or as told by your doctor.  See your doctor if the bandage seems to make your problems worse.  Elevation Elevation means keeping the injured area raised. If you can, raise the injured area above your heart or the center of your chest. Contact a doctor if:  You keep having pain and swelling.  Your symptoms get worse. Get help right away if:  You have sudden bad pain at your injury or lower than your injury.  You have redness or more swelling around your injury.  You have tingling or numbness at your injury or lower than your injury, and it does not go away when you take off the bandage. Summary  Many injuries can be cared for using rest, ice, compression, and elevation (RICE therapy).  You can go back to your normal activities when you feel okay and your doctor says it is okay.  Put ice on the injured area as told by your doctor.  Get help if your symptoms get worse or if you keep having pain and swelling. This information is not intended to replace advice given to you by your health care provider. Make sure you discuss any questions you have with your health care provider. Document Revised: 06/05/2017 Document Reviewed: 06/05/2017 Elsevier Patient Education  2020 [employer]  Meloxicam capsules What is this medicine? MELOXICAM (mel OX i cam) is a non-steroidal anti-inflammatory drug (NSAID). It is used to reduce swelling and to treat pain. It is used for osteoarthritis. This medicine may be used for other purposes; ask your health care provider or pharmacist if you have questions. COMMON BRAND NAME(S): Vivlodex What should I tell my health care provider before I take this medicine? They need to know if you have any of these conditions:  bleeding disorders  cigarette smoker  coronary artery bypass graft (CABG) surgery within the past 2 weeks  drink more than 3 alcohol-containing drinks per day  heart  disease  high blood  pressure  history of stomach bleeding  kidney disease  liver disease  lung or breathing disease, like asthma  stomach or intestine problems  an unusual or allergic reaction to meloxicam, aspirin, other NSAIDs, other medicines, foods, dyes, or preservatives  pregnant or trying to get pregnant  breast-feeding How should I use this medicine? Take this medicine by mouth with a full glass of water. Follow the directions on the prescription label. You can take it with or without food. If it upsets your stomach, take it with food. Take your medicine at regular intervals. Do not take it more often than directed. Do not stop taking except on your doctor's advice. A special MedGuide will be given to you by the pharmacist with each prescription and refill. Be sure to read this information carefully each time. Talk to your pediatrician regarding the use of this medicine in children. Special care may be needed. Patients over 62 years old may have a stronger reaction and need a smaller dose. Overdosage: If you think you have taken too much of this medicine contact a poison control center or emergency room at once. NOTE: This medicine is only for you. Do not share this medicine with others. What if I miss a dose? If you miss a dose, take it as soon as you can. If it is almost time for your next dose, take only that dose. Do not take double or extra doses. What may interact with this medicine? Do not take this medicine with any of the following medications:  cidofovir  ketorolac This medicine may also interact with the following medications:  aspirin and aspirin-like medicines  certain medicines for blood pressure, heart disease, irregular heart beat  certain medicines for depression, anxiety, or psychotic disturbances  certain medicines that treat or prevent blood clots like warfarin, enoxaparin, dalteparin, apixaban, dabigatran,  rivaroxaban  cyclosporine  diuretics  fluconazole  lithium  methotrexate  other NSAIDs, medicines for pain and inflammation, like ibuprofen and naproxen  pemetrexed This list may not describe all possible interactions. Give your health care provider a list of all the medicines, herbs, non-prescription drugs, or dietary supplements you use. Also tell them if you smoke, drink alcohol, or use illegal drugs. Some items may interact with your medicine. What should I watch for while using this medicine? Tell your doctor or healthcare provider if your symptoms do not start to get better or if they get worse. This medicine may cause serious skin reactions. They can happen weeks to months after starting the medicine. Contact your healthcare provider right away if you notice fevers or flu-like symptoms with a rash. The rash may be red or purple and then turn into blisters or peeling of the skin. Or, you might notice a red rash with swelling of the face, lips or lymph nodes in your neck or under your arms. Do not take other medicines that contain aspirin, ibuprofen, or naproxen with this medicine. Side effects such as stomach upset, nausea, or ulcers may be more likely to occur. Many medicines available without a prescription should not be taken with this medicine. This medicine can cause ulcers and bleeding in the stomach and intestines at any time during treatment. This can happen with no warning and may cause death. There is increased risk with taking this medicine for a long time. Smoking, drinking alcohol, older age, and poor health can also increase risks. Call your doctor right away if you have stomach pain or blood in your vomit or stool. This  medicine does not prevent heart attack or stroke. In fact, this medicine may increase the chance of a heart attack or stroke. The chance may increase with longer use of this medicine and in people who have heart disease. If you take aspirin to prevent heart  attack or stroke, talk with your doctor or healthcare provider. What side effects may I notice from receiving this medicine? Side effects that you should report to your doctor or health care professional as soon as possible:  allergic reactions like skin rash, itching or hives, swelling of the face, lips, or tongue  nausea, vomiting  redness, blistering, peeling, or loosening of the skin, including inside the mouth  signs and symptoms of a blood clot such as breathing problems; changes in vision; chest pain; severe, sudden headache; pain, swelling, warmth in the leg; trouble speaking; sudden numbness or weakness of the face, arm, or leg  signs and symptoms of bleeding such as bloody or black, tarry stools; red or dark-brown urine; spitting up blood or brown material that looks like coffee grounds; red spots on the skin; unusual bruising or bleeding from the eye, gums, or nose  signs and symptoms of liver injury like dark yellow or brown urine; general ill feeling or flu-like symptoms; light-colored stools; loss of appetite; nausea; right upper belly pain; unusually weak or tired; yellowing of the eyes or skin  signs and symptoms of stroke like changes in vision; confusion; trouble speaking or understanding; severe headaches; sudden numbness or weakness of the face, arm, or leg; trouble walking; dizziness; loss of balance or coordination Side effects that usually do not require medical attention (report to your doctor or health care professional if they continue or are bothersome):  constipation  diarrhea  gas This list may not describe all possible side effects. Call your doctor for medical advice about side effects. You may report side effects to FDA at 1-800-FDA-1088. Where should I keep my medicine? Keep out of the reach of children. Store at room temperature between 15 and 30 degrees C (59 and 86 degrees F). Throw away any unused medicine after the expiration date. NOTE: This sheet is  a summary. It may not cover all possible information. If you have questions about this medicine, talk to your doctor, pharmacist, or health care provider.  2020 Elsevier/[employer] Standard (2018-12-15 11:19:03) This information is not intended to replace advice given to you by your health care provider. Make sure you discuss any questions you have with your health care provider. Document Revised: 02/25/2018 Document Reviewed: 02/25/2018 Elsevier Patient Education  2020 [employer].

## 2020-07-05 ENCOUNTER — Ambulatory Visit: Payer: Managed Care, Other (non HMO)

## 2020-07-06 ENCOUNTER — Encounter: Payer: Self-pay | Admitting: Family Medicine

## 2020-07-06 ENCOUNTER — Ambulatory Visit (INDEPENDENT_AMBULATORY_CARE_PROVIDER_SITE_OTHER): Payer: Managed Care, Other (non HMO) | Admitting: Family Medicine

## 2020-07-06 ENCOUNTER — Other Ambulatory Visit: Payer: Self-pay

## 2020-07-06 VITALS — BP 144/70 | HR 100 | Temp 97.4°F | Resp 18 | Ht 60.0 in | Wt 182.6 lb

## 2020-07-06 DIAGNOSIS — E038 Other specified hypothyroidism: Secondary | ICD-10-CM

## 2020-07-06 DIAGNOSIS — E782 Mixed hyperlipidemia: Secondary | ICD-10-CM | POA: Diagnosis not present

## 2020-07-06 DIAGNOSIS — E1169 Type 2 diabetes mellitus with other specified complication: Secondary | ICD-10-CM

## 2020-07-06 DIAGNOSIS — Z23 Encounter for immunization: Secondary | ICD-10-CM | POA: Diagnosis not present

## 2020-07-06 DIAGNOSIS — I10 Essential (primary) hypertension: Secondary | ICD-10-CM

## 2020-07-06 DIAGNOSIS — F332 Major depressive disorder, recurrent severe without psychotic features: Secondary | ICD-10-CM

## 2020-07-06 DIAGNOSIS — E1121 Type 2 diabetes mellitus with diabetic nephropathy: Secondary | ICD-10-CM

## 2020-07-06 MED ORDER — TRAZODONE HCL 50 MG PO TABS: 25.0000 mg | ORAL_TABLET | Freq: Every evening | ORAL | 0 refills | Status: DC | PRN

## 2020-07-06 MED ORDER — LISINOPRIL 10 MG PO TABS: 10.0000 mg | ORAL_TABLET | Freq: Every day | ORAL | 0 refills | Status: DC

## 2020-07-06 MED ORDER — CITALOPRAM HYDROBROMIDE 20 MG PO TABS: 20.0000 mg | ORAL_TABLET | Freq: Every day | ORAL | 0 refills | Status: DC

## 2020-07-06 NOTE — Patient Instructions (Addendum)
Diabetes: Check sugar daily in am. Keep a log.  Depression: Start citalopram 20 mg once daily. Sleep: Trazodone 50 mg 1/2 to 1 at night. High blood pressure and spilling protein in her urine: Lisinopril  daily  Managing Your Hypertension Hypertension is commonly called high blood pressure. This is when the force of your blood pressing against the walls of your arteries is too strong. Arteries are blood vessels that carry blood from your heart throughout your body. Hypertension forces the heart to work harder to pump blood, and may cause the arteries to become narrow or stiff. Having untreated or uncontrolled hypertension can cause heart attack, stroke, kidney disease, and other problems. What are blood pressure readings? A blood pressure reading consists of a higher number over a lower number. Ideally, your blood pressure should be below 120/80. The first ("top") number is called the systolic pressure. It is a measure of the pressure in your arteries as your heart beats. The second ("bottom") number is called the diastolic pressure. It is a measure of the pressure in your arteries as the heart relaxes. What does my blood pressure reading mean? Blood pressure is classified into four stages. Based on your blood pressure reading, your health care provider may use the following stages to determine what type of treatment you need, if any. Systolic pressure and diastolic pressure are measured in a unit called mm Hg. Normal  Systolic pressure: below 120.  Diastolic pressure: below 80. Elevated  Systolic pressure: 120-129.  Diastolic pressure: below 80. Hypertension stage 1  Systolic pressure: 130-139.  Diastolic pressure: 80-89. Hypertension stage 2  Systolic pressure: 140 or above.  Diastolic pressure: 90 or above. What health risks are associated with hypertension? Managing your hypertension is an important responsibility. Uncontrolled hypertension can lead to:  A heart attack.  A  stroke.  A weakened blood vessel (aneurysm).  Heart failure.  Kidney damage.  Eye damage.  Metabolic syndrome.  Memory and concentration problems. What changes can I make to manage my hypertension? Hypertension can be managed by making lifestyle changes and possibly by taking medicines. Your health care provider will help you make a plan to bring your blood pressure within a normal range. Eating and drinking   Eat a diet that is high in fiber and potassium, and low in salt (sodium), added sugar, and fat. An example eating plan is called the DASH (Dietary Approaches to Stop Hypertension) diet. To eat this way: ? Eat plenty of fresh fruits and vegetables. Try to fill half of your plate at each meal with fruits and vegetables. ? Eat whole grains, such as whole wheat pasta, brown rice, or whole grain bread. Fill about one quarter of your plate with whole grains. ? Eat low-fat diary products. ? Avoid fatty cuts of meat, processed or cured meats, and poultry with skin. Fill about one quarter of your plate with lean proteins such as fish, chicken without skin, beans, eggs, and tofu. ? Avoid premade and processed foods. These tend to be higher in sodium, added sugar, and fat.  Reduce your daily sodium intake. Most people with hypertension should eat less than 1,500 mg of sodium a day.  Limit alcohol intake to no more than 1 drink a day for nonpregnant women and 2 drinks a day for men. One drink equals 12 oz of beer, 5 oz of wine, or 1 oz of hard liquor. Lifestyle  Work with your health care provider to maintain a healthy body weight, or to lose weight. Ask  what an ideal weight is for you.  Get at least 30 minutes of exercise that causes your heart to beat faster (aerobic exercise) most days of the week. Activities may include walking, swimming, or biking.  Include exercise to strengthen your muscles (resistance exercise), such as weight lifting, as part of your weekly exercise routine. Try  to do these types of exercises for 30 minutes at least 3 days a week.  Do not use any products that contain nicotine or tobacco, such as cigarettes and e-cigarettes. If you need help quitting, ask your health care provider.  Control any long-term (chronic) conditions you have, such as high cholesterol or diabetes. Monitoring  Monitor your blood pressure at home as told by your health care provider. Your personal target blood pressure may vary depending on your medical conditions, your age, and other factors.  Have your blood pressure checked regularly, as often as told by your health care provider. Working with your health care provider  Review all the medicines you take with your health care provider because there may be side effects or interactions.  Talk with your health care provider about your diet, exercise habits, and other lifestyle factors that may be contributing to hypertension.  Visit your health care provider regularly. Your health care provider can help you create and adjust your plan for managing hypertension. Will I need medicine to control my blood pressure? Your health care provider may prescribe medicine if lifestyle changes are not enough to get your blood pressure under control, and if:  Your systolic blood pressure is 130 or higher.  Your diastolic blood pressure is 80 or higher. Take medicines only as told by your health care provider. Follow the directions carefully. Blood pressure medicines must be taken as prescribed. The medicine does not work as well when you skip doses. Skipping doses also puts you at risk for problems. Contact a health care provider if:  You think you are having a reaction to medicines you have taken.  You have repeated (recurrent) headaches.  You feel dizzy.  You have swelling in your ankles.  You have trouble with your vision. Get help right away if:  You develop a severe headache or confusion.  You have unusual weakness or  numbness, or you feel faint.  You have severe pain in your chest or abdomen.  You vomit repeatedly.  You have trouble breathing. Summary  Hypertension is when the force of blood pumping through your arteries is too strong. If this condition is not controlled, it may put you at risk for serious complications.  Your personal target blood pressure may vary depending on your medical conditions, your age, and other factors. For most people, a normal blood pressure is less than 120/80.  Hypertension is managed by lifestyle changes, medicines, or both. Lifestyle changes include weight loss, eating a healthy, low-sodium diet, exercising more, and limiting alcohol. This information is not intended to replace advice given to you by your health care provider. Make sure you discuss any questions you have with your health care provider. Document Revised: 01/07/2019 Document Reviewed: 08/13/2016 Elsevier Patient Education  2020 [employer].  Hypertension, Adult Hypertension is another name for high blood pressure. High blood pressure forces your heart to work harder to pump blood. This can cause problems over time. There are two numbers in a blood pressure reading. There is a top number (systolic) over a bottom number (diastolic). It is best to have a blood pressure that is below 120/80. Healthy choices can  help lower your blood pressure, or you may need medicine to help lower it. What are the causes? The cause of this condition is not known. Some conditions may be related to high blood pressure. What increases the risk?  Smoking.  Having type 2 diabetes mellitus, high cholesterol, or both.  Not getting enough exercise or physical activity.  Being overweight.  Having too much fat, sugar, calories, or salt (sodium) in your diet.  Drinking too much alcohol.  Having long-term (chronic) kidney disease.  Having a family history of high blood pressure.  Age. Risk increases with age.  Race.  You may be at higher risk if you are African American.  Gender. Men are at higher risk than women before age 48. After age 39, women are at higher risk than men.  Having obstructive sleep apnea.  Stress. What are the signs or symptoms?  High blood pressure may not cause symptoms. Very high blood pressure (hypertensive crisis) may cause: ? Headache. ? Feelings of worry or nervousness (anxiety). ? Shortness of breath. ? Nosebleed. ? A feeling of being sick to your stomach (nausea). ? Throwing up (vomiting). ? Changes in how you see. ? Very bad chest pain. ? Seizures. How is this treated?  This condition is treated by making healthy lifestyle changes, such as: ? Eating healthy foods. ? Exercising more. ? Drinking less alcohol.  Your health care provider may prescribe medicine if lifestyle changes are not enough to get your blood pressure under control, and if: ? Your top number is above 130. ? Your bottom number is above 80.  Your personal target blood pressure may vary. Follow these instructions at home: Eating and drinking   If told, follow the DASH eating plan. To follow this plan: ? Fill one half of your plate at each meal with fruits and vegetables. ? Fill one fourth of your plate at each meal with whole grains. Whole grains include whole-wheat pasta, brown rice, and whole-grain bread. ? Eat or drink low-fat dairy products, such as skim milk or low-fat yogurt. ? Fill one fourth of your plate at each meal with low-fat (lean) proteins. Low-fat proteins include fish, chicken without skin, eggs, beans, and tofu. ? Avoid fatty meat, cured and processed meat, or chicken with skin. ? Avoid pre-made or processed food.  Eat less than 1,500 mg of salt each day.  Do not drink alcohol if: ? Your doctor tells you not to drink. ? You are pregnant, may be pregnant, or are planning to become pregnant.  If you drink alcohol: ? Limit how much you use to:  0-1 drink a day for  women.  0-2 drinks a day for men. ? Be aware of how much alcohol is in your drink. In the [place] one drink equals one 12 oz bottle of beer (355 mL), one 5 oz glass of wine (148 mL), or one 1 oz glass of hard liquor (44 mL). Lifestyle   Work with your doctor to stay at a healthy weight or to lose weight. Ask your doctor what the best weight is for you.  Get at least 30 minutes of exercise most days of the week. This may include walking, swimming, or biking.  Get at least 30 minutes of exercise that strengthens your muscles (resistance exercise) at least 3 days a week. This may include lifting weights or doing Pilates.  Do not use any products that contain nicotine or tobacco, such as cigarettes, e-cigarettes, and chewing tobacco. If you need help quitting,  ask your doctor.  Check your blood pressure at home as told by your doctor.  Keep all follow-up visits as told by your doctor. This is important. Medicines  Take over-the-counter and prescription medicines only as told by your doctor. Follow directions carefully.  Do not skip doses of blood pressure medicine. The medicine does not work as well if you skip doses. Skipping doses also puts you at risk for problems.  Ask your doctor about side effects or reactions to medicines that you should watch for. Contact a doctor if you:  Think you are having a reaction to the medicine you are taking.  Have headaches that keep coming back (recurring).  Feel dizzy.  Have swelling in your ankles.  Have trouble with your vision. Get help right away if you:  Get a very bad headache.  Start to feel mixed up (confused).  Feel weak or numb.  Feel faint.  Have very bad pain in your: ? Chest. ? Belly (abdomen).  Throw up more than once.  Have trouble breathing. Summary  Hypertension is another name for high blood pressure.  High blood pressure forces your heart to work harder to pump blood.  For most people, a normal blood  pressure is less than 120/80.  Making healthy choices can help lower blood pressure. If your blood pressure does not get lower with healthy choices, you may need to take medicine. This information is not intended to replace advice given to you by your health care provider. Make sure you discuss any questions you have with your health care provider. Document Revised: 05/26/2018 Document Reviewed: 05/26/2018 Elsevier Patient Education  2020 [employer] Inc[employer]  Major Depressive Disorder, Adult Major depressive disorder (MDD) is a mental health condition. MDD often makes you feel sad, hopeless, or helpless. MDD can also cause symptoms in your body. MDD can affect your:  Work.  School.  Relationships.  Other normal activities. MDD can range from mild to very bad. It may occur once (single episode MDD). It can also occur many times (recurrent MDD). The main symptoms of MDD often include:  Feeling sad, depressed, or irritable most of the time.  Loss of interest. MDD symptoms also include:  Sleeping too much or too little.  Eating too much or too little.  A change in your weight.  Feeling tired (fatigue) or having low energy.  Feeling worthless.  Feeling guilty.  Trouble making decisions.  Trouble thinking clearly.  Thoughts of suicide or harming others.  Feeling weak.  Feeling agitated.  Keeping yourself from being around other people (isolation). Follow these instructions at home: Activity  Do these things as told by your doctor: ? Go back to your normal activities. ? Exercise regularly. ? Spend time outdoors. Alcohol  Talk with your doctor about how alcohol can affect your antidepressant medicines.  Do not drink alcohol. Or, limit how much alcohol you drink. ? This means no more than 1 drink a day for nonpregnant women and 2 drinks a day for men. One drink equals one of these:  12 oz of beer.  5 oz of wine.  1 oz of hard liquor. General instructions  Take  over-the-counter and prescription medicines only as told by your doctor.  Eat a healthy diet.  Get plenty of sleep.  Find activities that you enjoy. Make time to do them.  Think about joining a support group. Your doctor may be able to suggest a group for you.  Keep all follow-up visits as told by your  doctor. This is important. Where to find more information:  [employer]ness: ? www.nami.org  [employer]: ? http://www.maynard.net/www.nimh.nih.gov  National Suicide Prevention Lifeline: ? 365-127-92571-(775)303-4196. This is free, 24-hour help. Contact a doctor if:  Your symptoms get worse.  You have new symptoms. Get help right away if:  You self-harm.  You see, hear, taste, smell, or feel things that are not present (hallucinate). If you ever feel like you may hurt yourself or others, or have thoughts about taking your own life, get help right away. You can go to your nearest emergency department or call:  Your local emergency services (911 in the [place]  A suicide crisis helpline, such as the National Suicide Prevention Lifeline: ? (587)693-35331-(775)303-4196. This is open 24 hours a day. This information is not intended to replace advice given to you by your health care provider. Make sure you discuss any questions you have with your health care provider. Document Revised: 08/28/2017 Document Reviewed: 06/01/2016 Elsevier Patient Education  2020 Elsevier Inc.  Insomnia Insomnia is a sleep disorder that makes it difficult to fall asleep or stay asleep. Insomnia can cause fatigue, low energy, difficulty concentrating, mood swings, and poor performance at work or school. There are three different ways to classify insomnia:  Difficulty falling asleep.  Difficulty staying asleep.  Waking up too early in the morning. Any type of insomnia can be long-term (chronic) or short-term (acute). Both are common. Short-term insomnia usually lasts for three months or less.  Chronic insomnia occurs at least three times a week for longer than three months. What are the causes? Insomnia may be caused by another condition, situation, or substance, such as:  Anxiety.  Certain medicines.  Gastroesophageal reflux disease (GERD) or other gastrointestinal conditions.  Asthma or other breathing conditions.  Restless legs syndrome, sleep apnea, or other sleep disorders.  Chronic pain.  Menopause.  Stroke.  Abuse of alcohol, tobacco, or illegal drugs.  Mental health conditions, such as depression.  Caffeine.  Neurological disorders, such as Alzheimer's disease.  An overactive thyroid (hyperthyroidism). Sometimes, the cause of insomnia may not be known. What increases the risk? Risk factors for insomnia include:  Gender. Women are affected more often than men.  Age. Insomnia is more common as you get older.  Stress.  Lack of exercise.  Irregular work schedule or working night shifts.  Traveling between different time zones.  Certain medical and mental health conditions. What are the signs or symptoms? If you have insomnia, the main symptom is having trouble falling asleep or having trouble staying asleep. This may lead to other symptoms, such as:  Feeling fatigued or having low energy.  Feeling nervous about going to sleep.  Not feeling rested in the morning.  Having trouble concentrating.  Feeling irritable, anxious, or depressed. How is this diagnosed? This condition may be diagnosed based on:  Your symptoms and medical history. Your health care provider may ask about: ? Your sleep habits. ? Any medical conditions you have. ? Your mental health.  A physical exam. How is this treated? Treatment for insomnia depends on the cause. Treatment may focus on treating an underlying condition that is causing insomnia. Treatment may also include:  Medicines to help you sleep.  Counseling or therapy.  Lifestyle adjustments to help you  sleep better. Follow these instructions at home: Eating and drinking   Limit or avoid alcohol, caffeinated beverages, and cigarettes, especially close to bedtime. These can disrupt your sleep.  Do  not eat a large meal or eat spicy foods right before bedtime. This can lead to digestive discomfort that can make it hard for you to sleep. Sleep habits   Keep a sleep diary to help you and your health care provider figure out what could be causing your insomnia. Write down: ? When you sleep. ? When you wake up during the night. ? How well you sleep. ? How rested you feel the next day. ? Any side effects of medicines you are taking. ? What you eat and drink.  Make your bedroom a dark, comfortable place where it is easy to fall asleep. ? Put up shades or blackout curtains to block light from outside. ? Use a white noise machine to block noise. ? Keep the temperature cool.  Limit screen use before bedtime. This includes: ? Watching TV. ? Using your smartphone, tablet, or computer.  Stick to a routine that includes going to bed and waking up at the same times every day and night. This can help you fall asleep faster. Consider making a quiet activity, such as reading, part of your nighttime routine.  Try to avoid taking naps during the day so that you sleep better at night.  Get out of bed if you are still awake after 15 minutes of trying to sleep. Keep the lights down, but try reading or doing a quiet activity. When you feel sleepy, go back to bed. General instructions  Take over-the-counter and prescription medicines only as told by your health care provider.  Exercise regularly, as told by your health care provider. Avoid exercise starting several hours before bedtime.  Use relaxation techniques to manage stress. Ask your health care provider to suggest some techniques that may work well for you. These may include: ? Breathing exercises. ? Routines to release muscle  tension. ? Visualizing peaceful scenes.  Make sure that you drive carefully. Avoid driving if you feel very sleepy.  Keep all follow-up visits as told by your health care provider. This is important. Contact a health care provider if:  You are tired throughout the day.  You have trouble in your daily routine due to sleepiness.  You continue to have sleep problems, or your sleep problems get worse. Get help right away if:  You have serious thoughts about hurting yourself or someone else. If you ever feel like you may hurt yourself or others, or have thoughts about taking your own life, get help right away. You can go to your nearest emergency department or call:  Your local emergency services (911 in the [place]  A suicide crisis helpline, such as the National Suicide Prevention Lifeline at 603-605-2541. This is open 24 hours a day. Summary  Insomnia is a sleep disorder that makes it difficult to fall asleep or stay asleep.  Insomnia can be long-term (chronic) or short-term (acute).  Treatment for insomnia depends on the cause. Treatment may focus on treating an underlying condition that is causing insomnia.  Keep a sleep diary to help you and your health care provider figure out what could be causing your insomnia. This information is not intended to replace advice given to you by your health care provider. Make sure you discuss any questions you have with your health care provider. Document Revised: 08/28/2017 Document Reviewed: 06/25/2017 Elsevier Patient Education  2020 [employer] Inc[employer]   Diabetes Basics  Diabetes (diabetes mellitus) is a long-term (chronic) disease. It occurs when the body does not properly use sugar (glucose) that is released from  food after you eat. Diabetes may be caused by one or both of these problems:  Your pancreas does not make enough of a hormone called insulin.  Your body does not react in a normal way to insulin that it makes. Insulin lets  sugars (glucose) go into cells in your body. This gives you energy. If you have diabetes, sugars cannot get into cells. This causes high blood sugar (hyperglycemia). Follow these instructions at home: How is diabetes treated? You may need to take insulin or other diabetes medicines daily to keep your blood sugar in balance. Take your diabetes medicines every day as told by your doctor. List your diabetes medicines here: How do I manage my blood sugar?  Check your blood sugar levels using a blood glucose monitor as directed by your doctor. Your doctor will set treatment goals for you. Generally, you should have these blood sugar levels:  Before meals (preprandial): 80-130 mg/dL (1.6-1.0 mmol/L).  After meals (postprandial): below 180 mg/dL (10 mmol/L).  A1c level: less than 7%. Write down the times that you will check your blood sugar levels: Blood sugar checks  Time: _______________ Notes: ___________________________________  Time: _______________ Notes: ___________________________________  Time: _______________ Notes: ___________________________________  Time: _______________ Notes: ___________________________________  Time: _______________ Notes: ___________________________________  Time: _______________ Notes: ___________________________________  What do I need to know about low blood sugar? Low blood sugar is called hypoglycemia. This is when blood sugar is at or below 70 mg/dL (3.9 mmol/L). Symptoms may include:  Feeling: ? Hungry. ? Worried or nervous (anxious). ? Sweaty and clammy. ? Confused. ? Dizzy. ? Sleepy. ? Sick to your stomach (nauseous).  Having: ? A fast heartbeat. ? A headache. ? A change in your vision. ? Tingling or no feeling (numbness) around the mouth, lips, or tongue. ? Jerky movements that you cannot control (seizure).  Having trouble with: ? Moving (coordination). ? Sleeping. ? Passing out (fainting). ? Getting upset easily  (irritability). Treating low blood sugar To treat low blood sugar, eat or drink something sugary right away. If you can think clearly and swallow safely, follow the 15:15 rule:  Take 15 grams of a fast-acting carb (carbohydrate). Talk with your doctor about how much you should take.  Some fast-acting carbs are: ? Sugar tablets (glucose pills). Take 3-4 glucose pills. ? 6-8 pieces of hard candy. ? 4-6 oz (120-150 mL) of fruit juice. ? 4-6 oz (120-150 mL) of regular (not diet) soda. ? 1 Tbsp (15 mL) honey or sugar.  Check your blood sugar 15 minutes after you take the carb.  If your blood sugar is still at or below 70 mg/dL (3.9 mmol/L), take 15 grams of a carb again.  If your blood sugar does not go above 70 mg/dL (3.9 mmol/L) after 3 tries, get help right away.  After your blood sugar goes back to normal, eat a meal or a snack within 1 hour. Treating very low blood sugar If your blood sugar is at or below 54 mg/dL (3 mmol/L), you have very low blood sugar (severe hypoglycemia). This is an emergency. Do not wait to see if the symptoms will go away. Get medical help right away. Call your local emergency services (911 in the [place] Do not drive yourself to the hospital. Questions to ask your health care provider  Do I need to meet with a diabetes educator?  What equipment will I need to care for myself at home?  What diabetes medicines do I need? When should I take them?  How often do I need to check my blood sugar?  What number can I call if I have questions?  When is my next doctor's visit?  Where can I find a support group for people with diabetes? Where to find more information  American Diabetes Association: www.diabetes.org  American Association of Diabetes Educators: www.diabeteseducator.org/patient-resources Contact a doctor if:  Your blood sugar is at or above 240 mg/dL (43.3 mmol/L) for 2 days in a row.  You have been sick or have had a fever for 2 days or more,  and you are not getting better.  You have any of these problems for more than 6 hours: ? You cannot eat or drink. ? You feel sick to your stomach (nauseous). ? You throw up (vomit). ? You have watery poop (diarrhea). Get help right away if:  Your blood sugar is lower than 54 mg/dL (3 mmol/L).  You get confused.  You have trouble: ? Thinking clearly. ? Breathing. Summary  Diabetes (diabetes mellitus) is a long-term (chronic) disease. It occurs when the body does not properly use sugar (glucose) that is released from food after digestion.  Take insulin and diabetes medicines as told.  Check your blood sugar every day, as often as told.  Keep all follow-up visits as told by your doctor. This is important. This information is not intended to replace advice given to you by your health care provider. Make sure you discuss any questions you have with your health care provider. Document Revised: 06/08/2019 Document Reviewed: 12/18/2017 Elsevier Patient Education  2020 [employer].

## 2020-07-06 NOTE — Progress Notes (Signed)
Subjective:  Patient ID: Robin Bonilla, female    DOB: 1956/03/05  Age: 64 y.o. MRN: [id number]  Chief Complaint  Patient presents with  . Diabetes  . Hyperlipidemia  . Hypothyroidism    HPI The patient presents with history of ( type 2) diabetes mellitus without complications. Patient was diagnosed in 2005. Compliance with treatment has been good; the patient takes medication as directed , maintains a diabetic diet some time and an exercise regimen, follows up as directed , and is NOT keeping a glucose diary. Not checking sugars. Patient specifically denies associated symptoms, including blurred vision,  polydipsia, polyphagia and polyuria . Having a lot of fatigue. Patient denies hypoglycemia. In regard to preventative care, the patient performs foot self-exams daily and last ophthalmology exam was in 09/2019. Diabetes has not affected eyes.  Taking Januvia 100 mg once daily, metfomin er 500 mg one twice a day, and januvia 100 mg once daily.   Hyperlipidemia: Not on any medicines. Eating fairly healthy. Not exercising.   Hypothyroidism: On synthroid.  Depression: Poor sleep, depression, poor appetite. [place], no suicidal thoughts,  Previously on citalopram. Able to go to sleep. Awakens in the middle of the night. Gets up and watches tv. Sometimes can go back to sleep.   Current Outpatient Medications on File Prior to Visit  Medication Sig Dispense Refill  . ACCU-CHEK GUIDE test strip TEST STRIPS TWICE A DAY IN VITRO 30 DAYS    . Accu-Chek Softclix Lancets lancets 3 (three) times daily.    . Blood Glucose Monitoring Suppl (ACCU-CHEK GUIDE) w/Device KIT daily. use as directed    . glimepiride (AMARYL) 4 MG tablet Take 1 tablet by mouth 2 (two) times daily.   4  . JANUVIA 100 MG tablet     . meclizine (ANTIVERT) 25 MG tablet Take 1 tablet (25 mg total) by mouth 3 (three) times daily as needed for dizziness. 60 tablet 0  . meloxicam (MOBIC) 15 MG tablet Take 1 tablet (15 mg  total) by mouth daily. 60 tablet 1  . NOVOFINE 32G X 6 MM MISC USE AS DIRECTED ONCE A DAY SUBCUTANEOUS 30 DAYS     No current facility-administered medications on file prior to visit.   Past Medical History:  Diagnosis Date  . Acute sinusitis   . Depression   . Diabetes mellitus 2005  . Dysthymic disorder   . Hirsutism   . Hyperlipidemia   . Rash, skin   . Thyroid disease    Past Surgical History:  Procedure Laterality Date  . BACK SURGERY    . CHOLECYSTECTOMY  05/30/11  . PARTIAL HYSTERECTOMY    . TONSILLECTOMY    . TUBAL LIGATION      Family History  Problem Relation Age of Onset  . Breast cancer Mother 16  . CAD Father   . CAD Maternal Grandmother   . Hypertension Maternal Grandmother   . Diabetes Paternal Grandfather   . Diabetes Paternal Aunt    Social History   Socioeconomic History  . Marital status: Married    Spouse name: Not on file  . Number of children: Not on file  . Years of education: Not on file  . Highest education level: Not on file  Occupational History  . Not on file  Tobacco Use  . Smoking status: Never Smoker  . Smokeless tobacco: Never Used  Substance and Sexual Activity  . Alcohol use: No    Alcohol/week: 0.0 standard drinks  . Drug use: No  .  Sexual activity: Not on file  Other Topics Concern  . Not on file  Social History Narrative  . Not on file   Social Determinants of Health   Financial Resource Strain:   . Difficulty of Paying Living Expenses: Not on file  Food Insecurity:   . Worried About Charity fundraiser in the Last Year: Not on file  . Ran Out of Food in the Last Year: Not on file  Transportation Needs:   . Lack of Transportation (Medical): Not on file  . Lack of Transportation (Non-Medical): Not on file  Physical Activity:   . Days of Exercise per Week: Not on file  . Minutes of Exercise per Session: Not on file  Stress:   . Feeling of Stress : Not on file  Social Connections:   . Frequency of Communication  with Friends and Family: Not on file  . Frequency of Social Gatherings with Friends and Family: Not on file  . Attends Religious Services: Not on file  . Active Member of Clubs or Organizations: Not on file  . Attends Archivist Meetings: Not on file  . Marital Status: Not on file    Review of Systems  Constitutional: Negative for chills, fatigue and fever.  HENT: Negative for congestion, ear pain and sore throat.   Respiratory: Negative for cough and shortness of breath.   Cardiovascular: Negative for chest pain.  Gastrointestinal: Negative for abdominal pain, constipation, diarrhea, nausea and vomiting.  Genitourinary: Negative for dysuria and urgency.  Musculoskeletal: Negative for arthralgias and myalgias.  Skin: Negative for rash.  Neurological: Negative for dizziness and headaches.  Psychiatric/Behavioral: Negative for dysphoric mood. The patient is not nervous/anxious.      Objective:  BP (!) 144/70   Pulse 100   Temp (!) 97.4 F (36.3 C)   Resp 18   Ht 5' (1.524 m)   Wt 182 lb 9.6 oz (82.8 kg)   BMI 35.66 kg/m   BP/Weight 07/06/2020 07/02/2020 12/23/7122  Systolic BP 580 998 -  Diastolic BP 70 72 -  Wt. (Lbs) 182.6 188.4 186  BMI 35.66 36.79 36.33    Physical Exam Vitals reviewed.  Constitutional:      Appearance: Normal appearance. She is normal weight.  Neck:     Vascular: No carotid bruit.  Cardiovascular:     Rate and Rhythm: Normal rate and regular rhythm.     Pulses: Normal pulses.     Heart sounds: Normal heart sounds.  Pulmonary:     Effort: Pulmonary effort is normal. No respiratory distress.     Breath sounds: Normal breath sounds.  Abdominal:     General: Abdomen is flat. Bowel sounds are normal.     Palpations: Abdomen is soft.     Tenderness: There is no abdominal tenderness.  Neurological:     Mental Status: She is alert and oriented to person, place, and time.  Psychiatric:        Mood and Affect: Mood normal.        Behavior:  Behavior normal.    Diabetic Foot Exam - Simple   Simple Foot Form Diabetic Foot exam was performed with the following findings: Yes 07/06/2020  9:29 AM  Visual Inspection No deformities, no ulcerations, no other skin breakdown bilaterally: Yes Sensation Testing Intact to touch and monofilament testing bilaterally: Yes Pulse Check Posterior Tibialis and Dorsalis pulse intact bilaterally: Yes Comments      Lab Results  Component Value Date   WBC 8.1  07/06/2020   HGB 15.0 07/06/2020   HCT 45.8 07/06/2020   PLT 239 07/06/2020   GLUCOSE 165 (H) 07/06/2020   CHOL 224 (H) 07/06/2020   TRIG 241 (H) 07/06/2020   HDL 36 (L) 07/06/2020   LDLCALC 144 (H) 07/06/2020   ALT 36 (H) 07/06/2020   AST 32 07/06/2020   NA 139 07/06/2020   K 4.4 07/06/2020   CL 98 07/06/2020   CREATININE 0.82 07/06/2020   BUN 14 07/06/2020   CO2 23 07/06/2020   TSH 4.570 (H) 07/06/2020   HGBA1C 9.3 (H) 07/06/2020   Assessment & Plan:  1. Mixed hyperlipidemia Poorly controlled.  Start on crestor 20 mg one daily. Start on vascepa 1 gm 2 capsules twice daily.  Continue to work on eating a healthy diet and exercise.  Labs drawn today.  - Lipid panel  2. Secondary hypothyroidism Increase synthroid to 150 mcg once in am. - TSH  3. Diabetic glomerulopathy (HCC) Control: poor Recommend check sugars fasting daily. Recommend check feet daily. Recommend annual eye exams. Medicines: CHANGE METFORMIN TO [name] 06/999 MG ONCE DAILY IN AM. Start on lisinopril 10 mg once daily. Continue to work on eating a healthy diet and exercise.  Labs drawn today.   - POCT UA - Microalbumin - Hemoglobin A1c  4. Severe episode of recurrent major depressive disorder, without psychotic features ([place]) Start on celexa 20 mg once daily. Start on trazodone 50 mg 1/2-1 at night.   5. Essential hypertension, benign Not at goal.  Start on lisinopril 10 mg once daily.  Continue to work on eating a healthy diet and  exercise.  Labs drawn today.  - CBC with Differential/Platelet - Comprehensive metabolic panel  6. Encounter for immunization - Flu Vaccine MDCK QUAD PF  Meds ordered this encounter  Medications  . citalopram (CELEXA) 20 MG tablet    Sig: Take 1 tablet (20 mg total) by mouth daily.    Dispense:  30 tablet    Refill:  0  . traZODone (DESYREL) 50 MG tablet    Sig: Take 0.5-1 tablets (25-50 mg total) by mouth at bedtime as needed for sleep.    Dispense:  30 tablet    Refill:  0  . lisinopril (ZESTRIL) 10 MG tablet    Sig: Take 1 tablet (10 mg total) by mouth daily.    Dispense:  30 tablet    Refill:  0    Orders Placed This Encounter  Procedures  . Flu Vaccine MDCK QUAD PF  . CBC with Differential/Platelet  . Comprehensive metabolic panel  . Hemoglobin A1c  . Lipid panel  . TSH  . Cardiovascular Risk Assessment  . POCT UA - Microalbumin     Follow-up: Return in about 4 weeks (around 08/03/2020) for depression, sugars (may see Jerrell Belfast, NP or Dr. Tobie Poet).  An After Visit Summary was printed and given to the patient.  [name] [facility] [phone]

## 2020-07-07 LAB — COMPREHENSIVE METABOLIC PANEL
ALT: 36 IU/L — ABNORMAL HIGH (ref 0–32)
AST: 32 IU/L (ref 0–40)
Albumin/Globulin Ratio: 1.5 (ref 1.2–2.2)
Albumin: 4.7 g/dL (ref 3.8–4.8)
Alkaline Phosphatase: 99 IU/L (ref 44–121)
BUN/Creatinine Ratio: 17 (ref 12–28)
BUN: 14 mg/dL (ref 8–27)
Bilirubin Total: 1 mg/dL (ref 0.0–1.2)
CO2: 23 mmol/L (ref 20–29)
Calcium: 9.9 mg/dL (ref 8.7–10.3)
Chloride: 98 mmol/L (ref 96–106)
Creatinine, Ser: 0.82 mg/dL (ref 0.57–1.00)
GFR calc Af Amer: 89 mL/min/{1.73_m2} (ref >59)
GFR calc non Af Amer: 77 mL/min/{1.73_m2} (ref >59)
Globulin, Total: 3.2 g/dL (ref 1.5–4.5)
Glucose: 165 mg/dL — ABNORMAL HIGH (ref 65–99)
Potassium: 4.4 mmol/L (ref 3.5–5.2)
Sodium: 139 mmol/L (ref 134–144)
Total Protein: 7.9 g/dL (ref 6.0–8.5)

## 2020-07-07 LAB — CBC WITH DIFFERENTIAL/PLATELET
Basophils Absolute: 0.1 10*3/uL (ref 0.0–0.2)
Basos: 1 %
EOS (ABSOLUTE): 0.3 10*3/uL (ref 0.0–0.4)
Eos: 4 %
Hematocrit: 45.8 % (ref 34.0–46.6)
Hemoglobin: 15 g/dL (ref 11.1–15.9)
Immature Grans (Abs): 0 10*3/uL (ref 0.0–0.1)
Immature Granulocytes: 0 %
Lymphocytes Absolute: 3.3 10*3/uL — ABNORMAL HIGH (ref 0.7–3.1)
Lymphs: 40 %
MCH: 26.6 pg (ref 26.6–33.0)
MCHC: 32.8 g/dL (ref 31.5–35.7)
MCV: 81 fL (ref 79–97)
Monocytes Absolute: 0.5 10*3/uL (ref 0.1–0.9)
Monocytes: 6 %
Neutrophils Absolute: 3.9 10*3/uL (ref 1.4–7.0)
Neutrophils: 49 %
Platelets: 239 10*3/uL (ref 150–450)
RBC: 5.63 x10E6/uL — ABNORMAL HIGH (ref 3.77–5.28)
RDW: 14 % (ref 11.7–15.4)
WBC: 8.1 10*3/uL (ref 3.4–10.8)

## 2020-07-07 LAB — LIPID PANEL
Chol/HDL Ratio: 6.2 ratio — ABNORMAL HIGH (ref 0.0–4.4)
Cholesterol, Total: 224 mg/dL — ABNORMAL HIGH (ref 100–199)
HDL: 36 mg/dL — ABNORMAL LOW (ref >39)
LDL Chol Calc (NIH): 144 mg/dL — ABNORMAL HIGH (ref 0–99)
Triglycerides: 241 mg/dL — ABNORMAL HIGH (ref 0–149)
VLDL Cholesterol Cal: 44 mg/dL — ABNORMAL HIGH (ref 5–40)

## 2020-07-07 LAB — HEMOGLOBIN A1C
Est. average glucose Bld gHb Est-mCnc: 220 mg/dL
Hgb A1c MFr Bld: 9.3 % — ABNORMAL HIGH (ref 4.8–5.6)

## 2020-07-07 LAB — TSH: TSH: 4.57 u[IU]/mL — ABNORMAL HIGH (ref 0.450–4.500)

## 2020-07-07 LAB — CARDIOVASCULAR RISK ASSESSMENT

## 2020-07-08 ENCOUNTER — Other Ambulatory Visit: Payer: Self-pay | Admitting: Family Medicine

## 2020-07-08 DIAGNOSIS — E119 Type 2 diabetes mellitus without complications: Secondary | ICD-10-CM

## 2020-07-08 DIAGNOSIS — E1169 Type 2 diabetes mellitus with other specified complication: Secondary | ICD-10-CM

## 2020-07-08 DIAGNOSIS — E782 Mixed hyperlipidemia: Secondary | ICD-10-CM

## 2020-07-08 DIAGNOSIS — E039 Hypothyroidism, unspecified: Secondary | ICD-10-CM

## 2020-07-09 ENCOUNTER — Other Ambulatory Visit: Payer: Self-pay

## 2020-07-09 ENCOUNTER — Other Ambulatory Visit: Payer: Self-pay | Admitting: Family Medicine

## 2020-07-09 ENCOUNTER — Encounter: Payer: Self-pay | Admitting: Family Medicine

## 2020-07-09 DIAGNOSIS — E785 Hyperlipidemia, unspecified: Secondary | ICD-10-CM

## 2020-07-09 DIAGNOSIS — E1169 Type 2 diabetes mellitus with other specified complication: Secondary | ICD-10-CM

## 2020-07-09 MED ORDER — XIGDUO XR 5-1000 MG PO TB24: 1.0000 | ORAL_TABLET | Freq: Every day | ORAL | 2 refills | Status: DC

## 2020-07-09 MED ORDER — LEVOTHYROXINE SODIUM 150 MCG PO TABS: 150.0000 ug | ORAL_TABLET | Freq: Every day | ORAL | 2 refills | Status: DC

## 2020-07-09 MED ORDER — ICOSAPENT ETHYL 1 G PO CAPS: 2.0000 g | ORAL_CAPSULE | Freq: Two times a day (BID) | ORAL | 2 refills | Status: DC

## 2020-07-09 MED ORDER — ROSUVASTATIN CALCIUM 20 MG PO TABS: 20.0000 mg | ORAL_TABLET | Freq: Every day | ORAL | 2 refills | Status: DC

## 2020-07-11 ENCOUNTER — Telehealth: Payer: Self-pay

## 2020-07-11 NOTE — Telephone Encounter (Signed)
Robin Bonilla called to report that she is not feeling better.  She is complaining of dull headaches and just not feeling well.  Her knee is feeling better with the meloxicam and she has been taking the lisinopril as prescribed.  She is taking the samples of synjardy and her blood sugar is 234, 213 and 226 over the last 3 days.  Her bp has been 136/75, 119/77, and 123/84.  She has not picked up the Vascepa or the Crestor.  She did increase the synthroid.

## 2020-07-11 NOTE — Telephone Encounter (Signed)
Dull headache. No infectious symptoms.  Resolved now.  Several new medicines. She may be adjusting.  Recommend hydration..  Call back I worsening or new sympt

## 2020-07-13 ENCOUNTER — Encounter: Payer: Self-pay | Admitting: Family Medicine

## 2020-07-14 ENCOUNTER — Encounter: Payer: Self-pay | Admitting: Family Medicine

## 2020-07-14 LAB — POCT UA - MICROALBUMIN: Microalbumin Ur, POC: 80 mg/L

## 2020-07-29 ENCOUNTER — Other Ambulatory Visit: Payer: Self-pay | Admitting: Family Medicine

## 2020-07-29 DIAGNOSIS — F332 Major depressive disorder, recurrent severe without psychotic features: Secondary | ICD-10-CM

## 2020-07-29 DIAGNOSIS — I10 Essential (primary) hypertension: Secondary | ICD-10-CM

## 2020-07-29 DIAGNOSIS — E1121 Type 2 diabetes mellitus with diabetic nephropathy: Secondary | ICD-10-CM

## 2020-08-02 ENCOUNTER — Ambulatory Visit: Payer: Managed Care, Other (non HMO) | Admitting: Nurse Practitioner

## 2020-08-09 ENCOUNTER — Other Ambulatory Visit: Payer: Self-pay | Admitting: Nurse Practitioner

## 2020-08-09 MED ORDER — ACCU-CHEK GUIDE VI STRP: ORAL_STRIP | 2 refills | Status: DC

## 2020-09-18 ENCOUNTER — Other Ambulatory Visit: Payer: Self-pay

## 2020-09-20 MED ORDER — JANUVIA 100 MG PO TABS: 100.0000 mg | ORAL_TABLET | Freq: Every day | ORAL | 0 refills | Status: DC

## 2020-10-03 ENCOUNTER — Other Ambulatory Visit: Payer: Self-pay | Admitting: Family Medicine

## 2020-10-07 ENCOUNTER — Other Ambulatory Visit: Payer: Self-pay | Admitting: Family Medicine

## 2020-10-08 ENCOUNTER — Telehealth (INDEPENDENT_AMBULATORY_CARE_PROVIDER_SITE_OTHER): Payer: Managed Care, Other (non HMO) | Admitting: Nurse Practitioner

## 2020-10-08 ENCOUNTER — Encounter: Payer: Self-pay | Admitting: Nurse Practitioner

## 2020-10-08 ENCOUNTER — Ambulatory Visit: Payer: Managed Care, Other (non HMO) | Admitting: Nurse Practitioner

## 2020-10-08 DIAGNOSIS — J019 Acute sinusitis, unspecified: Secondary | ICD-10-CM | POA: Diagnosis not present

## 2020-10-08 DIAGNOSIS — Z6834 Body mass index (BMI) 34.0-34.9, adult: Secondary | ICD-10-CM

## 2020-10-08 DIAGNOSIS — U071 COVID-19: Secondary | ICD-10-CM

## 2020-10-08 LAB — POC COVID19 BINAXNOW: SARS Coronavirus 2 Ag: POSITIVE — AB

## 2020-10-08 MED ORDER — LORATADINE 10 MG PO TABS: 10.0000 mg | ORAL_TABLET | Freq: Every day | ORAL | 0 refills | Status: DC

## 2020-10-08 MED ORDER — FLUTICASONE PROPIONATE 50 MCG/ACT NA SUSP: 2.0000 | Freq: Every day | NASAL | 1 refills | Status: DC

## 2020-10-08 MED ORDER — AZITHROMYCIN 250 MG PO TABS: ORAL_TABLET | ORAL | 0 refills | Status: DC

## 2020-10-08 NOTE — Progress Notes (Deleted)
Virtual Visit via Telephone Note   This visit type was conducted due to national recommendations for restrictions regarding the COVID-19 Pandemic (e.g. social distancing) in an effort to limit this patient's exposure and mitigate transmission in our community.  Due to her co-morbid illnesses, this patient is at least at moderate risk for complications without adequate follow up.  This format is felt to be most appropriate for this patient at this time.  The patient did not have access to video technology/had technical difficulties with video requiring transitioning to audio format only (telephone).  All issues noted in this document were discussed and addressed.  No physical exam could be performed with this format.  Patient verbally consented to a telehealth visit.   Date:  10/08/2020   ID:  Robin Bonilla, DOB 03-05-1956, MRN [id number]  {Patient Location:[phone]::"Home"} {Provider Location:[phone]::"Home Office"}  PCP:  [name], MD   Evaluation Performed:  {Choose Visit LMBE:[id number]::"OFHQRF-XJ Visit"}  Chief Complaint:  ***  History of Present Illness:    Robin Bonilla is a 65 y.o. female with runny nose started last week, cough worsened on Friday. Has been using cough drops. Productive cough- no color to sputum. Has not tried any otc medications.     Past Medical History:  Diagnosis Date  . Acute sinusitis   . Depression   . Diabetes mellitus 2005  . Dysthymic disorder   . Hirsutism   . Hyperlipidemia   . Rash, skin   . Thyroid disease     Past Surgical History:  Procedure Laterality Date  . BACK SURGERY    . CHOLECYSTECTOMY  05/30/11  . PARTIAL HYSTERECTOMY    . TONSILLECTOMY    . TUBAL LIGATION      Family History  Problem Relation Age of Onset  . Breast cancer Mother 63  . CAD Father   . CAD Maternal Grandmother   . Hypertension Maternal Grandmother   . Diabetes Paternal Grandfather   . Diabetes Paternal Aunt     Social  History   Socioeconomic History  . Marital status: Married    Spouse name: Not on file  . Number of children: Not on file  . Years of education: Not on file  . Highest education level: Not on file  Occupational History  . Not on file  Tobacco Use  . Smoking status: Never Smoker  . Smokeless tobacco: Never Used  Substance and Sexual Activity  . Alcohol use: No    Alcohol/week: 0.0 standard drinks  . Drug use: No  . Sexual activity: Not on file  Other Topics Concern  . Not on file  Social History Narrative  . Not on file   Social Determinants of Health   Financial Resource Strain: Not on file  Food Insecurity: Not on file  Transportation Needs: Not on file  Physical Activity: Not on file  Stress: Not on file  Social Connections: Not on file  Intimate Partner Violence: Not on file    Outpatient Medications Prior to Visit  Medication Sig Dispense Refill  . ACCU-CHEK GUIDE test strip TEST STRIPS TWICE A DAY IN VITRO 30 DAYS 100 each 2  . Accu-Chek Softclix Lancets lancets 3 (three) times daily.    . Blood Glucose Monitoring Suppl (ACCU-CHEK GUIDE) w/Device KIT daily. use as directed    . citalopram (CELEXA) 20 MG tablet TAKE 1 TABLET BY MOUTH EVERY DAY 90 tablet 1  . Dapagliflozin-metFORMIN HCl ER ([name]) 01-999 MG TB24 Take 1 tablet by mouth daily.  STOP METFORMIN. 30 tablet 2  . glimepiride (AMARYL) 4 MG tablet Take 1 tablet by mouth 2 (two) times daily.   4  . icosapent Ethyl (VASCEPA) 1 g capsule Take 2 capsules (2 g total) by mouth 2 (two) times daily. 120 capsule 2  . JANUVIA 100 MG tablet Take 1 tablet (100 mg total) by mouth daily. 30 tablet 0  . levothyroxine (SYNTHROID) 150 MCG tablet TAKE 1 TABLET BY MOUTH DAILY BEFORE BREAKFAST. 30 tablet 0  . lisinopril (ZESTRIL) 10 MG tablet TAKE 1 TABLET BY MOUTH EVERY DAY 90 tablet 0  . meclizine (ANTIVERT) 25 MG tablet Take 1 tablet (25 mg total) by mouth 3 (three) times daily as needed for dizziness. 60 tablet 0  .  meloxicam (MOBIC) 15 MG tablet Take 1 tablet (15 mg total) by mouth daily. 60 tablet 1  . NOVOFINE 32G X 6 MM MISC USE AS DIRECTED ONCE A DAY SUBCUTANEOUS 30 DAYS    . rosuvastatin (CRESTOR) 20 MG tablet TAKE 1 TABLET BY MOUTH EVERY DAY 30 tablet 0  . traZODone (DESYREL) 50 MG tablet TAKE 1/2 TO 1 TABLET BY MOUTH AT BEDTIME AS NEEDED FOR SLEEP. 90 tablet 1   No facility-administered medications prior to visit.    Allergies:   Penicillins and Simvastatin   Social History   Tobacco Use  . Smoking status: Never Smoker  . Smokeless tobacco: Never Used  Substance Use Topics  . Alcohol use: No    Alcohol/week: 0.0 standard drinks  . Drug use: No     Review of Systems  Constitutional: Positive for chills. Negative for fever.  HENT: Positive for congestion. Negative for sore throat.   Respiratory: Positive for cough. Negative for shortness of breath.   Cardiovascular: Negative for chest pain.  Gastrointestinal: Positive for diarrhea. Negative for nausea and vomiting.  Neurological: Positive for headaches. Negative for dizziness.     Labs/Other Tests and Data Reviewed:    Recent Labs: 07/06/2020: ALT 36; BUN 14; Creatinine, Ser 0.82; Hemoglobin 15.0; Platelets 239; Potassium 4.4; Sodium 139; TSH 4.570   Recent Lipid Panel Lab Results  Component Value Date/Time   CHOL 224 (H) 07/06/2020 10:19 AM   TRIG 241 (H) 07/06/2020 10:19 AM   HDL 36 (L) 07/06/2020 10:19 AM   CHOLHDL 6.2 (H) 07/06/2020 10:19 AM   LDLCALC 144 (H) 07/06/2020 10:19 AM    Wt Readings from Last 3 Encounters:  07/06/20 182 lb 9.6 oz (82.8 kg)  07/02/20 188 lb 6.4 oz (85.5 kg)  05/21/20 186 lb (84.4 kg)     Objective:    Vital Signs:  There were no vitals taken for this visit.   Physical Exam   ASSESSMENT & PLAN:   There are no diagnoses linked to this encounter.   No orders of the defined types were placed in this encounter.    No orders of the defined types were placed in this  encounter.   COVID-19 Education: The signs and symptoms of COVID-19 were discussed with the patient and how to seek care for testing (follow up with PCP or arrange E-visit). The importance of social distancing was discussed today.   I spent < time > minutes dedicated to the care of this patient on the date of this encounter to include face-to-face time with the patient, as well as: ***  Follow Up:  {F/U [phone]} {follow up:15908}  Signed,  Arsenio Katz, CMA  10/08/2020 2:47 PM    [place]

## 2020-10-08 NOTE — Progress Notes (Signed)
Virtual Visit via Telephone Note   This visit type was conducted due to national recommendations for restrictions regarding the COVID-19 Pandemic (e.g. social distancing) in an effort to limit this patient's exposure and mitigate transmission in our community.  Due to her co-morbid illnesses, this patient is at least at moderate risk for complications without adequate follow up.  This format is felt to be most appropriate for this patient at this time.  The patient did not have access to video technology/had technical difficulties with video requiring transitioning to audio format only (telephone).  All issues noted in this document were discussed and addressed.  No physical exam could be performed with this format.  Patient verbally consented to a telehealth visit.   Date:  10/08/2020   ID:  Robin Bonilla, DOB 1956/03/03, MRN [id number]  Patient Location: Home Provider Location: Home Office  PCP:  [name], MD   Evaluation Performed:  Established patient, acute telemedicine visit  Chief Complaint:  Sinus congestion  History of Present Illness:    Robin Bonilla is a 65 y.o. female with sinus congestion, rhinorrhea, post-nasal-drip, fatigue, chills, and cough. Onset of symptoms was 10-days ago.Treatment has included Tylenol and cough drops. She states her spouse is also sick with the same symptoms. She states she has not received COVID-19 vaccines but has received seasonal flu immunization. She has multiple co-morbidities of Type 2DM, hypertension, hyperlipidemia, and hypothyroidism.   The patient does have symptoms concerning for COVID-19 infection (fever, chills, cough, or new shortness of breath).    Past Medical History:  Diagnosis Date  . Acute sinusitis   . Depression   . Diabetes mellitus 2005  . Dysthymic disorder   . Hirsutism   . Hyperlipidemia   . Rash, skin   . Thyroid disease     Past Surgical History:  Procedure Laterality Date  . BACK SURGERY     . CHOLECYSTECTOMY  05/30/11  . PARTIAL HYSTERECTOMY    . TONSILLECTOMY    . TUBAL LIGATION      Family History  Problem Relation Age of Onset  . Breast cancer Mother 26  . CAD Father   . CAD Maternal Grandmother   . Hypertension Maternal Grandmother   . Diabetes Paternal Grandfather   . Diabetes Paternal Aunt     Social History   Socioeconomic History  . Marital status: Married    Spouse name: Not on file  . Number of children: Not on file  . Years of education: Not on file  . Highest education level: Not on file  Occupational History  . Not on file  Tobacco Use  . Smoking status: Never Smoker  . Smokeless tobacco: Never Used  Substance and Sexual Activity  . Alcohol use: No    Alcohol/week: 0.0 standard drinks  . Drug use: No  . Sexual activity: Not on file  Other Topics Concern  . Not on file  Social History Narrative  . Not on file   Social Determinants of Health   Financial Resource Strain: Not on file  Food Insecurity: Not on file  Transportation Needs: Not on file  Physical Activity: Not on file  Stress: Not on file  Social Connections: Not on file  Intimate Partner Violence: Not on file    Outpatient Medications Prior to Visit  Medication Sig Dispense Refill  . ACCU-CHEK GUIDE test strip TEST STRIPS TWICE A DAY IN VITRO 30 DAYS 100 each 2  . Accu-Chek Softclix Lancets lancets 3 (three) times  daily.    . Blood Glucose Monitoring Suppl (ACCU-CHEK GUIDE) w/Device KIT daily. use as directed    . citalopram (CELEXA) 20 MG tablet TAKE 1 TABLET BY MOUTH EVERY DAY 90 tablet 1  . Dapagliflozin-metFORMIN HCl ER ([name]) 01-999 MG TB24 Take 1 tablet by mouth daily. STOP METFORMIN. 30 tablet 2  . glimepiride (AMARYL) 4 MG tablet Take 1 tablet by mouth 2 (two) times daily.   4  . icosapent Ethyl (VASCEPA) 1 g capsule Take 2 capsules (2 g total) by mouth 2 (two) times daily. 120 capsule 2  . JANUVIA 100 MG tablet Take 1 tablet (100 mg total) by mouth daily. 30  tablet 0  . levothyroxine (SYNTHROID) 150 MCG tablet TAKE 1 TABLET BY MOUTH DAILY BEFORE BREAKFAST. 30 tablet 0  . lisinopril (ZESTRIL) 10 MG tablet TAKE 1 TABLET BY MOUTH EVERY DAY 90 tablet 0  . meclizine (ANTIVERT) 25 MG tablet Take 1 tablet (25 mg total) by mouth 3 (three) times daily as needed for dizziness. 60 tablet 0  . meloxicam (MOBIC) 15 MG tablet Take 1 tablet (15 mg total) by mouth daily. 60 tablet 1  . NOVOFINE 32G X 6 MM MISC USE AS DIRECTED ONCE A DAY SUBCUTANEOUS 30 DAYS    . rosuvastatin (CRESTOR) 20 MG tablet TAKE 1 TABLET BY MOUTH EVERY DAY 30 tablet 0  . traZODone (DESYREL) 50 MG tablet TAKE 1/2 TO 1 TABLET BY MOUTH AT BEDTIME AS NEEDED FOR SLEEP. 90 tablet 1   No facility-administered medications prior to visit.    Allergies:   Penicillins and Simvastatin   Social History   Tobacco Use  . Smoking status: Never Smoker  . Smokeless tobacco: Never Used  Substance Use Topics  . Alcohol use: No    Alcohol/week: 0.0 standard drinks  . Drug use: No     Review of Systems  Constitutional: Positive for chills and malaise/fatigue.  HENT: Positive for congestion, ear pain (bilateral ear fullness), sinus pain and sore throat. Negative for ear discharge.        Post-nasal drip  Eyes: Negative for pain.  Respiratory: Positive for cough and sputum production (clear). Negative for shortness of breath and wheezing.   Cardiovascular: Positive for chest pain. Negative for palpitations.  Gastrointestinal: Negative for abdominal pain, constipation, diarrhea, nausea and vomiting.  Genitourinary: Negative for dysuria, frequency and urgency.  Musculoskeletal: Negative for back pain, joint pain and myalgias.  Skin: Negative for rash.  Neurological: Positive for headaches.  Endo/Heme/Allergies: Negative for environmental allergies.     Labs/Other Tests and Data Reviewed:    Recent Labs: 07/06/2020: ALT 36; BUN 14; Creatinine, Ser 0.82; Hemoglobin 15.0; Platelets 239; Potassium  4.4; Sodium 139; TSH 4.570   Recent Lipid Panel Lab Results  Component Value Date/Time   CHOL 224 (H) 07/06/2020 10:19 AM   TRIG 241 (H) 07/06/2020 10:19 AM   HDL 36 (L) 07/06/2020 10:19 AM   CHOLHDL 6.2 (H) 07/06/2020 10:19 AM   LDLCALC 144 (H) 07/06/2020 10:19 AM    Wt Readings from Last 3 Encounters:  07/06/20 182 lb 9.6 oz (82.8 kg)  07/02/20 188 lb 6.4 oz (85.5 kg)  05/21/20 186 lb (84.4 kg)     Objective:    Vital Signs:  There were no vitals taken for this visit.   Physical Exam No physical exam performed   ASSESSMENT & PLAN:    1. COVID-19 - fluticasone (FLONASE) 50 MCG/ACT nasal spray; Place 2 sprays into both nostrils daily.  Dispense: 16 g; Refill: 1 - loratadine (CLARITIN) 10 MG tablet; Take 1 tablet (10 mg total) by mouth daily.  Dispense: 90 tablet; Refill: 0 - azithromycin (ZITHROMAX) 250 MG tablet; Take two tablets by mouth on day one, take one tablet by mouth day two-five  Dispense: 6 tablet; Refill: 0  2. Acute non-recurrent sinusitis, unspecified location - fluticasone (FLONASE) 50 MCG/ACT nasal spray; Place 2 sprays into both nostrils daily.  Dispense: 16 g; Refill: 1 - loratadine (CLARITIN) 10 MG tablet; Take 1 tablet (10 mg total) by mouth daily.  Dispense: 90 tablet; Refill: 0 - azithromycin (ZITHROMAX) 250 MG tablet; Take two tablets by mouth on day one, take one tablet by mouth day two-five  Dispense: 6 tablet; Refill: 0 - POC COVID-19  3. BMI 34.0-34.9,adult     COVID-19 results are positive, 10 day of symptom onset, pt no longer qualifies for MAB infusion Pt telephoned with results and instructions: Rest and push fluids Take medications as prescribed Seek emergency care for chest pain, shortness of breath, or any health concerns Notify office if symptoms worsen or fail to improve  COVID-19 Education: The signs and symptoms of COVID-19 were discussed with the patient and how to seek care for testing (follow up with PCP or arrange E-visit).  The importance of social distancing was discussed today.   I spent 10 minutes dedicated to the care of this patient on the date of this encounter to include telephone time with the patient, as well as:EMR review and prescription medication management.  Follow Up:  Virtual Visit  prn  Signed,  Rip Harbour, NP  10/08/2020 2:47 PM    [place]

## 2020-10-11 ENCOUNTER — Ambulatory Visit: Payer: Managed Care, Other (non HMO) | Admitting: Nurse Practitioner

## 2020-10-23 ENCOUNTER — Other Ambulatory Visit: Payer: Self-pay | Admitting: Family Medicine

## 2020-10-23 DIAGNOSIS — E1121 Type 2 diabetes mellitus with diabetic nephropathy: Secondary | ICD-10-CM

## 2020-10-23 DIAGNOSIS — I10 Essential (primary) hypertension: Secondary | ICD-10-CM

## 2020-10-27 ENCOUNTER — Other Ambulatory Visit: Payer: Self-pay | Admitting: Family Medicine

## 2020-10-30 ENCOUNTER — Other Ambulatory Visit: Payer: Self-pay | Admitting: Nurse Practitioner

## 2020-10-30 DIAGNOSIS — U071 COVID-19: Secondary | ICD-10-CM

## 2020-10-30 DIAGNOSIS — M1712 Unilateral primary osteoarthritis, left knee: Secondary | ICD-10-CM

## 2020-10-30 DIAGNOSIS — J019 Acute sinusitis, unspecified: Secondary | ICD-10-CM

## 2020-11-03 ENCOUNTER — Other Ambulatory Visit: Payer: Self-pay | Admitting: Nurse Practitioner

## 2020-11-03 DIAGNOSIS — J019 Acute sinusitis, unspecified: Secondary | ICD-10-CM

## 2020-11-03 DIAGNOSIS — U071 COVID-19: Secondary | ICD-10-CM

## 2020-11-05 ENCOUNTER — Ambulatory Visit (INDEPENDENT_AMBULATORY_CARE_PROVIDER_SITE_OTHER): Payer: Managed Care, Other (non HMO) | Admitting: Family Medicine

## 2020-11-05 ENCOUNTER — Other Ambulatory Visit: Payer: Self-pay

## 2020-11-05 ENCOUNTER — Encounter: Payer: Self-pay | Admitting: Family Medicine

## 2020-11-05 VITALS — BP 108/72 | HR 84 | Temp 96.8°F | Ht 60.0 in | Wt 171.0 lb

## 2020-11-05 DIAGNOSIS — J0100 Acute maxillary sinusitis, unspecified: Secondary | ICD-10-CM | POA: Diagnosis not present

## 2020-11-05 DIAGNOSIS — E782 Mixed hyperlipidemia: Secondary | ICD-10-CM

## 2020-11-05 DIAGNOSIS — R42 Dizziness and giddiness: Secondary | ICD-10-CM | POA: Diagnosis not present

## 2020-11-05 DIAGNOSIS — E1169 Type 2 diabetes mellitus with other specified complication: Secondary | ICD-10-CM | POA: Diagnosis not present

## 2020-11-05 DIAGNOSIS — E039 Hypothyroidism, unspecified: Secondary | ICD-10-CM | POA: Diagnosis not present

## 2020-11-05 MED ORDER — CEFDINIR 300 MG PO CAPS
300.0000 mg | ORAL_CAPSULE | Freq: Two times a day (BID) | ORAL | 0 refills | Status: DC
Start: 2020-11-05 — End: 2020-12-18

## 2020-11-05 NOTE — Progress Notes (Signed)
Subjective:  Patient ID: Robin Bonilla, female    DOB: [birth year]  Age: 65 y.o. MRN: [id number]  Chief Complaint  Patient presents with  . Diabetes  . Dizziness    HPI Patient c/o dizziness and weakness. Had this some in January with Covid 19, but worsened since yesterday. States she is unsure if it is related to her FBS or not. She did take a meclizine this morning for her dizziness and it may have helped. Also diabetic medications this a.m. last FBS 239 at 07:30 a.m. Hydrating well.  Dizziness: spinning sensation. Feels like in a tunnel. Chills    Current Outpatient Medications on File Prior to Visit  Medication Sig Dispense Refill  . ACCU-CHEK GUIDE test strip TEST STRIPS TWICE A DAY IN VITRO 30 DAYS 100 each 2  . Accu-Chek Softclix Lancets lancets 3 (three) times daily.    . Blood Glucose Monitoring Suppl (ACCU-CHEK GUIDE) w/Device KIT daily. use as directed    . citalopram (CELEXA) 20 MG tablet TAKE 1 TABLET BY MOUTH EVERY DAY 90 tablet 1  . Dapagliflozin-metFORMIN HCl ER ([name]) 01-999 MG TB24 Take 1 tablet by mouth daily. STOP METFORMIN. 30 tablet 2  . fluticasone (FLONASE) 50 MCG/ACT nasal spray SPRAY 2 SPRAYS INTO EACH NOSTRIL EVERY DAY 48 mL 1  . glimepiride (AMARYL) 4 MG tablet Take 1 tablet by mouth 2 (two) times daily.   4  . icosapent Ethyl (VASCEPA) 1 g capsule Take 2 capsules (2 g total) by mouth 2 (two) times daily. 120 capsule 2  . JANUVIA 100 MG tablet Take 1 tablet (100 mg total) by mouth daily. 30 tablet 0  . levothyroxine (SYNTHROID) 150 MCG tablet TAKE 1 TABLET BY MOUTH DAILY BEFORE BREAKFAST. 30 tablet 0  . lisinopril (ZESTRIL) 10 MG tablet TAKE 1 TABLET BY MOUTH EVERY DAY 90 tablet 1  . loratadine (CLARITIN) 10 MG tablet Take 1 tablet (10 mg total) by mouth daily. 90 tablet 0  . meclizine (ANTIVERT) 25 MG tablet Take 1 tablet (25 mg total) by mouth 3 (three) times daily as needed for dizziness. 60 tablet 0  . meloxicam (MOBIC) 15 MG tablet TAKE 1  TABLET BY MOUTH EVERY DAY 60 tablet 1  . NOVOFINE 32G X 6 MM MISC USE AS DIRECTED ONCE A DAY SUBCUTANEOUS 30 DAYS    . rosuvastatin (CRESTOR) 20 MG tablet TAKE 1 TABLET BY MOUTH EVERY DAY 30 tablet 0  . traZODone (DESYREL) 50 MG tablet TAKE 1/2 TO 1 TABLET BY MOUTH AT BEDTIME AS NEEDED FOR SLEEP. 90 tablet 1   No current facility-administered medications on file prior to visit.   Past Medical History:  Diagnosis Date  . Acute sinusitis   . Depression   . Diabetes mellitus 2005  . Dysthymic disorder   . Hirsutism   . Hyperlipidemia   . Rash, skin   . Thyroid disease    Past Surgical History:  Procedure Laterality Date  . BACK SURGERY    . CHOLECYSTECTOMY  05/30/11  . PARTIAL HYSTERECTOMY    . TONSILLECTOMY    . TUBAL LIGATION      Family History  Problem Relation Age of Onset  . Breast cancer Mother 45  . CAD Father   . CAD Maternal Grandmother   . Hypertension Maternal Grandmother   . Diabetes Paternal Grandfather   . Diabetes Paternal Aunt    Social History   Socioeconomic History  . Marital status: Married    Spouse name: Not on  file  . Number of children: Not on file  . Years of education: Not on file  . Highest education level: Not on file  Occupational History  . Not on file  Tobacco Use  . Smoking status: Never Smoker  . Smokeless tobacco: Never Used  Substance and Sexual Activity  . Alcohol use: No    Alcohol/week: 0.0 standard drinks  . Drug use: No  . Sexual activity: Not on file  Other Topics Concern  . Not on file  Social History Narrative  . Not on file   Social Determinants of Health   Financial Resource Strain: Not on file  Food Insecurity: Not on file  Transportation Needs: Not on file  Physical Activity: Not on file  Stress: Not on file  Social Connections: Not on file    Review of Systems  Constitutional: Positive for chills and fatigue. Negative for fever.  HENT: Positive for congestion and sinus pain. Negative for ear pain,  rhinorrhea, sinus pressure and sore throat.   Respiratory: Positive for cough (persistent since had covid 19.). Negative for shortness of breath.   Cardiovascular: Negative for chest pain.  Gastrointestinal: Negative for diarrhea and nausea.  Endocrine: Negative for polydipsia and polyphagia.  Neurological: Positive for dizziness, weakness and headaches.     Objective:  BP 108/72   Pulse 84   Temp (!) 96.8 F (36 C)   Ht 5' (1.524 m)   Wt 171 lb (77.6 kg)   SpO2 99%   BMI 33.40 kg/m   BP/Weight 11/05/2020 07/06/2020 41/01/8308  Systolic BP 407 680 881  Diastolic BP 72 70 72  Wt. (Lbs) 171 182.6 188.4  BMI 33.4 35.66 36.79    Physical Exam Vitals reviewed.  Constitutional:      Appearance: Normal appearance. She is ill-appearing.  HENT:     Right Ear: Tympanic membrane normal.     Left Ear: Tympanic membrane normal.     Nose: Congestion present.     Comments: Frontal and maxillary sinus tenderness.     Mouth/Throat:     Pharynx: Oropharynx is clear. No oropharyngeal exudate or posterior oropharyngeal erythema.  Neck:     Vascular: No carotid bruit.  Cardiovascular:     Rate and Rhythm: Normal rate and regular rhythm.     Pulses: Normal pulses.     Heart sounds: Normal heart sounds. No murmur heard.   Pulmonary:     Effort: Pulmonary effort is normal. No respiratory distress.     Breath sounds: Normal breath sounds.  Abdominal:     General: Abdomen is flat.     Palpations: Abdomen is soft.     Tenderness: There is no abdominal tenderness.  Neurological:     Mental Status: She is alert and oriented to person, place, and time.  Psychiatric:        Mood and Affect: Mood normal.        Behavior: Behavior normal.   Has nystagmus (slight.)  Lab Results  Component Value Date   WBC 8.1 07/06/2020   HGB 15.0 07/06/2020   HCT 45.8 07/06/2020   PLT 239 07/06/2020   GLUCOSE 165 (H) 07/06/2020   CHOL 224 (H) 07/06/2020   TRIG 241 (H) 07/06/2020   HDL 36 (L)  07/06/2020   LDLCALC 144 (H) 07/06/2020   ALT 36 (H) 07/06/2020   AST 32 07/06/2020   NA 139 07/06/2020   K 4.4 07/06/2020   CL 98 07/06/2020   CREATININE 0.82 07/06/2020   BUN  14 07/06/2020   CO2 23 07/06/2020   TSH 4.570 (H) 07/06/2020   HGBA1C 9.3 (H) 07/06/2020   MICROALBUR 80 07/14/2020      Assessment & Plan:   1. Dizziness - CBC with Differential/Platelet - Comprehensive metabolic panel - TSH  2. Vertigo: could be a postviral labrinthyitis (postcovid) vs Sinusitis. Treat with meclizine, fluids, and antibiotic for sinusitis.   3. Mixed diabetic hyperlipidemia associated with type 2 diabetes mellitus (HCC) Poor control.  Await labs and consider changes to medicines.  - Hemoglobin A1c  4. Hypothyroidism (acquired) - T4, Free  5. Acute non-recurrent maxillary sinusitis  Start on cefdinir.  Meds ordered this encounter  Medications  . cefdinir (OMNICEF) 300 MG capsule    Sig: Take 1 capsule (300 mg total) by mouth 2 (two) times daily.    Dispense:  20 capsule    Refill:  0    Orders Placed This Encounter  Procedures  . CBC with Differential/Platelet  . Comprehensive metabolic panel  . Hemoglobin A1c  . TSH  . T4, Free    Put out of work for one week. May return sooner if symptoms improve or resolve \.   Follow-up: Return if symptoms worsen or fail to improve.  An After Visit Summary was printed and given to the patient.  [name], MD [facility] [phone]

## 2020-11-06 LAB — CBC WITH DIFFERENTIAL/PLATELET
Basophils Absolute: 0 10*3/uL (ref 0.0–0.2)
Basos: 1 %
EOS (ABSOLUTE): 0.3 10*3/uL (ref 0.0–0.4)
Eos: 4 %
Hematocrit: 45.5 % (ref 34.0–46.6)
Hemoglobin: 14.8 g/dL (ref 11.1–15.9)
Immature Grans (Abs): 0 10*3/uL (ref 0.0–0.1)
Immature Granulocytes: 0 %
Lymphocytes Absolute: 3.1 10*3/uL (ref 0.7–3.1)
Lymphs: 40 %
MCH: 26 pg — ABNORMAL LOW (ref 26.6–33.0)
MCHC: 32.5 g/dL (ref 31.5–35.7)
MCV: 80 fL (ref 79–97)
Monocytes Absolute: 0.4 10*3/uL (ref 0.1–0.9)
Monocytes: 6 %
Neutrophils Absolute: 3.8 10*3/uL (ref 1.4–7.0)
Neutrophils: 49 %
Platelets: 227 10*3/uL (ref 150–450)
RBC: 5.7 x10E6/uL — ABNORMAL HIGH (ref 3.77–5.28)
RDW: 14 % (ref 11.7–15.4)
WBC: 7.6 10*3/uL (ref 3.4–10.8)

## 2020-11-06 LAB — COMPREHENSIVE METABOLIC PANEL
ALT: 28 IU/L (ref 0–32)
AST: 27 IU/L (ref 0–40)
Albumin/Globulin Ratio: 1.4 (ref 1.2–2.2)
Albumin: 4.5 g/dL (ref 3.8–4.8)
Alkaline Phosphatase: 95 IU/L (ref 44–121)
BUN/Creatinine Ratio: 18 (ref 12–28)
BUN: 15 mg/dL (ref 8–27)
Bilirubin Total: 0.8 mg/dL (ref 0.0–1.2)
CO2: 22 mmol/L (ref 20–29)
Calcium: 10.4 mg/dL — ABNORMAL HIGH (ref 8.7–10.3)
Chloride: 99 mmol/L (ref 96–106)
Creatinine, Ser: 0.83 mg/dL (ref 0.57–1.00)
GFR calc Af Amer: 87 mL/min/{1.73_m2} (ref >59)
GFR calc non Af Amer: 76 mL/min/{1.73_m2} (ref >59)
Globulin, Total: 3.2 g/dL (ref 1.5–4.5)
Glucose: 249 mg/dL — ABNORMAL HIGH (ref 65–99)
Potassium: 4.4 mmol/L (ref 3.5–5.2)
Sodium: 140 mmol/L (ref 134–144)
Total Protein: 7.7 g/dL (ref 6.0–8.5)

## 2020-11-06 LAB — T4, FREE: Free T4: 1.78 ng/dL — ABNORMAL HIGH (ref 0.82–1.77)

## 2020-11-06 LAB — HEMOGLOBIN A1C
Est. average glucose Bld gHb Est-mCnc: 229 mg/dL
Hgb A1c MFr Bld: 9.6 % — ABNORMAL HIGH (ref 4.8–5.6)

## 2020-11-06 LAB — TSH: TSH: 0.051 u[IU]/mL — ABNORMAL LOW (ref 0.450–4.500)

## 2020-11-08 ENCOUNTER — Other Ambulatory Visit: Payer: Self-pay

## 2020-11-08 MED ORDER — LEVOTHYROXINE SODIUM 137 MCG PO TABS: 137.0000 ug | ORAL_TABLET | Freq: Every day | ORAL | 0 refills | Status: DC

## 2020-12-03 ENCOUNTER — Encounter: Payer: Self-pay | Admitting: Family Medicine

## 2020-12-08 ENCOUNTER — Other Ambulatory Visit: Payer: Self-pay | Admitting: Nurse Practitioner

## 2020-12-18 ENCOUNTER — Other Ambulatory Visit: Payer: Self-pay

## 2020-12-18 ENCOUNTER — Encounter: Payer: Self-pay | Admitting: Family Medicine

## 2020-12-18 ENCOUNTER — Ambulatory Visit: Payer: Managed Care, Other (non HMO) | Admitting: Family Medicine

## 2020-12-18 VITALS — BP 110/72 | HR 84 | Temp 97.3°F | Ht 60.0 in | Wt 163.0 lb

## 2020-12-18 DIAGNOSIS — S76111A Strain of right quadriceps muscle, fascia and tendon, initial encounter: Secondary | ICD-10-CM

## 2020-12-18 MED ORDER — TIZANIDINE HCL 2 MG PO TABS: 2.0000 mg | ORAL_TABLET | Freq: Three times a day (TID) | ORAL | 1 refills | Status: DC | PRN

## 2020-12-18 NOTE — Progress Notes (Addendum)
Subjective:  Patient ID: Robin Bonilla, female    DOB: [birth year]  Age: 65 y.o. MRN: [id number]  Chief Complaint  Patient presents with  . Leg Pain    HPI Right leg pain x 3 weeks throbbing pain that radiates from thigh down to foot. Walking worsens pain. Was seen at [facility] on 03/04 and was prescribed flexeril which helps some. Received a pain shot and a steroid shot. Did not help. Flexeril helps, but knocks her out. Patient denies any known injury. Denies joint pain (knee or hip.)   Current Outpatient Medications on File Prior to Visit  Medication Sig Dispense Refill  . ACCU-CHEK GUIDE test strip TEST STRIPS TWICE A DAY IN VITRO 30 DAYS 100 each 2  . Accu-Chek Softclix Lancets lancets 3 (three) times daily.    . Blood Glucose Monitoring Suppl (ACCU-CHEK GUIDE) w/Device KIT daily. use as directed    . citalopram (CELEXA) 20 MG tablet TAKE 1 TABLET BY MOUTH EVERY DAY 90 tablet 1  . Dapagliflozin-metFORMIN HCl ER ([name]) 01-999 MG TB24 Take 1 tablet by mouth daily. STOP METFORMIN. 30 tablet 2  . fluticasone (FLONASE) 50 MCG/ACT nasal spray SPRAY 2 SPRAYS INTO EACH NOSTRIL EVERY DAY 48 mL 1  . glimepiride (AMARYL) 4 MG tablet Take 1 tablet by mouth 2 (two) times daily.   4  . icosapent Ethyl (VASCEPA) 1 g capsule Take 2 capsules (2 g total) by mouth 2 (two) times daily. 120 capsule 2  . JANUVIA 100 MG tablet TAKE 1 TABLET BY MOUTH EVERY DAY 30 tablet 0  . levothyroxine (SYNTHROID) 137 MCG tablet Take 1 tablet (137 mcg total) by mouth daily before breakfast. 90 tablet 0  . lisinopril (ZESTRIL) 10 MG tablet TAKE 1 TABLET BY MOUTH EVERY DAY 90 tablet 1  . loratadine (CLARITIN) 10 MG tablet Take 1 tablet (10 mg total) by mouth daily. 90 tablet 0  . meclizine (ANTIVERT) 25 MG tablet Take 1 tablet (25 mg total) by mouth 3 (three) times daily as needed for dizziness. 60 tablet 0  . meloxicam (MOBIC) 15 MG tablet TAKE 1 TABLET BY MOUTH EVERY DAY 60 tablet 1  . NOVOFINE 32G X 6 MM MISC USE  AS DIRECTED ONCE A DAY SUBCUTANEOUS 30 DAYS    . rosuvastatin (CRESTOR) 20 MG tablet TAKE 1 TABLET BY MOUTH EVERY DAY 30 tablet 0  . traZODone (DESYREL) 50 MG tablet TAKE 1/2 TO 1 TABLET BY MOUTH AT BEDTIME AS NEEDED FOR SLEEP. 90 tablet 1   No current facility-administered medications on file prior to visit.   Past Medical History:  Diagnosis Date  . Acute sinusitis   . Depression   . Diabetes mellitus 2005  . Dysthymic disorder   . Hirsutism   . Hyperlipidemia   . Rash, skin   . Thyroid disease    Past Surgical History:  Procedure Laterality Date  . BACK SURGERY    . CHOLECYSTECTOMY  05/30/11  . PARTIAL HYSTERECTOMY    . TONSILLECTOMY    . TUBAL LIGATION      Family History  Problem Relation Age of Onset  . Breast cancer Mother 16  . CAD Father   . CAD Maternal Grandmother   . Hypertension Maternal Grandmother   . Diabetes Paternal Grandfather   . Diabetes Paternal Aunt    Social History   Socioeconomic History  . Marital status: Married    Spouse name: Not on file  . Number of children: Not on file  .  Years of education: Not on file  . Highest education level: Not on file  Occupational History  . Not on file  Tobacco Use  . Smoking status: Never Smoker  . Smokeless tobacco: Never Used  Substance and Sexual Activity  . Alcohol use: No    Alcohol/week: 0.0 standard drinks  . Drug use: No  . Sexual activity: Not on file  Other Topics Concern  . Not on file  Social History Narrative  . Not on file   Social Determinants of Health   Financial Resource Strain: Not on file  Food Insecurity: Not on file  Transportation Needs: Not on file  Physical Activity: Not on file  Stress: Not on file  Social Connections: Not on file    Review of Systems  Musculoskeletal: Positive for myalgias (Rt leg).     Objective:  BP 110/72   Pulse 84   Temp (!) 97.3 F (36.3 C)   Ht 5' (1.524 m)   Wt 163 lb (73.9 kg)   SpO2 99%   BMI 31.83 kg/m   BP/Weight  12/18/2020 11/05/2020 94/09/2902  Systolic BP 753 391 792  Diastolic BP 72 72 70  Wt. (Lbs) 163 171 182.6  BMI 31.83 33.4 35.66    Physical Exam Vitals reviewed.  Constitutional:      Appearance: Normal appearance.  Musculoskeletal:        General: Tenderness (rt lateral quadricep. muscle spasm. tender over rt lumbar region (mild)) present. No swelling, deformity or signs of injury. Normal range of motion.     Right lower leg: No edema.     Left lower leg: No edema.     Comments: Left leg is normal. Negative SLR BL.  Neurological:     Mental Status: She is alert.   rt leg is smaller than left leg.   Diabetic Foot Exam - Simple   No data filed      Lab Results  Component Value Date   WBC 7.6 11/05/2020   HGB 14.8 11/05/2020   HCT 45.5 11/05/2020   PLT 227 11/05/2020   GLUCOSE 249 (H) 11/05/2020   CHOL 224 (H) 07/06/2020   TRIG 241 (H) 07/06/2020   HDL 36 (L) 07/06/2020   LDLCALC 144 (H) 07/06/2020   ALT 28 11/05/2020   AST 27 11/05/2020   NA 140 11/05/2020   K 4.4 11/05/2020   CL 99 11/05/2020   CREATININE 0.83 11/05/2020   BUN 15 11/05/2020   CO2 22 11/05/2020   TSH 0.051 (L) 11/05/2020   HGBA1C 9.6 (H) 11/05/2020   MICROALBUR 80 07/14/2020      Assessment & Plan:   1. Strain of right quadriceps, initial encounter Muscle spasm. Change cyclobenzaprine to tizanidine.  Continue meloxicam.  Continue tylenol.  May use heat.  Out of work for 2 weeks.  Refer to [place] [facility].  Could be sciatica. If not improved with physical therapy, will either get lumbar xray or refer directly to orthopedics for further workup.   Meds ordered this encounter  Medications  . tiZANidine (ZANAFLEX) 2 MG tablet    Sig: Take 1 tablet (2 mg total) by mouth every 8 (eight) hours as needed for muscle spasms.    Dispense:  30 tablet    Refill:  1   Follow-up: Return in about 2 weeks (around 01/01/2021) for muscle spasm.  An After Visit Summary was printed and given to the  patient.  [name], MD [facility] [phone]

## 2020-12-18 NOTE — Patient Instructions (Addendum)
Change cyclobenzaprine to tizanidine 2 mg one three times a day as needed.  Try heat.  Scheduled physical therapy at [facility] at 4 pm today. Arrive by 3:45 pm.  [address]

## 2020-12-21 ENCOUNTER — Telehealth: Payer: Self-pay

## 2020-12-21 NOTE — Telephone Encounter (Signed)
Robin Bonilla came by the office and dropped off FMLA forms.  She currently is going to PT and they mentioned that her problems might be related to back issues.  She is willing to see a specialist in [place] if you feel that this is needed.

## 2020-12-24 ENCOUNTER — Other Ambulatory Visit: Payer: Self-pay | Admitting: Family Medicine

## 2020-12-24 ENCOUNTER — Other Ambulatory Visit: Payer: Self-pay

## 2020-12-24 ENCOUNTER — Ambulatory Visit
Admission: RE | Admit: 2020-12-24 | Discharge: 2020-12-24 | Disposition: A | Discharge status: Home / Self Care | Payer: Managed Care, Other (non HMO) | Source: Ambulatory Visit | Attending: Family Medicine | Admitting: Family Medicine

## 2020-12-24 DIAGNOSIS — S76111A Strain of right quadriceps muscle, fascia and tendon, initial encounter: Secondary | ICD-10-CM

## 2020-12-24 DIAGNOSIS — M5431 Sciatica, right side: Secondary | ICD-10-CM

## 2020-12-24 DIAGNOSIS — Z1231 Encounter for screening mammogram for malignant neoplasm of breast: Secondary | ICD-10-CM

## 2020-12-30 ENCOUNTER — Other Ambulatory Visit: Payer: Self-pay | Admitting: Nurse Practitioner

## 2020-12-30 DIAGNOSIS — U071 COVID-19: Secondary | ICD-10-CM

## 2020-12-30 DIAGNOSIS — J019 Acute sinusitis, unspecified: Secondary | ICD-10-CM

## 2020-12-31 ENCOUNTER — Other Ambulatory Visit: Payer: Self-pay

## 2020-12-31 ENCOUNTER — Ambulatory Visit (INDEPENDENT_AMBULATORY_CARE_PROVIDER_SITE_OTHER): Payer: Managed Care, Other (non HMO)

## 2020-12-31 ENCOUNTER — Telehealth: Payer: Self-pay

## 2020-12-31 ENCOUNTER — Ambulatory Visit (INDEPENDENT_AMBULATORY_CARE_PROVIDER_SITE_OTHER): Payer: Managed Care, Other (non HMO) | Admitting: Family Medicine

## 2020-12-31 DIAGNOSIS — M25551 Pain in right hip: Secondary | ICD-10-CM

## 2020-12-31 NOTE — Progress Notes (Signed)
Office Visit Note   Patient: Robin Bonilla           Date of Birth: 1956/02/04           MRN: [id number] Visit Date: 12/31/2020 Requested by: Blane Ohara, MD [address] [place],  [place] [address] PCP: Blane Ohara, MD  Subjective: Chief Complaint  Patient presents with   Right Thigh - Pain    Right lateral thigh pain, radiating into the knee, x several months. She thought she had just pulled a muscle, so she kept working - has to do a lot of lifting at work. The pain has continuously worsened. Brought a cd of right femur xray from [facility]. Has been to 2 sessions of therapy at [facility] -- they told her it was a back problem.    HPI: 65yo F presenting to clinic with concerns of several months of right lateral leg pain. Patient states that her pain is typically an ache, and she initially assumed that she pulled a muscle at work (Works in [employer], moving heavy boxes). She says that she tried to take it easy at work, with no change in her symptoms. Occasionally, she says her pain will progress to a 'burning' and a 'tingle.' Pain is not particularly worsened with laying on the affected side. She says that her leg feels weaker, but isn't sure if that's just due to pain. Several weeks ago, her pain grew 'just unbearable,' and she presented to an ED for evaluation. She was given a steroid shot and flexeril, and says this made no change in her symptoms. Her PCM then gave her tizanidine and sent her to physical therapy for quad strengthening, though patient states that physical therapist told her this is coming from her back. She says she has a history of Lumbar surgery, which she thinks was at the level of L1-L2 (as well as a cervical surgery). States her pain will occasionally be as high as her back, but for the most part of it focused in her thigh.               ROS:   All other systems were reviewed and are negative.  Objective: Vital Signs: There were no  vitals taken for this visit.  Physical Exam:  General:  Alert and oriented, in no acute distress. Pulm:  Breathing unlabored. Psy:  Normal mood, congruent affect. Skin:  Lower back with well healed surgical incision over upper aspect of lumbar spine. No bruises, no rashes.    MSK: Antalgic gait, favoring right leg.  Somewhat increased thoracic kyphosis, and flattened lumbar lordosis.   ROM: Full ROM in knee flexion/extension. Somewhat reduced hip internal rotation, but this is symmetrical with the left, and non-painful.   Palpation: Very mild tenderness to palpation along right mid-lumbar paraspinal muscles. Mild tenderness along right lateral thigh  (lateral quads and ITB, though she states this is not reproducing her pain). No tenderness within gluteal musculature. No midline spinal tenderness, no deformity or step-offs. No  tenderness over the SI Joints bilaterally.   Strength: Hip flexion (L1), Hip Aduction (L2), Knee Extension (L3) are 4/5 on the right, 5/5 on the left.  Foot Inversion (L4), Dorsiflexion (L5), and Eversion (S1) 5/5 Bilaterally Sensation: Intact to light touch medial and lateral aspects of lower extremities, and lateral, dorsal, and medial aspects of foot.  Reflexes: Patellar (L4), and Ankle (S1) 2+ Bilaterally Special Tests:  FABER and FADIR No SI Joint Pain  or deep groin pain. SLR: Endorses radiation down ipsilateral right leg.    Imaging: XR Lumbar Spine 2-3 Views  Result Date: 12/31/2020 Spine x-rays today show diffuse degenerative change and post-op changes at L1-2, L2-3 with absent spinous processes.   Assessment & Plan: 65yo F presenting to clinic with 'several months' of right lateral hip pain. At this time, unclear as to the exact source of her pain- though given her significant history of multiple spinal surgeries to the upper lumbar region, radiculopathy is certainly a high probability.  - Will attempt to Order an MRI to better evaluate her lumbar spine  to help guide therapy.  - Consider [facility] pending MRI results - Continue physical therapy while awaiting MRI  - Patient and her husband had no further questions or concerns today.      Procedures: No procedures performed        PMFS History: Patient Active Problem List   Diagnosis Date Noted   Close exposure to COVID-19 virus 05/21/2020   Vertigo 11/25/2019   Mixed conductive and sensorineural hearing loss of both ears 11/25/2019   Dyspnea and respiratory abnormality 12/03/2015   ILD (interstitial lung disease) (HCC) 12/03/2015   SOB (shortness of breath) 08/21/2015   DOE (dyspnea on exertion) 08/08/2015   Nausea 06/09/2011   Dermatitis, contact 06/09/2011   Cholelithiasis 05/08/2011   Pancreas cyst 05/08/2011   Past Medical History:  Diagnosis Date   Acute sinusitis    Depression    Diabetes mellitus 2005   Dysthymic disorder    Hirsutism    Hyperlipidemia    Rash, skin    Thyroid disease     Family History  Problem Relation Age of Onset   Breast cancer Mother 58   CAD Father    CAD Maternal Grandmother    Hypertension Maternal Grandmother    Diabetes Paternal Grandfather    Diabetes Paternal Aunt     Past Surgical History:  Procedure Laterality Date   BACK SURGERY     CHOLECYSTECTOMY  05/30/11   PARTIAL HYSTERECTOMY     TONSILLECTOMY     TUBAL LIGATION     Social History   Occupational History   Not on file  Tobacco Use   Smoking status: Never Smoker   Smokeless tobacco: Never Used  Substance and Sexual Activity   Alcohol use: No    Alcohol/week: 0.0 standard drinks   Drug use: No   Sexual activity: Not on file

## 2020-12-31 NOTE — Progress Notes (Signed)
I saw and examined the patient with Dr. Marga Hoots and agree with assessment and plan as outlined.    Right thigh pain for a few months, minimal LBP.  Tried PT but it wasn't helping.  Tried being OOW for a few weeks.  Prior spine surgery in the 80s, and another in the 90s per Dr. Channing Mutters.  Did very well after Dr. Temple Pacini surgery.  Recent x-rays of right thigh on CD today show mild-moderate hip DJD.  Still has good passive hip ROM, but with some pain.  Spine x-rays today show diffuse degenerative change and post-op changes at L1-2, L2-3 with absent spinous processes.  Will order lumbar MRI.  Consider resumption of PT or possibly [facility] depending on results.  If nothing helps, then a diagnostic hip injection.

## 2020-12-31 NOTE — Progress Notes (Signed)
Canceled.

## 2020-12-31 NOTE — Telephone Encounter (Signed)
LM asking pt to call the office back to RS the apt. Apt has been canceled due to the provider being out of the office. DOS 01/01/2021 

## 2021-01-01 ENCOUNTER — Ambulatory Visit (INDEPENDENT_AMBULATORY_CARE_PROVIDER_SITE_OTHER): Payer: Managed Care, Other (non HMO) | Admitting: Family Medicine

## 2021-01-01 DIAGNOSIS — M79604 Pain in right leg: Secondary | ICD-10-CM

## 2021-01-03 LAB — HM DIABETES EYE EXAM

## 2021-01-05 ENCOUNTER — Other Ambulatory Visit: Payer: Self-pay | Admitting: Nurse Practitioner

## 2021-01-13 ENCOUNTER — Other Ambulatory Visit: Payer: Self-pay

## 2021-01-13 ENCOUNTER — Ambulatory Visit
Admission: RE | Admit: 2021-01-13 | Discharge: 2021-01-13 | Disposition: A | Discharge status: Home / Self Care | Payer: Managed Care, Other (non HMO) | Source: Ambulatory Visit | Attending: Family Medicine | Admitting: Family Medicine

## 2021-01-13 DIAGNOSIS — M25551 Pain in right hip: Secondary | ICD-10-CM

## 2021-01-14 ENCOUNTER — Telehealth: Payer: Self-pay | Admitting: Family Medicine

## 2021-01-14 DIAGNOSIS — M25551 Pain in right hip: Secondary | ICD-10-CM

## 2021-01-14 NOTE — Telephone Encounter (Signed)
MRI shows disc bulges and bone spurring at multiple levels, but the worst appears to be on the right at L5-S1 where the right S1 nerve is probably being contacted.    Depending on patient preference, could either try physical therapy, or else refer her to Dr. Alvester Morin for injection.

## 2021-01-15 NOTE — Telephone Encounter (Signed)
I called and advised the patient. She will be calling me back with a fax number/name of where I need to fax the letter (holding the signed copy at my desk until then).

## 2021-01-15 NOTE — Telephone Encounter (Signed)
I called and advised the patient of the MRI results. She is choosing to try the PT -- requests [facility] in [place].  Her out-of-work note from her PCP, Dr. Sedalia Muta, will end this Friday (4/22). She is requesting a new note to keep her out of work, at least until she can get some PT in and is feeling better. Right now, the pain is not so bad in the morning, but worsens as she is up on her feet and doing her normal life activities (besides work).

## 2021-01-15 NOTE — Telephone Encounter (Signed)
Ordered.  Note printed.

## 2021-01-15 NOTE — Addendum Note (Signed)
Addended by: Lillia Carmel on: 01/15/2021 02:43 PM   Modules accepted: Orders

## 2021-01-17 ENCOUNTER — Ambulatory Visit: Payer: Managed Care, Other (non HMO) | Admitting: Family Medicine

## 2021-01-17 ENCOUNTER — Other Ambulatory Visit: Payer: Self-pay

## 2021-01-17 VITALS — BP 128/76 | HR 108 | Temp 97.5°F | Resp 18 | Ht 60.0 in | Wt 171.8 lb

## 2021-01-17 DIAGNOSIS — M5431 Sciatica, right side: Secondary | ICD-10-CM | POA: Diagnosis not present

## 2021-01-17 NOTE — Progress Notes (Signed)
Subjective:  Patient ID: Robin Bonilla, female    DOB: 1956/04/22  Age: 65 y.o. MRN: [id number]  Chief Complaint  Patient presents with  . Disablitity Paperwork    HPI Patient presents for follow up of lumbar radiculopathy sciatica rt. Pt is seeing orthopedics. She is unable to currently do her job due to pain. Currently, on Tizanidine 2 mg every 8 hours prn.  Patient is here to have short term disability papers filled out.  Current Outpatient Medications on File Prior to Visit  Medication Sig Dispense Refill  . ACCU-CHEK GUIDE test strip TEST STRIPS TWICE A DAY IN VITRO 30 DAYS 100 each 2  . Accu-Chek Softclix Lancets lancets 3 (three) times daily.    . Blood Glucose Monitoring Suppl (ACCU-CHEK GUIDE) w/Device KIT daily. use as directed    . citalopram (CELEXA) 20 MG tablet TAKE 1 TABLET BY MOUTH EVERY DAY 90 tablet 1  . JANUVIA 100 MG tablet TAKE 1 TABLET BY MOUTH EVERY DAY 30 tablet 0  . lisinopril (ZESTRIL) 10 MG tablet TAKE 1 TABLET BY MOUTH EVERY DAY 90 tablet 1  . meloxicam (MOBIC) 15 MG tablet TAKE 1 TABLET BY MOUTH EVERY DAY (Patient not taking: Reported on 12/31/2020) 60 tablet 1  . NOVOFINE 32G X 6 MM MISC USE AS DIRECTED ONCE A DAY SUBCUTANEOUS 30 DAYS     No current facility-administered medications on file prior to visit.   Past Medical History:  Diagnosis Date  . Acute sinusitis   . Depression   . Diabetes mellitus 2005  . Dysthymic disorder   . Hirsutism   . Hyperlipidemia   . Rash, skin   . Thyroid disease    Past Surgical History:  Procedure Laterality Date  . BACK SURGERY    . CHOLECYSTECTOMY  05/30/11  . PARTIAL HYSTERECTOMY    . TONSILLECTOMY    . TUBAL LIGATION      Family History  Problem Relation Age of Onset  . Breast cancer Mother 75  . CAD Father   . CAD Maternal Grandmother   . Hypertension Maternal Grandmother   . Diabetes Paternal Grandfather   . Diabetes Paternal Aunt    Social History   Socioeconomic History  . Marital  status: Married    Spouse name: Not on file  . Number of children: Not on file  . Years of education: Not on file  . Highest education level: Not on file  Occupational History  . Not on file  Tobacco Use  . Smoking status: Never Smoker  . Smokeless tobacco: Never Used  Substance and Sexual Activity  . Alcohol use: No    Alcohol/week: 0.0 standard drinks  . Drug use: No  . Sexual activity: Not on file  Other Topics Concern  . Not on file  Social History Narrative  . Not on file   Social Determinants of Health   Financial Resource Strain: Not on file  Food Insecurity: Not on file  Transportation Needs: Not on file  Physical Activity: Not on file  Stress: Not on file  Social Connections: Not on file    Review of Systems  Constitutional: Positive for fatigue.  Respiratory: Negative for cough and shortness of breath.   Cardiovascular: Negative for chest pain.     Objective:  BP 128/76   Pulse (!) 108   Temp (!) 97.5 F (36.4 C)   Resp 18   Ht 5' (1.524 m)   Wt 171 lb 12.8 oz (77.9 kg)  BMI 33.55 kg/m   BP/Weight 01/29/2021 01/17/2021 4/82/7078  Systolic BP 675 449 201  Diastolic BP 76 76 72  Wt. (Lbs) 171 171.8 163  BMI 33.4 33.55 31.83    Physical Exam Vitals reviewed.  Constitutional:      Appearance: Normal appearance.  Musculoskeletal:     Comments: Decreased size of rt leg. Atrophy. Tender lumbar rt paraspinal muscles. Positive SLR on rt. Left SLR normal.   Neurological:     Mental Status: She is alert.     Diabetic Foot Exam - Simple   No data filed      Lab Results  Component Value Date   WBC 9.6 01/29/2021   HGB 15.6 01/29/2021   HCT 48.3 (H) 01/29/2021   PLT 248 01/29/2021   GLUCOSE 184 (H) 01/29/2021   CHOL 244 (H) 01/29/2021   TRIG 291 (H) 01/29/2021   HDL 35 (L) 01/29/2021   LDLCALC 155 (H) 01/29/2021   ALT 32 01/29/2021   AST 26 01/29/2021   NA 142 01/29/2021   K 4.2 01/29/2021   CL 100 01/29/2021   CREATININE 0.87  01/29/2021   BUN 14 01/29/2021   CO2 23 01/29/2021   TSH 0.051 (L) 11/05/2020   HGBA1C 10.3 (H) 01/29/2021   MICROALBUR 80 07/14/2020      Assessment & Plan:   1. Right sided sciatica Restrictions given.  Put out of work.  Refer to disability papers.   Follow-up: No follow-ups on file.  An After Visit Summary was printed and given to the patient.  [name], MD [facility] [phone]

## 2021-01-18 NOTE — Telephone Encounter (Signed)
Pt called back with the fax number so we can send her out of work note.   Fax# [phone]

## 2021-01-18 NOTE — Telephone Encounter (Signed)
Faxed to Clydie Braun Price's attention [phone] per the patient's request.

## 2021-01-28 NOTE — Progress Notes (Addendum)
Subjective:  Patient ID: Robin Bonilla, female    DOB: Feb 25, 1956  Age: 65 y.o. MRN: [id number]  Chief Complaint  Patient presents with  . Diabetes  . Hyperlipidemia  . Hypertension    HPI Mixed diabetic hyperlipidemia associated with type 2 diabetes mellitus ([place]) Last checked 2 weeks ago and it was 145. Normally, checks at least once daily, but has stopped it.  Patient does check her feet every day.  She did just recently see the eye doctor and had a good report.  She has some numbness of her right foot however this is likely due to her sciatica.  She denies any numbness or tingling in other extremities.  Currently on [name] 5 to 1000 mg 1 tablet daily Januvia 100 mg once daily  Hypothyroidism (acquired) Is currently on Synthroid 50 mcg once daily.  Essential hypertension, benign Currently well controlled lisinopril 10 mg once daily  Hyperlipidemia: Currently taking rosuvastatin 20 mg once daily at night.  Right-sided sciatica is improving.  Patient is going to physical therapy.  She is also seeing orthopedics.   Current Outpatient Medications on File Prior to Visit  Medication Sig Dispense Refill  . ACCU-CHEK GUIDE test strip TEST STRIPS TWICE A DAY IN VITRO 30 DAYS 100 each 2  . Accu-Chek Softclix Lancets lancets 3 (three) times daily.    . Blood Glucose Monitoring Suppl (ACCU-CHEK GUIDE) w/Device KIT daily. use as directed    . citalopram (CELEXA) 20 MG tablet TAKE 1 TABLET BY MOUTH EVERY DAY 90 tablet 1  . JANUVIA 100 MG tablet TAKE 1 TABLET BY MOUTH EVERY DAY 30 tablet 0  . lisinopril (ZESTRIL) 10 MG tablet TAKE 1 TABLET BY MOUTH EVERY DAY 90 tablet 1  . meloxicam (MOBIC) 15 MG tablet TAKE 1 TABLET BY MOUTH EVERY DAY (Patient not taking: Reported on 12/31/2020) 60 tablet 1  . NOVOFINE 32G X 6 MM MISC USE AS DIRECTED ONCE A DAY SUBCUTANEOUS 30 DAYS     No current facility-administered medications on file prior to visit.   Past Medical History:  Diagnosis Date  .  Acute sinusitis   . Depression   . Diabetes mellitus 2005  . Dysthymic disorder   . Hirsutism   . Hyperlipidemia   . Rash, skin   . Thyroid disease    Past Surgical History:  Procedure Laterality Date  . BACK SURGERY    . CHOLECYSTECTOMY  05/30/11  . PARTIAL HYSTERECTOMY    . TONSILLECTOMY    . TUBAL LIGATION      Family History  Problem Relation Age of Onset  . Breast cancer Mother 76  . CAD Father   . CAD Maternal Grandmother   . Hypertension Maternal Grandmother   . Diabetes Paternal Grandfather   . Diabetes Paternal Aunt    Social History   Socioeconomic History  . Marital status: Married    Spouse name: Not on file  . Number of children: Not on file  . Years of education: Not on file  . Highest education level: Not on file  Occupational History  . Not on file  Tobacco Use  . Smoking status: Never Smoker  . Smokeless tobacco: Never Used  Substance and Sexual Activity  . Alcohol use: No    Alcohol/week: 0.0 standard drinks  . Drug use: No  . Sexual activity: Not on file  Other Topics Concern  . Not on file  Social History Narrative  . Not on file   Social Determinants of  Health   Financial Resource Strain: Not on file  Food Insecurity: Not on file  Transportation Needs: Not on file  Physical Activity: Not on file  Stress: Not on file  Social Connections: Not on file    Review of Systems  Constitutional: Negative for chills, fatigue and fever.  HENT: Negative for congestion, ear pain, rhinorrhea and sore throat.   Respiratory: Negative for cough and shortness of breath.   Cardiovascular: Negative for chest pain.  Gastrointestinal: Negative for abdominal pain, constipation, diarrhea, nausea and vomiting.  Endocrine: Negative for polydipsia, polyphagia and polyuria.  Genitourinary: Negative for dysuria and urgency.       Urge incontinence x about one year.   Musculoskeletal: Negative for back pain and myalgias.  Neurological: Negative for dizziness,  weakness, light-headedness, numbness and headaches.  Psychiatric/Behavioral: Negative for dysphoric mood. The patient is not nervous/anxious.      Objective:  BP 138/76   Pulse 85   Temp 97.6 F (36.4 C)   Resp 18   Ht 5' (1.524 m)   Wt 171 lb (77.6 kg)   SpO2 99%   BMI 33.40 kg/m   BP/Weight 01/29/2021 01/17/2021 0/06/2724  Systolic BP 366 440 347  Diastolic BP 76 76 72  Wt. (Lbs) 171 171.8 163  BMI 33.4 33.55 31.83    Physical Exam Vitals reviewed.  Constitutional:      Appearance: Normal appearance. She is normal weight.  Neck:     Vascular: No carotid bruit.  Cardiovascular:     Rate and Rhythm: Normal rate and regular rhythm.     Pulses: Normal pulses.     Heart sounds: Normal heart sounds.  Pulmonary:     Effort: Pulmonary effort is normal. No respiratory distress.     Breath sounds: Normal breath sounds.  Abdominal:     General: Abdomen is flat. Bowel sounds are normal.     Palpations: Abdomen is soft.     Tenderness: There is no abdominal tenderness.  Musculoskeletal:     Comments: Rt leg atrophy.  Neurological:     Mental Status: She is alert and oriented to person, place, and time.  Psychiatric:        Mood and Affect: Mood normal.        Behavior: Behavior normal.     Diabetic Foot Exam - Simple   Simple Foot Form Diabetic Foot exam was performed with the following findings: Yes 01/28/2021  9:35 PM  Visual Inspection No deformities, no ulcerations, no other skin breakdown bilaterally: Yes Sensation Testing See comments: Yes Pulse Check Posterior Tibialis and Dorsalis pulse intact bilaterally: Yes Comments Decreased sensation on rt foot.      Lab Results  Component Value Date   WBC 9.6 01/29/2021   HGB 15.6 01/29/2021   HCT 48.3 (H) 01/29/2021   PLT 248 01/29/2021   GLUCOSE 184 (H) 01/29/2021   CHOL 244 (H) 01/29/2021   TRIG 291 (H) 01/29/2021   HDL 35 (L) 01/29/2021   LDLCALC 155 (H) 01/29/2021   ALT 32 01/29/2021   AST 26 01/29/2021    NA 142 01/29/2021   K 4.2 01/29/2021   CL 100 01/29/2021   CREATININE 0.87 01/29/2021   BUN 14 01/29/2021   CO2 23 01/29/2021   TSH 0.051 (L) 11/05/2020   HGBA1C 10.3 (H) 01/29/2021   MICROALBUR 80 07/14/2020      Assessment & Plan:   1. Mixed diabetic hyperlipidemia associated with type 2 diabetes mellitus ([place]) Control: poor Recommend restart check  sugars fasting daily. Recommend check feet daily. Recommend annual eye exams. Medicines: recommend start tresiba 20 U daily Continue to work on eating a healthy diet and exercise.  Labs drawn today.   - CBC with Differential/Platelet - Hemoglobin A1c - Lipid panel  2. Hypothyroidism (acquired) The current medical regimen is effective;  continue present plan and medications.  3. Essential hypertension, benign Well controlled.  No changes to medicines.  Continue to work on eating a healthy diet and exercise.  Labs drawn today.  - Comprehensive metabolic panel  4. Right sided sciatica Continue with orthopedics and physical therapy.  5. Mixed hyperlipidemia Increase crestor 40 mg once daily at night. Recommend vascepa 1 gm 2 capsules twice a day.   6. Diabetic glomerulopathy ([place])  see above  Meds ordered this encounter  Medications  . tiZANidine (ZANAFLEX) 2 MG tablet    Sig: Take 1 tablet (2 mg total) by mouth every 8 (eight) hours as needed for muscle spasms.    Dispense:  30 tablet    Refill:  1    Orders Placed This Encounter  Procedures  . CBC with Differential/Platelet  . Hemoglobin A1c  . Lipid panel  . Comprehensive metabolic panel  . Cardiovascular Risk Assessment     Follow-up: Return in about 3 months (around 05/01/2021) for fasting.  An After Visit Summary was printed and given to the patient.  [name], MD [facility] [phone]

## 2021-01-29 ENCOUNTER — Ambulatory Visit: Payer: Managed Care, Other (non HMO) | Admitting: Family Medicine

## 2021-01-29 ENCOUNTER — Other Ambulatory Visit: Payer: Self-pay

## 2021-01-29 ENCOUNTER — Encounter: Payer: Self-pay | Admitting: Family Medicine

## 2021-01-29 VITALS — BP 138/76 | HR 85 | Temp 97.6°F | Resp 18 | Ht 60.0 in | Wt 171.0 lb

## 2021-01-29 DIAGNOSIS — I10 Essential (primary) hypertension: Secondary | ICD-10-CM | POA: Diagnosis not present

## 2021-01-29 DIAGNOSIS — E782 Mixed hyperlipidemia: Secondary | ICD-10-CM

## 2021-01-29 DIAGNOSIS — E039 Hypothyroidism, unspecified: Secondary | ICD-10-CM | POA: Diagnosis not present

## 2021-01-29 DIAGNOSIS — E1169 Type 2 diabetes mellitus with other specified complication: Secondary | ICD-10-CM

## 2021-01-29 DIAGNOSIS — M5431 Sciatica, right side: Secondary | ICD-10-CM

## 2021-01-29 DIAGNOSIS — E1121 Type 2 diabetes mellitus with diabetic nephropathy: Secondary | ICD-10-CM

## 2021-01-29 MED ORDER — TIZANIDINE HCL 2 MG PO TABS: 2.0000 mg | ORAL_TABLET | Freq: Three times a day (TID) | ORAL | 1 refills | Status: DC | PRN

## 2021-01-29 NOTE — Patient Instructions (Signed)
Diabetes: Strongly recommend checking sugars least once a day and keeping a sugar log.  Will await labs to make any further recommendations. Continue physical therapy and follow-up with orthopedics concerning sciatica. Recommend continue eating healthy and exercise as able.

## 2021-01-30 LAB — CBC WITH DIFFERENTIAL/PLATELET
Basophils Absolute: 0.1 10*3/uL (ref 0.0–0.2)
Basos: 1 %
EOS (ABSOLUTE): 0.5 10*3/uL — ABNORMAL HIGH (ref 0.0–0.4)
Eos: 6 %
Hematocrit: 48.3 % — ABNORMAL HIGH (ref 34.0–46.6)
Hemoglobin: 15.6 g/dL (ref 11.1–15.9)
Immature Grans (Abs): 0 10*3/uL (ref 0.0–0.1)
Immature Granulocytes: 0 %
Lymphocytes Absolute: 3.8 10*3/uL — ABNORMAL HIGH (ref 0.7–3.1)
Lymphs: 40 %
MCH: 25.5 pg — ABNORMAL LOW (ref 26.6–33.0)
MCHC: 32.3 g/dL (ref 31.5–35.7)
MCV: 79 fL (ref 79–97)
Monocytes Absolute: 0.6 10*3/uL (ref 0.1–0.9)
Monocytes: 7 %
Neutrophils Absolute: 4.5 10*3/uL (ref 1.4–7.0)
Neutrophils: 46 %
Platelets: 248 10*3/uL (ref 150–450)
RBC: 6.11 x10E6/uL — ABNORMAL HIGH (ref 3.77–5.28)
RDW: 14.9 % (ref 11.7–15.4)
WBC: 9.6 10*3/uL (ref 3.4–10.8)

## 2021-01-30 LAB — CARDIOVASCULAR RISK ASSESSMENT

## 2021-01-30 LAB — LIPID PANEL
Chol/HDL Ratio: 7 ratio — ABNORMAL HIGH (ref 0.0–4.4)
Cholesterol, Total: 244 mg/dL — ABNORMAL HIGH (ref 100–199)
HDL: 35 mg/dL — ABNORMAL LOW (ref >39)
LDL Chol Calc (NIH): 155 mg/dL — ABNORMAL HIGH (ref 0–99)
Triglycerides: 291 mg/dL — ABNORMAL HIGH (ref 0–149)
VLDL Cholesterol Cal: 54 mg/dL — ABNORMAL HIGH (ref 5–40)

## 2021-01-30 LAB — HEMOGLOBIN A1C
Est. average glucose Bld gHb Est-mCnc: 249 mg/dL
Hgb A1c MFr Bld: 10.3 % — ABNORMAL HIGH (ref 4.8–5.6)

## 2021-01-30 LAB — COMPREHENSIVE METABOLIC PANEL
ALT: 32 IU/L (ref 0–32)
AST: 26 IU/L (ref 0–40)
Albumin/Globulin Ratio: 1.2 (ref 1.2–2.2)
Albumin: 4.5 g/dL (ref 3.8–4.8)
Alkaline Phosphatase: 117 IU/L (ref 44–121)
BUN/Creatinine Ratio: 16 (ref 12–28)
BUN: 14 mg/dL (ref 8–27)
Bilirubin Total: 1.3 mg/dL — ABNORMAL HIGH (ref 0.0–1.2)
CO2: 23 mmol/L (ref 20–29)
Calcium: 10.3 mg/dL (ref 8.7–10.3)
Chloride: 100 mmol/L (ref 96–106)
Creatinine, Ser: 0.87 mg/dL (ref 0.57–1.00)
Globulin, Total: 3.7 g/dL (ref 1.5–4.5)
Glucose: 184 mg/dL — ABNORMAL HIGH (ref 65–99)
Potassium: 4.2 mmol/L (ref 3.5–5.2)
Sodium: 142 mmol/L (ref 134–144)
Total Protein: 8.2 g/dL (ref 6.0–8.5)
eGFR: 75 mL/min/{1.73_m2} (ref >59)

## 2021-02-03 ENCOUNTER — Other Ambulatory Visit: Payer: Self-pay | Admitting: Family Medicine

## 2021-02-04 ENCOUNTER — Telehealth: Payer: Self-pay | Admitting: Family Medicine

## 2021-02-04 ENCOUNTER — Other Ambulatory Visit: Payer: Self-pay

## 2021-02-04 MED ORDER — ROSUVASTATIN CALCIUM 40 MG PO TABS: 20.0000 mg | ORAL_TABLET | Freq: Every day | ORAL | 0 refills | Status: DC

## 2021-02-04 MED ORDER — ICOSAPENT ETHYL 1 G PO CAPS: 2.0000 g | ORAL_CAPSULE | Freq: Two times a day (BID) | ORAL | 0 refills | Status: DC

## 2021-02-04 NOTE — Telephone Encounter (Signed)
I called: the patient says she is feeling "much better." She had a PT session this morning and 1 more later this week. She would like a note that says she may return to work on 02/11/21. Would like this faxed to the same number as before.  Please advise if this is ok.

## 2021-02-04 NOTE — Telephone Encounter (Signed)
Pt need a note to go back to work. She would like a call back [phone]

## 2021-02-04 NOTE — Telephone Encounter (Signed)
Note written and faxed to the attention of Dortha Kern at [phone].

## 2021-02-05 DIAGNOSIS — F332 Major depressive disorder, recurrent severe without psychotic features: Secondary | ICD-10-CM | POA: Insufficient documentation

## 2021-02-05 DIAGNOSIS — E782 Mixed hyperlipidemia: Secondary | ICD-10-CM | POA: Insufficient documentation

## 2021-02-05 DIAGNOSIS — F32A Depression, unspecified: Secondary | ICD-10-CM | POA: Insufficient documentation

## 2021-02-05 DIAGNOSIS — E1165 Type 2 diabetes mellitus with hyperglycemia: Secondary | ICD-10-CM | POA: Insufficient documentation

## 2021-02-05 DIAGNOSIS — E039 Hypothyroidism, unspecified: Secondary | ICD-10-CM | POA: Insufficient documentation

## 2021-02-05 DIAGNOSIS — E7800 Pure hypercholesterolemia, unspecified: Secondary | ICD-10-CM | POA: Insufficient documentation

## 2021-02-09 ENCOUNTER — Encounter: Payer: Self-pay | Admitting: Family Medicine

## 2021-02-09 MED ORDER — TRESIBA FLEXTOUCH 200 UNIT/ML ~~LOC~~ SOPN: 20.0000 [IU] | PEN_INJECTOR | Freq: Every day | SUBCUTANEOUS | 2 refills | Status: DC

## 2021-03-28 ENCOUNTER — Other Ambulatory Visit: Payer: Self-pay | Admitting: Nurse Practitioner

## 2021-05-03 ENCOUNTER — Other Ambulatory Visit: Payer: Self-pay | Admitting: Family Medicine

## 2021-05-05 ENCOUNTER — Other Ambulatory Visit: Payer: Self-pay | Admitting: Family Medicine

## 2021-05-08 ENCOUNTER — Other Ambulatory Visit: Payer: Self-pay

## 2021-05-08 MED ORDER — LEVOTHYROXINE SODIUM 137 MCG PO TABS: 137.0000 ug | ORAL_TABLET | Freq: Every day | ORAL | 0 refills | Status: DC

## 2021-05-08 NOTE — Telephone Encounter (Signed)
Refill sent to pharmacy.   

## 2021-06-05 ENCOUNTER — Other Ambulatory Visit: Payer: Self-pay | Admitting: Physician Assistant

## 2021-07-11 ENCOUNTER — Other Ambulatory Visit: Payer: Self-pay | Admitting: Family Medicine

## 2021-07-11 ENCOUNTER — Telehealth: Payer: Self-pay

## 2021-07-11 MED ORDER — MECLIZINE HCL 25 MG PO TABS: 25.0000 mg | ORAL_TABLET | Freq: Three times a day (TID) | ORAL | 0 refills | Status: DC | PRN

## 2021-07-11 NOTE — Telephone Encounter (Signed)
Mr. Boddy called to report that his wife is having an episode of vertigo.  They were out doing errands and her vertigo started suddenly.  She is having no weakness but some nausea.  She has taken medication in the past (? Antivert). He request that something be called to the pharmacy.

## 2021-07-11 NOTE — Telephone Encounter (Signed)
Patient was informed that Dr Sedalia Muta sent meclizine.

## 2021-07-25 ENCOUNTER — Other Ambulatory Visit: Payer: Self-pay

## 2021-07-25 MED ORDER — LEVOTHYROXINE SODIUM 137 MCG PO TABS: 137.0000 ug | ORAL_TABLET | Freq: Every day | ORAL | 0 refills | Status: DC

## 2021-07-25 NOTE — Telephone Encounter (Signed)
Refill sent to pharmacy.   

## 2021-08-04 ENCOUNTER — Other Ambulatory Visit: Payer: Self-pay | Admitting: Family Medicine

## 2021-08-07 ENCOUNTER — Other Ambulatory Visit: Payer: Self-pay | Admitting: Nurse Practitioner

## 2021-08-11 ENCOUNTER — Encounter (HOSPITAL_BASED_OUTPATIENT_CLINIC_OR_DEPARTMENT_OTHER): Payer: Self-pay | Admitting: Emergency Medicine

## 2021-08-11 ENCOUNTER — Emergency Department (HOSPITAL_BASED_OUTPATIENT_CLINIC_OR_DEPARTMENT_OTHER)
Admission: EM | Admit: 2021-08-11 | Discharge: 2021-08-12 | Disposition: A | Discharge status: Home / Self Care | Payer: Managed Care, Other (non HMO) | Attending: Emergency Medicine | Admitting: Emergency Medicine

## 2021-08-11 ENCOUNTER — Emergency Department (HOSPITAL_BASED_OUTPATIENT_CLINIC_OR_DEPARTMENT_OTHER): Payer: Managed Care, Other (non HMO)

## 2021-08-11 ENCOUNTER — Other Ambulatory Visit: Payer: Self-pay

## 2021-08-11 DIAGNOSIS — K8020 Calculus of gallbladder without cholecystitis without obstruction: Secondary | ICD-10-CM | POA: Insufficient documentation

## 2021-08-11 DIAGNOSIS — E785 Hyperlipidemia, unspecified: Secondary | ICD-10-CM | POA: Insufficient documentation

## 2021-08-11 DIAGNOSIS — E039 Hypothyroidism, unspecified: Secondary | ICD-10-CM | POA: Diagnosis not present

## 2021-08-11 DIAGNOSIS — Z79899 Other long term (current) drug therapy: Secondary | ICD-10-CM | POA: Diagnosis not present

## 2021-08-11 DIAGNOSIS — Z794 Long term (current) use of insulin: Secondary | ICD-10-CM | POA: Insufficient documentation

## 2021-08-11 DIAGNOSIS — E1169 Type 2 diabetes mellitus with other specified complication: Secondary | ICD-10-CM | POA: Insufficient documentation

## 2021-08-11 DIAGNOSIS — N939 Abnormal uterine and vaginal bleeding, unspecified: Secondary | ICD-10-CM | POA: Diagnosis present

## 2021-08-11 DIAGNOSIS — I10 Essential (primary) hypertension: Secondary | ICD-10-CM | POA: Diagnosis not present

## 2021-08-11 LAB — URINALYSIS, ROUTINE W REFLEX MICROSCOPIC
Bilirubin Urine: NEGATIVE
Glucose, UA: >=500 mg/dL — AB
Ketones, ur: NEGATIVE mg/dL
Nitrite: NEGATIVE
Protein, ur: NEGATIVE mg/dL
Specific Gravity, Urine: 1.02 (ref 1.005–1.030)
pH: 5.5 (ref 5.0–8.0)

## 2021-08-11 LAB — CBC WITH DIFFERENTIAL/PLATELET
Abs Immature Granulocytes: 0.03 10*3/uL (ref 0.00–0.07)
Basophils Absolute: 0.1 10*3/uL (ref 0.0–0.1)
Basophils Relative: 1 %
Eosinophils Absolute: 0.4 10*3/uL (ref 0.0–0.5)
Eosinophils Relative: 5 %
HCT: 44 % (ref 36.0–46.0)
Hemoglobin: 14.2 g/dL (ref 12.0–15.0)
Immature Granulocytes: 0 %
Lymphocytes Relative: 40 %
Lymphs Abs: 2.8 10*3/uL (ref 0.7–4.0)
MCH: 26.3 pg (ref 26.0–34.0)
MCHC: 32.3 g/dL (ref 30.0–36.0)
MCV: 81.6 fL (ref 80.0–100.0)
Monocytes Absolute: 0.4 10*3/uL (ref 0.1–1.0)
Monocytes Relative: 5 %
Neutro Abs: 3.5 10*3/uL (ref 1.7–7.7)
Neutrophils Relative %: 49 %
Platelets: 126 10*3/uL — ABNORMAL LOW (ref 150–400)
RBC: 5.39 MIL/uL — ABNORMAL HIGH (ref 3.87–5.11)
RDW: 14.2 % (ref 11.5–15.5)
WBC: 7.1 10*3/uL (ref 4.0–10.5)
nRBC: 0 % (ref 0.0–0.2)

## 2021-08-11 LAB — URINALYSIS, MICROSCOPIC (REFLEX): RBC / HPF: >50 RBC/hpf (ref 0–5)

## 2021-08-11 LAB — BASIC METABOLIC PANEL
Anion gap: 9 (ref 5–15)
BUN: 14 mg/dL (ref 8–23)
CO2: 23 mmol/L (ref 22–32)
Calcium: 9.4 mg/dL (ref 8.9–10.3)
Chloride: 104 mmol/L (ref 98–111)
Creatinine, Ser: 0.76 mg/dL (ref 0.44–1.00)
GFR, Estimated: >60 mL/min (ref >60)
Glucose, Bld: 222 mg/dL — ABNORMAL HIGH (ref 70–99)
Potassium: 5.4 mmol/L — ABNORMAL HIGH (ref 3.5–5.1)
Sodium: 136 mmol/L (ref 135–145)

## 2021-08-11 LAB — WET PREP, GENITAL
Clue Cells Wet Prep HPF POC: NONE SEEN
Sperm: NONE SEEN
Trich, Wet Prep: NONE SEEN
Yeast Wet Prep HPF POC: NONE SEEN

## 2021-08-11 MED ORDER — IOHEXOL 300 MG/ML  SOLN
100.0000 mL | Freq: Once | INTRAMUSCULAR | Status: AC | PRN
  Administered 2021-08-12: 100 mL via INTRAVENOUS

## 2021-08-11 NOTE — ED Provider Notes (Signed)
[facility] EMERGENCY DEPARTMENT Provider Note   CSN: [id number] Arrival date & time: 08/11/21  2025     History Chief Complaint  Patient presents with   Vaginal Bleeding    Robin Bonilla is a 65 y.o. female.   Vaginal Bleeding Associated symptoms: dysuria and vaginal discharge   Associated symptoms: no abdominal pain, no back pain, no fever and no nausea    Patient presents due to concerns about abnormal vaginal discharge.  Vague history, patient is a challenging historian.  Sounds like she was treated for a yeast infection almost a month ago, she still been having itching and scant vaginal discharge.  Reports either today or yesterday she started having bright red blood in the vagina.  Denies any from the rectum.  Not having any abdominal pain, nausea, vomiting.  Reports she sees her OBG regularly, last saw him a few months ago.  No history of cervical cancer or uterine cancer. Endorses some mild dysuria.   Patient adamantly denies any rectal bleeding. Suspects bleeding is worsened by scratching at the vaginal canal secondary to yeast.   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     Family History  Problem Relation Age of Onset   Breast cancer Mother 10   CAD Father    CAD Maternal Grandmother    Hypertension Maternal Grandmother    Diabetes Paternal Grandfather    Diabetes Paternal Aunt     Social History   Tobacco Use   Smoking status: Never   Smokeless tobacco: Never  Substance Use Topics   Alcohol use: No    Alcohol/week: 0.0 standard drinks   Drug use: No    Home Medications Prior to Admission medications   Medication Sig Start Date End Date Taking? Authorizing Provider  ACCU-CHEK GUIDE test strip TEST STRIPS TWICE A DAY IN VITRO 30 DAYS 08/09/20   Rip Harbour, NP  Accu-Chek Softclix Lancets lancets 3 (three) times daily. 01/09/20   [provider]  Blood Glucose Monitoring Suppl (ACCU-CHEK GUIDE) w/Device KIT daily. use as directed 12/07/19   [provider]  citalopram (CELEXA) 20 MG tablet TAKE 1 TABLET BY MOUTH EVERY DAY 07/30/20   Cox, Kirsten, MD  icosapent Ethyl (VASCEPA) 1 g capsule Take 2 capsules (2 g total) by mouth 2 (two) times daily. 02/04/21   [name], MD  insulin degludec (TRESIBA FLEXTOUCH) 200 UNIT/ML FlexTouch Pen Inject 20 Units into the skin daily. 02/09/21   Cox, Elnita Maxwell, MD  JANUVIA 100 MG tablet TAKE 1 TABLET BY MOUTH EVERY DAY 06/05/21   Cox, Elnita Maxwell, MD  levothyroxine (SYNTHROID) 137 MCG tablet Take 1 tablet (137 mcg total) by mouth daily before breakfast. 07/25/21   Cox, Kirsten, MD  lisinopril (ZESTRIL) 10 MG tablet TAKE 1 TABLET BY MOUTH EVERY DAY 10/24/20   Cox, Elnita Maxwell, MD  meclizine (ANTIVERT) 25 MG tablet Take 1 tablet (25 mg total) by mouth 3 (three) times daily as needed for dizziness. 07/11/21   Cox, Elnita Maxwell, MD  meloxicam (MOBIC) 15 MG tablet TAKE 1 TABLET BY MOUTH EVERY  DAY Patient not taking: Reported on 12/31/2020 10/30/20   Rip Harbour, NP  NOVOFINE 32G X 6 MM MISC USE AS DIRECTED ONCE A DAY SUBCUTANEOUS 30 DAYS 09/06/19   [provider]  rosuvastatin (CRESTOR) 40 MG tablet Take 0.5 tablets (20 mg total) by mouth daily. 02/04/21   Cox, Elnita Maxwell, MD  tiZANidine (ZANAFLEX) 2 MG tablet Take 1 tablet (2 mg total) by mouth every 8 (eight) hours as needed for muscle spasms. 01/29/21   Cox, Kirsten, MD  [name] 01-999 MG TB24 TAKE 1 TABLET BY MOUTH DAILY. STOP METFORMIN. 05/06/21   Rip Harbour, NP    Allergies    Penicillins and Simvastatin  Review of Systems   Review of Systems  Constitutional:  Negative for chills and fever.  HENT:  Negative for ear pain and sore throat.   Eyes:  Negative for pain and visual disturbance.  Respiratory:  Negative for cough and shortness of breath.   Cardiovascular:  Negative for chest pain and palpitations.  Gastrointestinal:  Negative for abdominal pain, nausea and vomiting.  Genitourinary:  Positive for dysuria, vaginal bleeding and vaginal discharge. Negative for vaginal pain.  Musculoskeletal:  Negative for arthralgias and back pain.  Skin:  Negative for color change and rash.  Neurological:  Negative for seizures and syncope.  All other systems reviewed and are negative.  Physical Exam Updated Vital Signs BP 137/75 (BP Location: Right Arm)   Pulse 91   Temp 98.1 F (36.7 C) (Oral)   Resp 18   Ht 5' (1.524 m)   Wt 77.1 kg   SpO2 98%   BMI 33.20 kg/m   Physical Exam Vitals and nursing note reviewed. Exam conducted with a chaperone present.  Constitutional:      Appearance: Normal appearance.  HENT:     Head: Normocephalic and atraumatic.  Eyes:     General: No scleral icterus.       Right eye: No discharge.        Left eye: No discharge.     Extraocular Movements: Extraocular movements intact.     Pupils: Pupils are equal, round, and reactive to light.  Cardiovascular:     Rate and  Rhythm: Normal rate and regular rhythm.     Pulses: Normal pulses.     Heart sounds: Normal heart sounds. No murmur heard.   No friction rub. No gallop.  Pulmonary:     Effort: Pulmonary effort is normal. No respiratory distress.     Breath sounds: Normal breath sounds.  Abdominal:     General: Abdomen is flat. Bowel sounds are normal. There is no distension.     Palpations: Abdomen is soft.     Tenderness: There is no abdominal tenderness.     Comments: Soft, not particularly tender  Genitourinary:    Comments: Pelvic performed, there is some dried blood surrounding the cervix but no active bleeding.  No active bleeding surrounding the urethra.  No obvious discharge. Skin:    General: Skin is warm and dry.     Coloration: Skin is not jaundiced.  Neurological:     Mental Status: She is alert. Mental status is at baseline.     Coordination: Coordination normal.   ED Results / Procedures / Treatments   Labs (all labs ordered are listed, but only abnormal results are displayed) Labs Reviewed  WET PREP, GENITAL  URINALYSIS, ROUTINE W REFLEX MICROSCOPIC  CBC WITH DIFFERENTIAL/PLATELET  BASIC METABOLIC PANEL  GC/CHLAMYDIA PROBE AMP ([place]) NOT AT [facility]    EKG None  Radiology No results found.  Procedures Procedures   Medications Ordered in ED Medications - No data to display  ED Course  I have reviewed the triage vital signs and the nursing notes.  Pertinent labs & imaging results that were available during my care of the patient were reviewed by me and considered in my medical decision making (see chart for details).    MDM Rules/Calculators/A&P                           Vitals are stable, patient is nontoxic-appearing.  No hypotension or tachycardia concerning for acute blood loss.  Physical exam does show scant red blood crusted along the cervix but no active bleeding.  No abdominal tenderness concerning for peritonitis. Patient declined rectal exam but don't  suspect rectal bleeding.  CT abdomen pelvis was ordered to assess for bleeding because ultrasound is unavailable at this time.  Have asked my attending Dr. Darl Householder.  Labs: CBC: There is a slight thrombocytopenia which is new compared to 6 months ago, not sure if this is contributing to the blood loss.  There is no leukocytosis concerning for infectious process. UA is turbid, moderate amount of hemoglobin without findings consistent with a UTI.  There is some slight leukocytosis but no nitrites. CMP without any gross electrolyte derangement, patient is mildly hyperkalemic at 5.4.  CT Abdomen/Pelvis: Pending at shift change  Discussed the results of the work-up thus far with Dr. Stark Jock who is taking over care.  Expect patient will likely be discharged with follow-up with OB/GYN.  I do not suspect any emergent pathology, her vitals are stable and she is not anemic.  She does not appear to be actively bleeding, no frank blood in the vaginal vault other than the dried blood surrounding the cervix.    Final Clinical Impression(s) / ED Diagnoses Final diagnoses:  None    Rx / DC Orders ED Discharge Orders     None        Sherrill Raring, PA-C 08/11/21 2348    Veryl Speak, MD 08/12/21 0110

## 2021-08-11 NOTE — ED Triage Notes (Signed)
Pt reports she was treated for yeast infection about 1 mo ago; she has continued itching on and off; she also reports "leakage" but cannot confirm if this is vaginal discharge; sts she is bleeding tonight, but cannot confirm if it is vaginal or elsewhere

## 2021-08-11 NOTE — Discharge Instructions (Addendum)
Follow-up with your [facility] gynecologist this week.

## 2021-08-12 NOTE — ED Notes (Signed)
Patient transported to CT 

## 2021-09-03 ENCOUNTER — Ambulatory Visit: Payer: Managed Care, Other (non HMO) | Admitting: Family Medicine

## 2021-09-03 ENCOUNTER — Other Ambulatory Visit: Payer: Self-pay

## 2021-09-03 ENCOUNTER — Encounter: Payer: Self-pay | Admitting: Family Medicine

## 2021-09-03 VITALS — BP 118/72 | HR 83 | Temp 97.3°F | Ht 60.0 in | Wt 173.0 lb

## 2021-09-03 DIAGNOSIS — E1159 Type 2 diabetes mellitus with other circulatory complications: Secondary | ICD-10-CM

## 2021-09-03 DIAGNOSIS — N939 Abnormal uterine and vaginal bleeding, unspecified: Secondary | ICD-10-CM | POA: Diagnosis not present

## 2021-09-03 DIAGNOSIS — F5104 Psychophysiologic insomnia: Secondary | ICD-10-CM

## 2021-09-03 DIAGNOSIS — I10 Essential (primary) hypertension: Secondary | ICD-10-CM

## 2021-09-03 DIAGNOSIS — E1169 Type 2 diabetes mellitus with other specified complication: Secondary | ICD-10-CM

## 2021-09-03 DIAGNOSIS — D696 Thrombocytopenia, unspecified: Secondary | ICD-10-CM

## 2021-09-03 DIAGNOSIS — I152 Hypertension secondary to endocrine disorders: Secondary | ICD-10-CM

## 2021-09-03 DIAGNOSIS — E039 Hypothyroidism, unspecified: Secondary | ICD-10-CM

## 2021-09-03 DIAGNOSIS — E782 Mixed hyperlipidemia: Secondary | ICD-10-CM

## 2021-09-03 MED ORDER — ESZOPICLONE 2 MG PO TABS: 2.0000 mg | ORAL_TABLET | Freq: Every evening | ORAL | 2 refills | Status: DC | PRN

## 2021-09-03 NOTE — Progress Notes (Signed)
Subjective:  Patient ID: Robin Bonilla, female    DOB: Mar 11, 1956  Age: 65 y.o. MRN: [id number]  Chief Complaint  Patient presents with   Hospitalization Follow-up    Vaginal bleeding    Follow up Hospitalization Patient was admitted to [facility] ER on 08/11/2021 and discharged on 08/12/2021. Pt went for vaginal bleeding (pt had TAH yrs ago.) She was treated for Vaginal itch/bleeding. CBC and Ct of abdomen and pelvis showed no abnormality. Only recommendation was to follow up with OBGYN and primary care physician. She has had no further sxs since returning home. She has not seen her gynecologist due to having to take of her mother. Her mother had a fall and had a Subdural hematoma. She was sent back to [facility]. Her mother is not eating. Her mother has dementia, but has worsened since fall (08/15/2021.) Her mother is being seen by Robin Celeste, NP today and they are trying to get her moved to a SNF. Unless Robin Bonilla is with her, her mother is not eating. Robin Bonilla is having to be there to help her to eat. She is requesting FMLA. Unfortunately, the patient is her mother, so Robin Celeste NP has to fill out these forms.   Robin Bonilla is a  65 yo female with pmhx of diabetes, hyperlipidemia, hypothyroidism, and hypertension. She is not fasting today. She is significantly overdue for her chronic follow up. She has not been seen since May 2022. She has to rush back out to the assisted living to give the NP her FMLA papers right now. She denies any medical complaints herself.  Current Outpatient Medications on File Prior to Visit  Medication Sig Dispense Refill   ACCU-CHEK GUIDE test strip TEST STRIPS TWICE A DAY IN VITRO 30 DAYS 100 each 2   Accu-Chek Softclix Lancets lancets 3 (three) times daily.     Blood Glucose Monitoring Suppl (ACCU-CHEK GUIDE) w/Device KIT daily. use as directed     citalopram (CELEXA) 20 MG tablet TAKE 1 TABLET BY MOUTH EVERY DAY 90 tablet 1   icosapent Ethyl (VASCEPA) 1 g  capsule Take 2 capsules (2 g total) by mouth 2 (two) times daily. 360 capsule 0   insulin degludec (TRESIBA FLEXTOUCH) 200 UNIT/ML FlexTouch Pen Inject 20 Units into the skin daily. 6 mL 2   levothyroxine (SYNTHROID) 137 MCG tablet Take 1 tablet (137 mcg total) by mouth daily before breakfast. 90 tablet 0   lisinopril (ZESTRIL) 10 MG tablet TAKE 1 TABLET BY MOUTH EVERY DAY 90 tablet 1   meclizine (ANTIVERT) 25 MG tablet Take 1 tablet (25 mg total) by mouth 3 (three) times daily as needed for dizziness. 30 tablet 0   meloxicam (MOBIC) 15 MG tablet TAKE 1 TABLET BY MOUTH EVERY DAY 60 tablet 1   NOVOFINE 32G X 6 MM MISC USE AS DIRECTED ONCE A DAY SUBCUTANEOUS 30 DAYS     nystatin-triamcinolone (MYCOLOG II) cream APPLY TO AFFECTED AREA TWICE A DAY IN THE MORNING AND IN THE EVENING     rosuvastatin (CRESTOR) 40 MG tablet Take 0.5 tablets (20 mg total) by mouth daily. 90 tablet 0   tiZANidine (ZANAFLEX) 2 MG tablet Take 1 tablet (2 mg total) by mouth every 8 (eight) hours as needed for muscle spasms. 30 tablet 1   [name] 01-999 MG TB24 TAKE 1 TABLET BY MOUTH DAILY. STOP METFORMIN. 30 tablet 2   No current facility-administered medications on file prior to visit.   Past Medical History:  Diagnosis Date  Acute sinusitis    Depression    Diabetes mellitus 2005   Dysthymic disorder    Hirsutism    Hyperlipidemia    Rash, skin    Thyroid disease    Past Surgical History:  Procedure Laterality Date   BACK SURGERY     CHOLECYSTECTOMY  05/30/11   PARTIAL HYSTERECTOMY     TONSILLECTOMY     TUBAL LIGATION      Family History  Problem Relation Age of Onset   Breast cancer Mother 85   CAD Father    CAD Maternal Grandmother    Hypertension Maternal Grandmother    Diabetes Paternal Grandfather    Diabetes Paternal Aunt    Social History   Socioeconomic History   Marital status: Married    Spouse name: Not on file   Number of children: Not on file   Years of education: Not on file    Highest education level: Not on file  Occupational History   Not on file  Tobacco Use   Smoking status: Never   Smokeless tobacco: Never  Substance and Sexual Activity   Alcohol use: No    Alcohol/week: 0.0 standard drinks   Drug use: No   Sexual activity: Not on file  Other Topics Concern   Not on file  Social History Narrative   Not on file   Social Determinants of Health   Financial Resource Strain: Not on file  Food Insecurity: Not on file  Transportation Needs: Not on file  Physical Activity: Not on file  Stress: Not on file  Social Connections: Not on file    Review of Systems  Constitutional:  Positive for fatigue. Negative for appetite change and fever.  HENT:  Negative for congestion, ear pain, sinus pressure and sore throat.   Eyes:  Negative for pain.  Respiratory:  Negative for cough, chest tightness, shortness of breath and wheezing.   Cardiovascular:  Negative for chest pain and palpitations.  Gastrointestinal:  Negative for abdominal pain, constipation, diarrhea, nausea and vomiting.  Genitourinary:  Negative for dysuria, hematuria and vaginal discharge.  Musculoskeletal:  Negative for arthralgias, back pain, joint swelling and myalgias.  Skin:  Negative for rash.  Neurological:  Negative for dizziness, weakness and headaches.  Psychiatric/Behavioral:  Positive for sleep disturbance. Negative for dysphoric mood. The patient is not nervous/anxious.     Objective:  BP 118/72 (BP Location: Left Arm, Patient Position: Sitting)   Pulse 83   Temp (!) 97.3 F (36.3 C) (Temporal)   Ht 5' (1.524 m)   Wt 173 lb (78.5 kg)   SpO2 98%   BMI 33.79 kg/m   BP/Weight 09/03/2021 47/82/9562 10/02/863  Systolic BP 784 696 295  Diastolic BP 72 69 76  Wt. (Lbs) 173 170 171  BMI 33.79 33.2 33.4    Physical Exam Vitals reviewed.  Constitutional:      Appearance: Normal appearance. She is normal weight.  Neck:     Vascular: No carotid bruit.  Cardiovascular:      Rate and Rhythm: Normal rate and regular rhythm.     Pulses: Normal pulses.     Heart sounds: Normal heart sounds.  Pulmonary:     Effort: Pulmonary effort is normal. No respiratory distress.     Breath sounds: Normal breath sounds.  Abdominal:     General: Abdomen is flat. Bowel sounds are normal.     Palpations: Abdomen is soft.     Tenderness: There is no abdominal tenderness.  Neurological:  Mental Status: She is alert and oriented to person, place, and time.  Psychiatric:        Mood and Affect: Mood normal.        Behavior: Behavior normal.    Diabetic Foot Exam - Simple   No data filed      Lab Results  Component Value Date   WBC 7.1 08/11/2021   HGB 14.2 08/11/2021   HCT 44.0 08/11/2021   PLT 126 (L) 08/11/2021   GLUCOSE 222 (H) 08/11/2021   CHOL 244 (H) 01/29/2021   TRIG 291 (H) 01/29/2021   HDL 35 (L) 01/29/2021   LDLCALC 155 (H) 01/29/2021   ALT 32 01/29/2021   AST 26 01/29/2021   NA 136 08/11/2021   K 5.4 (H) 08/11/2021   CL 104 08/11/2021   CREATININE 0.76 08/11/2021   BUN 14 08/11/2021   CO2 23 08/11/2021   TSH 0.051 (L) 11/05/2020   HGBA1C 10.3 (H) 01/29/2021   MICROALBUR 80 07/14/2020      Assessment & Plan:   Problem List Items Addressed This Visit       Cardiovascular and Mediastinum   Hypertension associated with diabetes ([place])    BP at goal.  The current medical regimen is effective;  continue present plan and medications. Recent cmp and cbc reviewed from ED.        Endocrine   Mixed diabetic hyperlipidemia associated with type 2 diabetes mellitus ([place]) - Primary    Both cholesterol and diabetes were poorly controlled in May 2022. Pt has been noncompliant with follow up.  Pt to come back in am for labs and then set up a chronic follow up as soon as possible.      Relevant Orders   CBC with Differential/Platelet   Comprehensive metabolic panel   Hemoglobin A1c   Lipid panel   POCT UA - Microalbumin   Hypothyroidism  (acquired)   Relevant Orders   TSH     Hematopoietic and Hemostatic   Thrombocytopenia ([place])    Noted on ED visit. Pt to come back in am so we can get fasting labs.         Other   Vaginal bleeding    Resolved.       Insomnia    Start on lunesta 2 mg once daily at night.      .  Meds ordered this encounter  Medications   eszopiclone (LUNESTA) 2 MG TABS tablet    Sig: Take 1 tablet (2 mg total) by mouth at bedtime as needed for sleep. Take immediately before bedtime    Dispense:  30 tablet    Refill:  2     Orders Placed This Encounter  Procedures   CBC with Differential/Platelet   Comprehensive metabolic panel   Hemoglobin A1c   Lipid panel   TSH   POCT UA - Microalbumin      Follow-up: No follow-ups on file.  An After Visit Summary was printed and given to the patient.    I,Lauren M Auman,acting as a scribe for [name], MD.,have documented all relevant documentation on the behalf of [name], MD,as directed by  [name], MD while in the presence of [name], MD.   [name], MD [place] [phone]

## 2021-09-04 ENCOUNTER — Ambulatory Visit: Payer: Managed Care, Other (non HMO)

## 2021-09-07 ENCOUNTER — Other Ambulatory Visit: Payer: Self-pay | Admitting: Family Medicine

## 2021-09-07 DIAGNOSIS — I10 Essential (primary) hypertension: Secondary | ICD-10-CM | POA: Insufficient documentation

## 2021-09-07 DIAGNOSIS — E1165 Type 2 diabetes mellitus with hyperglycemia: Secondary | ICD-10-CM | POA: Insufficient documentation

## 2021-09-07 DIAGNOSIS — E1169 Type 2 diabetes mellitus with other specified complication: Secondary | ICD-10-CM | POA: Insufficient documentation

## 2021-09-07 DIAGNOSIS — Z794 Long term (current) use of insulin: Secondary | ICD-10-CM | POA: Insufficient documentation

## 2021-09-11 ENCOUNTER — Telehealth: Payer: Self-pay

## 2021-09-11 NOTE — Telephone Encounter (Signed)
Attempted to call home phone. No answer, left generic VM requesting pt or spouse to return call.   [name], [place] 09/11/21 4:07 PM

## 2021-09-11 NOTE — Telephone Encounter (Signed)
Spouse left VM stating he was instructed to give call back about pt's medications. Pt takes levothyroxine , januvia 100 mg, and [name] 01-999 mg.  Terrill Mohr 09/11/21 2:47 PM

## 2021-09-14 DIAGNOSIS — E1159 Type 2 diabetes mellitus with other circulatory complications: Secondary | ICD-10-CM | POA: Insufficient documentation

## 2021-09-14 DIAGNOSIS — N939 Abnormal uterine and vaginal bleeding, unspecified: Secondary | ICD-10-CM | POA: Insufficient documentation

## 2021-09-14 DIAGNOSIS — G4700 Insomnia, unspecified: Secondary | ICD-10-CM | POA: Insufficient documentation

## 2021-09-14 DIAGNOSIS — F5104 Psychophysiologic insomnia: Secondary | ICD-10-CM | POA: Insufficient documentation

## 2021-09-14 DIAGNOSIS — I10 Essential (primary) hypertension: Secondary | ICD-10-CM | POA: Insufficient documentation

## 2021-09-14 DIAGNOSIS — D696 Thrombocytopenia, unspecified: Secondary | ICD-10-CM

## 2021-09-14 HISTORY — DX: Thrombocytopenia, unspecified: D69.6

## 2021-09-14 NOTE — Assessment & Plan Note (Signed)
Noted on ED visit. Pt to come back in am so we can get fasting labs.

## 2021-09-14 NOTE — Assessment & Plan Note (Addendum)
Both cholesterol and diabetes were poorly controlled in May 2022. Pt has been noncompliant with follow up.  Pt to come back in am for labs and then set up a chronic follow up as soon as possible.

## 2021-09-14 NOTE — Assessment & Plan Note (Signed)
Resolved

## 2021-09-14 NOTE — Assessment & Plan Note (Signed)
BP at goal.  The current medical regimen is effective;  continue present plan and medications. Recent cmp and cbc reviewed from ED.

## 2021-09-14 NOTE — Assessment & Plan Note (Signed)
Start on lunesta 2 mg once daily at night.

## 2021-09-24 ENCOUNTER — Other Ambulatory Visit: Payer: Self-pay | Admitting: Family Medicine

## 2021-09-26 ENCOUNTER — Other Ambulatory Visit: Payer: Managed Care, Other (non HMO)

## 2021-09-26 ENCOUNTER — Other Ambulatory Visit: Payer: Self-pay | Admitting: Family Medicine

## 2021-09-26 ENCOUNTER — Other Ambulatory Visit: Payer: Self-pay

## 2021-09-26 DIAGNOSIS — E039 Hypothyroidism, unspecified: Secondary | ICD-10-CM

## 2021-09-26 DIAGNOSIS — E1169 Type 2 diabetes mellitus with other specified complication: Secondary | ICD-10-CM

## 2021-09-26 LAB — POCT UA - MICROALBUMIN: Microalbumin Ur, POC: 80 mg/L

## 2021-09-27 ENCOUNTER — Other Ambulatory Visit: Payer: Self-pay | Admitting: Family Medicine

## 2021-09-27 ENCOUNTER — Other Ambulatory Visit: Payer: Self-pay

## 2021-09-27 DIAGNOSIS — E1121 Type 2 diabetes mellitus with diabetic nephropathy: Secondary | ICD-10-CM

## 2021-09-27 DIAGNOSIS — I10 Essential (primary) hypertension: Secondary | ICD-10-CM

## 2021-09-27 LAB — COMPREHENSIVE METABOLIC PANEL
ALT: 26 IU/L (ref 0–32)
AST: 24 IU/L (ref 0–40)
Albumin/Globulin Ratio: 1.4 (ref 1.2–2.2)
Albumin: 4.3 g/dL (ref 3.8–4.8)
Alkaline Phosphatase: 107 IU/L (ref 44–121)
BUN/Creatinine Ratio: 15 (ref 12–28)
BUN: 13 mg/dL (ref 8–27)
Bilirubin Total: 1.2 mg/dL (ref 0.0–1.2)
CO2: 22 mmol/L (ref 20–29)
Calcium: 9.7 mg/dL (ref 8.7–10.3)
Chloride: 101 mmol/L (ref 96–106)
Creatinine, Ser: 0.88 mg/dL (ref 0.57–1.00)
Globulin, Total: 3.1 g/dL (ref 1.5–4.5)
Glucose: 193 mg/dL — ABNORMAL HIGH (ref 70–99)
Potassium: 4.1 mmol/L (ref 3.5–5.2)
Sodium: 139 mmol/L (ref 134–144)
Total Protein: 7.4 g/dL (ref 6.0–8.5)
eGFR: 74 mL/min/{1.73_m2} (ref >59)

## 2021-09-27 LAB — CBC WITH DIFFERENTIAL/PLATELET
Basophils Absolute: 0.1 10*3/uL (ref 0.0–0.2)
Basos: 1 %
EOS (ABSOLUTE): 0.4 10*3/uL (ref 0.0–0.4)
Eos: 5 %
Hematocrit: 46 % (ref 34.0–46.6)
Hemoglobin: 15.4 g/dL (ref 11.1–15.9)
Immature Grans (Abs): 0 10*3/uL (ref 0.0–0.1)
Immature Granulocytes: 0 %
Lymphocytes Absolute: 3.3 10*3/uL — ABNORMAL HIGH (ref 0.7–3.1)
Lymphs: 41 %
MCH: 26.3 pg — ABNORMAL LOW (ref 26.6–33.0)
MCHC: 33.5 g/dL (ref 31.5–35.7)
MCV: 79 fL (ref 79–97)
Monocytes Absolute: 0.4 10*3/uL (ref 0.1–0.9)
Monocytes: 5 %
Neutrophils Absolute: 3.8 10*3/uL (ref 1.4–7.0)
Neutrophils: 48 %
Platelets: 210 10*3/uL (ref 150–450)
RBC: 5.86 x10E6/uL — ABNORMAL HIGH (ref 3.77–5.28)
RDW: 14.2 % (ref 11.7–15.4)
WBC: 8 10*3/uL (ref 3.4–10.8)

## 2021-09-27 LAB — TSH: TSH: 1.25 u[IU]/mL (ref 0.450–4.500)

## 2021-09-27 LAB — LIPID PANEL
Chol/HDL Ratio: 7.6 ratio — ABNORMAL HIGH (ref 0.0–4.4)
Cholesterol, Total: 236 mg/dL — ABNORMAL HIGH (ref 100–199)
HDL: 31 mg/dL — ABNORMAL LOW (ref >39)
LDL Chol Calc (NIH): 155 mg/dL — ABNORMAL HIGH (ref 0–99)
Triglycerides: 271 mg/dL — ABNORMAL HIGH (ref 0–149)
VLDL Cholesterol Cal: 50 mg/dL — ABNORMAL HIGH (ref 5–40)

## 2021-09-27 LAB — HEMOGLOBIN A1C
Est. average glucose Bld gHb Est-mCnc: 249 mg/dL
Hgb A1c MFr Bld: 10.3 % — ABNORMAL HIGH (ref 4.8–5.6)

## 2021-09-27 LAB — CARDIOVASCULAR RISK ASSESSMENT

## 2021-09-27 MED ORDER — LEVOTHYROXINE SODIUM 137 MCG PO TABS: 137.0000 ug | ORAL_TABLET | Freq: Every day | ORAL | 0 refills | Status: DC

## 2021-09-27 MED ORDER — XIGDUO XR 5-1000 MG PO TB24: 2.0000 | ORAL_TABLET | Freq: Every day | ORAL | 2 refills | Status: DC

## 2021-09-27 MED ORDER — ESZOPICLONE 2 MG PO TABS
2.0000 mg | ORAL_TABLET | Freq: Every evening | ORAL | 2 refills | Status: DC | PRN
Start: 2021-09-27 — End: 2021-12-25

## 2021-09-27 MED ORDER — ROSUVASTATIN CALCIUM 20 MG PO TABS: 20.0000 mg | ORAL_TABLET | Freq: Every day | ORAL | 1 refills | Status: DC

## 2021-09-27 MED ORDER — SITAGLIPTIN PHOSPHATE 100 MG PO TABS: 100.0000 mg | ORAL_TABLET | Freq: Every day | ORAL | 1 refills | Status: DC

## 2021-09-27 MED ORDER — LISINOPRIL 10 MG PO TABS: 10.0000 mg | ORAL_TABLET | Freq: Every day | ORAL | 1 refills | Status: DC

## 2021-09-27 MED ORDER — ICOSAPENT ETHYL 1 G PO CAPS
2.0000 g | ORAL_CAPSULE | Freq: Two times a day (BID) | ORAL | 0 refills | Status: DC
Start: 2021-09-27 — End: 2022-04-11

## 2021-09-27 NOTE — Progress Notes (Signed)
Blood count normal.  Liver function normal.  Kidney function normal.  Thyroid function normal.  Cholesterol: VERY high. Trigs and LDL both very high. HDL low.  HBA1C: 10.3. Poorly controlled.  Spilling protein in urine.  Please check which diabetes medicines and cholesterol medicines pt is taking.

## 2021-10-01 ENCOUNTER — Other Ambulatory Visit: Payer: Self-pay

## 2021-10-01 MED ORDER — LEVOTHYROXINE SODIUM 137 MCG PO TABS: 137.0000 ug | ORAL_TABLET | Freq: Every day | ORAL | 0 refills | Status: DC

## 2021-12-02 ENCOUNTER — Ambulatory Visit: Payer: Managed Care, Other (non HMO) | Admitting: Family Medicine

## 2021-12-02 ENCOUNTER — Other Ambulatory Visit: Payer: Self-pay

## 2021-12-02 ENCOUNTER — Other Ambulatory Visit: Payer: Self-pay | Admitting: Family Medicine

## 2021-12-02 ENCOUNTER — Encounter: Payer: Self-pay | Admitting: Family Medicine

## 2021-12-02 VITALS — BP 108/62 | HR 92 | Temp 98.2°F | Ht 60.0 in | Wt 172.0 lb

## 2021-12-02 DIAGNOSIS — F332 Major depressive disorder, recurrent severe without psychotic features: Secondary | ICD-10-CM

## 2021-12-02 MED ORDER — XIGDUO XR 5-1000 MG PO TB24: 2.0000 | ORAL_TABLET | Freq: Every day | ORAL | 0 refills | Status: DC

## 2021-12-02 MED ORDER — CITALOPRAM HYDROBROMIDE 20 MG PO TABS: 20.0000 mg | ORAL_TABLET | Freq: Every day | ORAL | 0 refills | Status: DC

## 2021-12-02 NOTE — Patient Instructions (Signed)
Start melatonin otc for sleep.  ?Start citalopram 20 mg once daily.  ?

## 2021-12-02 NOTE — Progress Notes (Unsigned)
Subjective:  Patient ID: Robin Bonilla, female    DOB: July 14, 1956  Age: 66 y.o. MRN: [id number]  Chief Complaint  Patient presents with   Depression   HPI: Depression: severely depressed. Her mother just passed 11/06/2021. Patient has had to leave work because she is unable to stop crying.   Media Office Visit from 12/02/2021 in [place]  PHQ-9 Total Score 21              Current Outpatient Medications on File Prior to Visit  Medication Sig Dispense Refill   ACCU-CHEK GUIDE test strip TEST STRIPS TWICE A DAY IN VITRO 30 DAYS 100 each 2   Accu-Chek Softclix Lancets lancets 3 (three) times daily.     Blood Glucose Monitoring Suppl (ACCU-CHEK GUIDE) w/Device KIT daily. use as directed     Dapagliflozin-metFORMIN HCl ER ([name]) 01-999 MG TB24 Take 2 tablets by mouth daily. STOP METFORMIN. 180 tablet 0   eszopiclone (LUNESTA) 2 MG TABS tablet Take 1 tablet (2 mg total) by mouth at bedtime as needed for sleep. Take immediately before bedtime 30 tablet 2   icosapent Ethyl (VASCEPA) 1 g capsule Take 2 capsules (2 g total) by mouth 2 (two) times daily. 360 capsule 0   levothyroxine (SYNTHROID) 137 MCG tablet Take 1 tablet (137 mcg total) by mouth daily before breakfast. 90 tablet 0   lisinopril (ZESTRIL) 10 MG tablet Take 1 tablet (10 mg total) by mouth daily. 90 tablet 1   NOVOFINE 32G X 6 MM MISC USE AS DIRECTED ONCE A DAY SUBCUTANEOUS 30 DAYS     rosuvastatin (CRESTOR) 20 MG tablet Take 1 tablet (20 mg total) by mouth daily. 90 tablet 1   sitaGLIPtin (JANUVIA) 100 MG tablet Take 1 tablet (100 mg total) by mouth daily. 90 tablet 1   No current facility-administered medications on file prior to visit.   Past Medical History:  Diagnosis Date   Acute sinusitis    Depression    Diabetes mellitus 2005   Dysthymic disorder    Hirsutism    Hyperlipidemia    Rash, skin    Thyroid disease    Past Surgical History:  Procedure Laterality Date   BACK  SURGERY     CHOLECYSTECTOMY  05/30/11   PARTIAL HYSTERECTOMY     TONSILLECTOMY     TUBAL LIGATION      Family History  Problem Relation Age of Onset   Breast cancer Mother 64   CAD Father    CAD Maternal Grandmother    Hypertension Maternal Grandmother    Diabetes Paternal Grandfather    Diabetes Paternal Aunt    Social History   Socioeconomic History   Marital status: Married    Spouse name: Not on file   Number of children: Not on file   Years of education: Not on file   Highest education level: Not on file  Occupational History   Not on file  Tobacco Use   Smoking status: Never   Smokeless tobacco: Never  Substance and Sexual Activity   Alcohol use: No    Alcohol/week: 0.0 standard drinks   Drug use: No   Sexual activity: Not on file  Other Topics Concern   Not on file  Social History Narrative   Not on file   Social Determinants of Health   Financial Resource Strain: Not on file  Food Insecurity: Not on file  Transportation Needs: Not on file  Physical Activity: Not on file  Stress:  Not on file  Social Connections: Not on file    Review of Systems  Constitutional:  Positive for fatigue. Negative for appetite change and fever.  HENT:  Negative for congestion, ear pain, sinus pressure and sore throat.   Respiratory:  Negative for cough, chest tightness, shortness of breath and wheezing.   Cardiovascular:  Negative for chest pain and palpitations.  Gastrointestinal:  Negative for abdominal pain, constipation, diarrhea, nausea and vomiting.  Genitourinary:  Negative for dysuria and hematuria.  Musculoskeletal:  Negative for arthralgias, back pain, joint swelling and myalgias.  Skin:  Negative for rash.  Neurological:  Negative for dizziness, weakness and headaches.  Psychiatric/Behavioral:  Positive for dysphoric mood. The patient is not nervous/anxious.     Objective:  BP 108/62 (BP Location: Left Arm, Patient Position: Sitting)    Pulse 92    Temp 98.2  F (36.8 C) (Oral)    Ht 5' (1.524 m)    Wt 172 lb (78 kg)    SpO2 98%    BMI 33.59 kg/m   BP/Weight 12/02/2021 09/03/2021 51/70/0174  Systolic BP 944 967 591  Diastolic BP 62 72 69  Wt. (Lbs) 172 173 170  BMI 33.59 33.79 33.2    Physical Exam Vitals reviewed.  Constitutional:      Appearance: Normal appearance. She is normal weight.  Neck:     Vascular: No carotid bruit.  Cardiovascular:     Rate and Rhythm: Normal rate and regular rhythm.     Heart sounds: Normal heart sounds.  Pulmonary:     Effort: Pulmonary effort is normal. No respiratory distress.     Breath sounds: Normal breath sounds.  Abdominal:     General: Abdomen is flat. Bowel sounds are normal.     Palpations: Abdomen is soft.     Tenderness: There is no abdominal tenderness.  Neurological:     Mental Status: She is alert and oriented to person, place, and time.  Psychiatric:        Mood and Affect: Mood normal.        Behavior: Behavior normal.    Diabetic Foot Exam - Simple   No data filed      Lab Results  Component Value Date   WBC 8.0 09/26/2021   HGB 15.4 09/26/2021   HCT 46.0 09/26/2021   PLT 210 09/26/2021   GLUCOSE 193 (H) 09/26/2021   CHOL 236 (H) 09/26/2021   TRIG 271 (H) 09/26/2021   HDL 31 (L) 09/26/2021   LDLCALC 155 (H) 09/26/2021   ALT 26 09/26/2021   AST 24 09/26/2021   NA 139 09/26/2021   K 4.1 09/26/2021   CL 101 09/26/2021   CREATININE 0.88 09/26/2021   BUN 13 09/26/2021   CO2 22 09/26/2021   TSH 1.250 09/26/2021   HGBA1C 10.3 (H) 09/26/2021   MICROALBUR 80 09/26/2021      Assessment & Plan:   Problem List Items Addressed This Visit   None .  No orders of the defined types were placed in this encounter.   No orders of the defined types were placed in this encounter.    Follow-up: No follow-ups on file.  An After Visit Summary was printed and given to the patient.    I,Niurka Benecke M Kaelyn Nauta,acting as a scribe for [name], MD.,have documented all relevant  documentation on the behalf of [name], MD,as directed by  [name], MD while in the presence of [name], MD.    [name], MD [facility] [phone]

## 2021-12-04 ENCOUNTER — Other Ambulatory Visit: Payer: Managed Care, Other (non HMO)

## 2021-12-04 ENCOUNTER — Other Ambulatory Visit: Payer: Self-pay

## 2021-12-04 ENCOUNTER — Other Ambulatory Visit: Payer: Self-pay | Admitting: Family Medicine

## 2021-12-04 DIAGNOSIS — I152 Hypertension secondary to endocrine disorders: Secondary | ICD-10-CM

## 2021-12-04 DIAGNOSIS — E1159 Type 2 diabetes mellitus with other circulatory complications: Secondary | ICD-10-CM

## 2021-12-04 DIAGNOSIS — Z1231 Encounter for screening mammogram for malignant neoplasm of breast: Secondary | ICD-10-CM

## 2021-12-05 LAB — COMPREHENSIVE METABOLIC PANEL
ALT: 23 IU/L (ref 0–32)
AST: 16 IU/L (ref 0–40)
Albumin/Globulin Ratio: 1.6 (ref 1.2–2.2)
Albumin: 4.7 g/dL (ref 3.8–4.8)
Alkaline Phosphatase: 91 IU/L (ref 44–121)
BUN/Creatinine Ratio: 23 (ref 12–28)
BUN: 19 mg/dL (ref 8–27)
Bilirubin Total: 1.2 mg/dL (ref 0.0–1.2)
CO2: 25 mmol/L (ref 20–29)
Calcium: 10.3 mg/dL (ref 8.7–10.3)
Chloride: 100 mmol/L (ref 96–106)
Creatinine, Ser: 0.84 mg/dL (ref 0.57–1.00)
Globulin, Total: 2.9 g/dL (ref 1.5–4.5)
Glucose: 195 mg/dL — ABNORMAL HIGH (ref 70–99)
Potassium: 4.9 mmol/L (ref 3.5–5.2)
Sodium: 135 mmol/L (ref 134–144)
Total Protein: 7.6 g/dL (ref 6.0–8.5)
eGFR: 78 mL/min/{1.73_m2} (ref >59)

## 2021-12-05 LAB — CBC WITH DIFFERENTIAL/PLATELET
Basophils Absolute: 0 10*3/uL (ref 0.0–0.2)
Basos: 1 %
EOS (ABSOLUTE): 0.3 10*3/uL (ref 0.0–0.4)
Eos: 3 %
Hematocrit: 45.2 % (ref 34.0–46.6)
Hemoglobin: 14.8 g/dL (ref 11.1–15.9)
Immature Grans (Abs): 0 10*3/uL (ref 0.0–0.1)
Immature Granulocytes: 1 %
Lymphocytes Absolute: 3.4 10*3/uL — ABNORMAL HIGH (ref 0.7–3.1)
Lymphs: 39 %
MCH: 26.9 pg (ref 26.6–33.0)
MCHC: 32.7 g/dL (ref 31.5–35.7)
MCV: 82 fL (ref 79–97)
Monocytes Absolute: 0.4 10*3/uL (ref 0.1–0.9)
Monocytes: 5 %
Neutrophils Absolute: 4.5 10*3/uL (ref 1.4–7.0)
Neutrophils: 51 %
Platelets: 201 10*3/uL (ref 150–450)
RBC: 5.51 x10E6/uL — ABNORMAL HIGH (ref 3.77–5.28)
RDW: 14.2 % (ref 11.7–15.4)
WBC: 8.6 10*3/uL (ref 3.4–10.8)

## 2021-12-05 NOTE — Progress Notes (Signed)
Blood count normal.  ?Liver function normal.  ?Kidney function normal.  ?Glucose 195.  ?We can discuss further next week.

## 2021-12-09 ENCOUNTER — Other Ambulatory Visit: Payer: Self-pay

## 2021-12-09 ENCOUNTER — Ambulatory Visit: Payer: Managed Care, Other (non HMO) | Admitting: Family Medicine

## 2021-12-09 VITALS — BP 110/70 | HR 76 | Temp 97.3°F | Resp 18 | Ht 60.0 in | Wt 170.0 lb

## 2021-12-09 DIAGNOSIS — E039 Hypothyroidism, unspecified: Secondary | ICD-10-CM | POA: Diagnosis not present

## 2021-12-09 DIAGNOSIS — E782 Mixed hyperlipidemia: Secondary | ICD-10-CM

## 2021-12-09 DIAGNOSIS — E1169 Type 2 diabetes mellitus with other specified complication: Secondary | ICD-10-CM | POA: Diagnosis not present

## 2021-12-09 DIAGNOSIS — Z6833 Body mass index (BMI) 33.0-33.9, adult: Secondary | ICD-10-CM

## 2021-12-09 DIAGNOSIS — E1159 Type 2 diabetes mellitus with other circulatory complications: Secondary | ICD-10-CM

## 2021-12-09 DIAGNOSIS — E6609 Other obesity due to excess calories: Secondary | ICD-10-CM

## 2021-12-09 DIAGNOSIS — I152 Hypertension secondary to endocrine disorders: Secondary | ICD-10-CM

## 2021-12-09 DIAGNOSIS — F332 Major depressive disorder, recurrent severe without psychotic features: Secondary | ICD-10-CM | POA: Diagnosis not present

## 2021-12-09 MED ORDER — RYBELSUS 3 MG PO TABS: 3.0000 mg | ORAL_TABLET | Freq: Every day | ORAL | 0 refills | Status: DC

## 2021-12-09 NOTE — Patient Instructions (Signed)
Continue januvia 100 mg daily until you run out or you return for your next appointment whichever comes first.  ?Start rybelsus 3 mg daily in am.  ?Check sugars daily and bring meter or log book with numbers to next appointment ?

## 2021-12-09 NOTE — Progress Notes (Signed)
? ?Subjective:  ?Patient ID: Robin Bonilla, female    DOB: 1956-08-30  Age: 66 y.o. MRN: [id number] ? ?Chief Complaint  ?Patient presents with  ? Depression  ? ? ?Depression ?       Associated symptoms include no fatigue, no appetite change, no myalgias and no headaches. ?  ?Current Outpatient Medications on File Prior to Visit  ?Medication Sig Dispense Refill  ? ACCU-CHEK GUIDE test strip TEST STRIPS TWICE A DAY IN VITRO 30 DAYS 100 each 2  ? Accu-Chek Softclix Lancets lancets 3 (three) times daily.    ? Blood Glucose Monitoring Suppl (ACCU-CHEK GUIDE) w/Device KIT daily. use as directed    ? citalopram (CELEXA) 20 MG tablet Take 1 tablet (20 mg total) by mouth daily. 30 tablet 0  ? Dapagliflozin-metFORMIN HCl ER ([name]) 01-999 MG TB24 Take 2 tablets by mouth daily. STOP METFORMIN. 180 tablet 0  ? eszopiclone (LUNESTA) 2 MG TABS tablet Take 1 tablet (2 mg total) by mouth at bedtime as needed for sleep. Take immediately before bedtime 30 tablet 2  ? icosapent Ethyl (VASCEPA) 1 g capsule Take 2 capsules (2 g total) by mouth 2 (two) times daily. 360 capsule 0  ? levothyroxine (SYNTHROID) 137 MCG tablet Take 1 tablet (137 mcg total) by mouth daily before breakfast. 90 tablet 0  ? lisinopril (ZESTRIL) 10 MG tablet Take 1 tablet (10 mg total) by mouth daily. 90 tablet 1  ? NOVOFINE 32G X 6 MM MISC USE AS DIRECTED ONCE A DAY SUBCUTANEOUS 30 DAYS    ? rosuvastatin (CRESTOR) 20 MG tablet Take 1 tablet (20 mg total) by mouth daily. 90 tablet 1  ? sitaGLIPtin (JANUVIA) 100 MG tablet Take 1 tablet (100 mg total) by mouth daily. 90 tablet 1  ? ?No current facility-administered medications on file prior to visit.  ? ?Past Medical History:  ?Diagnosis Date  ? Acute sinusitis   ? Depression   ? Diabetes mellitus 2005  ? Dysthymic disorder   ? Hirsutism   ? Hyperlipidemia   ? Rash, skin   ? Thyroid disease   ? ?Past Surgical History:  ?Procedure Laterality Date  ? BACK SURGERY    ? CHOLECYSTECTOMY  05/30/11  ? PARTIAL  HYSTERECTOMY    ? TONSILLECTOMY    ? TUBAL LIGATION    ?  ?Family History  ?Problem Relation Age of Onset  ? Breast cancer Mother 31  ? CAD Father   ? CAD Maternal Grandmother   ? Hypertension Maternal Grandmother   ? Diabetes Paternal Grandfather   ? Diabetes Paternal Aunt   ? ?Social History  ? ?Socioeconomic History  ? Marital status: Married  ?  Spouse name: Not on file  ? Number of children: Not on file  ? Years of education: Not on file  ? Highest education level: Not on file  ?Occupational History  ? Not on file  ?Tobacco Use  ? Smoking status: Never  ? Smokeless tobacco: Never  ?Substance and Sexual Activity  ? Alcohol use: No  ?  Alcohol/week: 0.0 standard drinks  ? Drug use: No  ? Sexual activity: Not on file  ?Other Topics Concern  ? Not on file  ?Social History Narrative  ? Not on file  ? ?Social Determinants of Health  ? ?Financial Resource Strain: Not on file  ?Food Insecurity: Not on file  ?Transportation Needs: Not on file  ?Physical Activity: Not on file  ?Stress: Not on file  ?Social Connections: Not on file  ? ? ?  Review of Systems  ?Constitutional:  Negative for appetite change, fatigue and fever.  ?HENT:  Negative for congestion, ear pain, sinus pressure and sore throat.   ?Respiratory:  Negative for cough, chest tightness, shortness of breath and wheezing.   ?Cardiovascular:  Negative for chest pain and palpitations.  ?Gastrointestinal:  Negative for abdominal pain, constipation, diarrhea, nausea and vomiting.  ?Genitourinary:  Negative for dysuria and hematuria.  ?Musculoskeletal:  Negative for arthralgias, back pain, joint swelling and myalgias.  ?Skin:  Negative for rash.  ?Neurological:  Negative for dizziness, weakness and headaches.  ?Psychiatric/Behavioral:  Positive for depression. Negative for dysphoric mood. The patient is not nervous/anxious.   ? ? ?Objective:  ?There were no vitals taken for this visit. ? ?BP/Weight 12/02/2021 09/03/2021 08/11/2021  ?Systolic BP 353 299 242  ?Diastolic  BP 62 72 69  ?Wt. (Lbs) 172 173 170  ?BMI 33.59 33.79 33.2  ? ? ?Physical Exam ?Vitals reviewed.  ?Constitutional:   ?   Appearance: Normal appearance. She is normal weight.  ?Neck:  ?   Vascular: No carotid bruit.  ?Cardiovascular:  ?   Rate and Rhythm: Normal rate and regular rhythm.  ?   Heart sounds: Normal heart sounds.  ?Pulmonary:  ?   Effort: Pulmonary effort is normal. No respiratory distress.  ?   Breath sounds: Normal breath sounds.  ?Abdominal:  ?   General: Abdomen is flat. Bowel sounds are normal.  ?   Palpations: Abdomen is soft.  ?   Tenderness: There is no abdominal tenderness.  ?Neurological:  ?   Mental Status: She is alert and oriented to person, place, and time.  ?Psychiatric:     ?   Mood and Affect: Mood normal.     ?   Behavior: Behavior normal.  ? ? ?Diabetic Foot Exam - Simple   ?No data filed ?  ?  ? ?Lab Results  ?Component Value Date  ? WBC 8.6 12/04/2021  ? HGB 14.8 12/04/2021  ? HCT 45.2 12/04/2021  ? PLT 201 12/04/2021  ? GLUCOSE 195 (H) 12/04/2021  ? CHOL 236 (H) 09/26/2021  ? TRIG 271 (H) 09/26/2021  ? HDL 31 (L) 09/26/2021  ? [place] 155 (H) 09/26/2021  ? ALT 23 12/04/2021  ? AST 16 12/04/2021  ? NA 135 12/04/2021  ? K 4.9 12/04/2021  ? CL 100 12/04/2021  ? CREATININE 0.84 12/04/2021  ? BUN 19 12/04/2021  ? CO2 25 12/04/2021  ? TSH 1.250 09/26/2021  ? HGBA1C 10.3 (H) 09/26/2021  ? MICROALBUR 80 09/26/2021  ? ? ? ? ?Assessment & Plan:  ? ?Problem List Items Addressed This Visit   ? ?  ? Other  ? Depressed - Primary  ?. ? ?No orders of the defined types were placed in this encounter. ? ? ?No orders of the defined types were placed in this encounter. ?  ? ?Follow-up: No follow-ups on file. ? ?An After Visit Summary was printed and given to the patient. ? ?[name], MD ?[place] ?[phone]

## 2021-12-09 NOTE — Progress Notes (Unsigned)
Subjective:  Patient ID: Robin Bonilla, female    DOB: 09/01/1956  Age: 66 y.o. MRN: [id number]  Chief Complaint  Patient presents with   Depression   HPI Diabetes:  Taking januvia 100 mg daily, xigduo xr 01/999 mg 2 daily. Not checking sugars. Sugar was 187 on recent labs. Has had some yeast infections in the last few months.  Checking feet daily.  Has polydipsia and polyuria. No polyphagia.  Not eating much.   Hyperlipidemia: on rosuvastatin 20 mg daily and vascepa 1 gm 2 capsules twice daily.  Hypertension: on zestril 10 mg daily.   Current Outpatient Medications on File Prior to Visit  Medication Sig Dispense Refill   ACCU-CHEK GUIDE test strip TEST STRIPS TWICE A DAY IN VITRO 30 DAYS 100 each 2   Accu-Chek Softclix Lancets lancets 3 (three) times daily.     Blood Glucose Monitoring Suppl (ACCU-CHEK GUIDE) w/Device KIT daily. use as directed     citalopram (CELEXA) 20 MG tablet Take 1 tablet (20 mg total) by mouth daily. 30 tablet 0   Dapagliflozin-metFORMIN HCl ER ([name]) 01-999 MG TB24 Take 2 tablets by mouth daily. STOP METFORMIN. 180 tablet 0   eszopiclone (LUNESTA) 2 MG TABS tablet Take 1 tablet (2 mg total) by mouth at bedtime as needed for sleep. Take immediately before bedtime 30 tablet 2   icosapent Ethyl (VASCEPA) 1 g capsule Take 2 capsules (2 g total) by mouth 2 (two) times daily. 360 capsule 0   levothyroxine (SYNTHROID) 137 MCG tablet Take 1 tablet (137 mcg total) by mouth daily before breakfast. 90 tablet 0   lisinopril (ZESTRIL) 10 MG tablet Take 1 tablet (10 mg total) by mouth daily. 90 tablet 1   NOVOFINE 32G X 6 MM MISC USE AS DIRECTED ONCE A DAY SUBCUTANEOUS 30 DAYS     rosuvastatin (CRESTOR) 20 MG tablet Take 1 tablet (20 mg total) by mouth daily. 90 tablet 1   sitaGLIPtin (JANUVIA) 100 MG tablet Take 1 tablet (100 mg total) by mouth daily. 90 tablet 1   No current facility-administered medications on file prior to visit.   Past Medical  History:  Diagnosis Date   Acute sinusitis    Depression    Diabetes mellitus 2005   Dysthymic disorder    Hirsutism    Hyperlipidemia    Rash, skin    Thyroid disease    Past Surgical History:  Procedure Laterality Date   BACK SURGERY     CHOLECYSTECTOMY  05/30/11   PARTIAL HYSTERECTOMY     TONSILLECTOMY     TUBAL LIGATION      Family History  Problem Relation Age of Onset   Breast cancer Mother 52   CAD Father    CAD Maternal Grandmother    Hypertension Maternal Grandmother    Diabetes Paternal Grandfather    Diabetes Paternal Aunt    Social History   Socioeconomic History   Marital status: Married    Spouse name: Not on file   Number of children: Not on file   Years of education: Not on file   Highest education level: Not on file  Occupational History   Not on file  Tobacco Use   Smoking status: Never   Smokeless tobacco: Never  Substance and Sexual Activity   Alcohol use: No    Alcohol/week: 0.0 standard drinks   Drug use: No   Sexual activity: Not on file  Other Topics Concern   Not on file  Social History  Narrative   Not on file   Social Determinants of Health   Financial Resource Strain: Not on file  Food Insecurity: Not on file  Transportation Needs: Not on file  Physical Activity: Not on file  Stress: Not on file  Social Connections: Not on file    Review of Systems  Constitutional:  Negative for chills, fatigue and fever.  HENT:  Negative for congestion, rhinorrhea and sore throat.   Respiratory:  Negative for cough and shortness of breath.   Cardiovascular:  Negative for chest pain.  Gastrointestinal:  Negative for abdominal pain, constipation, diarrhea, nausea and vomiting.  Genitourinary:  Negative for dysuria and urgency.  Musculoskeletal:  Negative for back pain and myalgias.  Neurological:  Negative for dizziness, weakness, light-headedness and headaches.  Psychiatric/Behavioral:  Positive for dysphoric mood. The patient is not  nervous/anxious.     Objective:  BP 110/70    Pulse 76    Temp (!) 97.3 F (36.3 C)    Resp 18    Ht 5' (1.524 m)    Wt 170 lb (77.1 kg)    BMI 33.20 kg/m   BP/Weight 12/09/2021 12/02/2021 13/0/8657  Systolic BP 846 962 952  Diastolic BP 70 62 72  Wt. (Lbs) 170 172 173  BMI 33.2 33.59 33.79    Physical Exam Vitals reviewed.  Constitutional:      Appearance: Normal appearance. She is normal weight.  Neck:     Vascular: No carotid bruit.  Cardiovascular:     Rate and Rhythm: Normal rate and regular rhythm.     Heart sounds: Normal heart sounds.  Pulmonary:     Effort: Pulmonary effort is normal. No respiratory distress.     Breath sounds: Normal breath sounds.  Abdominal:     General: Abdomen is flat. Bowel sounds are normal.     Palpations: Abdomen is soft.     Tenderness: There is no abdominal tenderness.  Neurological:     Mental Status: She is alert and oriented to person, place, and time.  Psychiatric:        Behavior: Behavior normal.     Comments: sad    Diabetic Foot Exam - Simple   No data filed      Lab Results  Component Value Date   WBC 8.6 12/04/2021   HGB 14.8 12/04/2021   HCT 45.2 12/04/2021   PLT 201 12/04/2021   GLUCOSE 195 (H) 12/04/2021   CHOL 236 (H) 09/26/2021   TRIG 271 (H) 09/26/2021   HDL 31 (L) 09/26/2021   LDLCALC 155 (H) 09/26/2021   ALT 23 12/04/2021   AST 16 12/04/2021   NA 135 12/04/2021   K 4.9 12/04/2021   CL 100 12/04/2021   CREATININE 0.84 12/04/2021   BUN 19 12/04/2021   CO2 25 12/04/2021   TSH 1.250 09/26/2021   HGBA1C 10.3 (H) 09/26/2021   MICROALBUR 80 09/26/2021      Assessment & Plan:   Problem List Items Addressed This Visit       Other   Depressed - Primary  .  No orders of the defined types were placed in this encounter.   No orders of the defined types were placed in this encounter.    Follow-up: No follow-ups on file.  An After Visit Summary was printed and given to the patient.  [name], MD [facility] [phone]

## 2021-12-16 DIAGNOSIS — F332 Major depressive disorder, recurrent severe without psychotic features: Secondary | ICD-10-CM | POA: Insufficient documentation

## 2021-12-16 NOTE — Assessment & Plan Note (Signed)
Start citalopram 20 mg daily  ?Start melatonin for insomnia.  ?Recommend counseling with Hospice.  ?I put patient out of work for 4 weeks due to severe depression.  ?

## 2021-12-20 ENCOUNTER — Encounter: Payer: Self-pay | Admitting: Family Medicine

## 2021-12-20 DIAGNOSIS — E6609 Other obesity due to excess calories: Secondary | ICD-10-CM | POA: Insufficient documentation

## 2021-12-20 NOTE — Assessment & Plan Note (Addendum)
Control: poor although improving since December.  ?Recommend check sugars fasting daily. ?Recommend check feet daily. ?Recommend annual eye exams. ?Medicines: Continue januvia 100 mg daily until you run out or you return for your next appointment whichever comes first. Start rybelsus 3 mg daily in am.  ?Check sugars daily and bring meter or log book with numbers to next appointment. Continue xigduo xr 01/999 mg 2 daily. ?Continue to work on eating a healthy diet and exercise.  ?Too early to do A1C. ? ?

## 2021-12-20 NOTE — Assessment & Plan Note (Signed)
Well controlled.  ?No changes to medicines. Continue zestril 10 mg daily.  ?Continue to work on eating a healthy diet and exercise.  ?Labs drawn today.  ? ?

## 2021-12-20 NOTE — Assessment & Plan Note (Signed)
The current medical regimen is effective;  continue present plan and medications. ?Continue levothyroxine 137 mcg once daily in am. ?

## 2021-12-20 NOTE — Assessment & Plan Note (Signed)
Continue citalopram 20 mg daily. Has only been on it for one week.  ? ?

## 2021-12-20 NOTE — Assessment & Plan Note (Signed)
Recommend continue to work on eating healthy diet and exercise.  

## 2021-12-25 ENCOUNTER — Other Ambulatory Visit: Payer: Self-pay | Admitting: Family Medicine

## 2021-12-25 ENCOUNTER — Other Ambulatory Visit: Payer: Self-pay

## 2021-12-25 ENCOUNTER — Ambulatory Visit
Admission: RE | Admit: 2021-12-25 | Discharge: 2021-12-25 | Disposition: A | Discharge status: Home / Self Care | Payer: Managed Care, Other (non HMO) | Source: Ambulatory Visit | Attending: Family Medicine | Admitting: Family Medicine

## 2021-12-25 ENCOUNTER — Ambulatory Visit: Payer: Managed Care, Other (non HMO) | Admitting: Family Medicine

## 2021-12-25 VITALS — BP 110/52 | HR 84 | Temp 97.1°F | Resp 18 | Ht 60.0 in | Wt 169.0 lb

## 2021-12-25 DIAGNOSIS — D696 Thrombocytopenia, unspecified: Secondary | ICD-10-CM

## 2021-12-25 DIAGNOSIS — I152 Hypertension secondary to endocrine disorders: Secondary | ICD-10-CM

## 2021-12-25 DIAGNOSIS — F332 Major depressive disorder, recurrent severe without psychotic features: Secondary | ICD-10-CM | POA: Diagnosis not present

## 2021-12-25 DIAGNOSIS — F5104 Psychophysiologic insomnia: Secondary | ICD-10-CM

## 2021-12-25 DIAGNOSIS — Z1231 Encounter for screening mammogram for malignant neoplasm of breast: Secondary | ICD-10-CM

## 2021-12-25 DIAGNOSIS — E1159 Type 2 diabetes mellitus with other circulatory complications: Secondary | ICD-10-CM

## 2021-12-25 DIAGNOSIS — E6609 Other obesity due to excess calories: Secondary | ICD-10-CM

## 2021-12-25 DIAGNOSIS — Z6833 Body mass index (BMI) 33.0-33.9, adult: Secondary | ICD-10-CM

## 2021-12-25 NOTE — Progress Notes (Signed)
? ?Subjective:  ?Patient ID: Robin Bonilla, female    DOB: [birth year]  Age: 66 y.o. MRN: [id number] ? ?Chief Complaint  ?Patient presents with  ? Diabetes  ? Hyperlipidemia  ? ? ?HPI ?Diabetes:  ?Sugars 182-231.  ?Medications: rybelsus 3 mg once daily and januvia and metformin ?Continue januvia 100 mg daily until you run out or you return for your next appointment whichever comes first. Start rybelsus 3 mg daily in am.  ?Check sugars daily and bring meter or log book with numbers to next appointment. Continue xigduo xr 01/999 mg 2 daily. ?Checks feet daily.  ? ?Hyperlipidemia: ?Stopped crestor because it makes her nausea. Not taking vascepa due to being too expensive.  ? ?Persistent depression: Patient has been on citalopram 20 mg daily x 3-4 weeks, but does not feel it has helped.  Patient has significant anhedonia. She is very unmotivated to do anything. She has been unable to return to work. She is crying a lot.  ? ? ?  12/25/2021  ?  9:04 AM 12/09/2021  ?  8:52 AM 12/02/2021  ? 12:03 PM  ?PHQ9 SCORE ONLY  ?PHQ-9 Total Score _0 ? ? ? ? ?Current Outpatient Medications on File Prior to Visit  ?Medication Sig Dispense Refill  ? Dapagliflozin-metFORMIN HCl ER ([name]) 01-999 MG TB24 Take 2 tablets by mouth daily. STOP METFORMIN. 180 tablet 0  ? levothyroxine (SYNTHROID) 137 MCG tablet Take 1 tablet (137 mcg total) by mouth daily before breakfast. 90 tablet 0  ? Semaglutide ([name]) 3 MG TABS Take 3 mg by mouth daily. 30 tablet 0  ? ACCU-CHEK GUIDE test strip TEST STRIPS TWICE A DAY IN VITRO 30 DAYS 100 each 2  ? Accu-Chek Softclix Lancets lancets 3 (three) times daily.    ? Blood Glucose Monitoring Suppl (ACCU-CHEK GUIDE) w/Device KIT daily. use as directed    ? icosapent Ethyl (VASCEPA) 1 g capsule Take 2 capsules (2 g total) by mouth 2 (two) times daily. (Patient not taking: Reported on 12/25/2021) 360 capsule 0  ? lisinopril (ZESTRIL) 10 MG tablet Take 1 tablet (10 mg total) by mouth daily. 90 tablet  1  ? rosuvastatin (CRESTOR) 20 MG tablet Take 1 tablet (20 mg total) by mouth daily. (Patient not taking: Reported on 12/25/2021) 90 tablet 1  ? ?No current facility-administered medications on file prior to visit.  ? ?Past Medical History:  ?Diagnosis Date  ? Acute sinusitis   ? Depression   ? Diabetes mellitus 2005  ? Dysthymic disorder   ? Hirsutism   ? Hyperlipidemia   ? Rash, skin   ? Thyroid disease   ? ?Past Surgical History:  ?Procedure Laterality Date  ? BACK SURGERY    ? CHOLECYSTECTOMY  05/30/11  ? PARTIAL HYSTERECTOMY    ? TONSILLECTOMY    ? TUBAL LIGATION    ?  ?Family History  ?Problem Relation Age of Onset  ? Breast cancer Mother 75  ? CAD Father   ? CAD Maternal Grandmother   ? Hypertension Maternal Grandmother   ? Diabetes Paternal Grandfather   ? Diabetes Paternal Aunt   ? ?Social History  ? ?Socioeconomic History  ? Marital status: Married  ?  Spouse name: Not on file  ? Number of children: Not on file  ? Years of education: Not on file  ? Highest education level: Not on file  ?Occupational History  ? Not on file  ?Tobacco Use  ? Smoking status: Never  ? Smokeless  tobacco: Never  ?Substance and Sexual Activity  ? Alcohol use: No  ?  Alcohol/week: 0.0 standard drinks  ? Drug use: No  ? Sexual activity: Not on file  ?Other Topics Concern  ? Not on file  ?Social History Narrative  ? Not on file  ? ?Social Determinants of Health  ? ?Financial Resource Strain: Not on file  ?Food Insecurity: Not on file  ?Transportation Needs: Not on file  ?Physical Activity: Not on file  ?Stress: Not on file  ?Social Connections: Not on file  ? ? ?Review of Systems  ?Constitutional:  Negative for chills, fatigue and fever.  ?HENT:  Negative for congestion, rhinorrhea and sore throat.   ?Respiratory:  Negative for cough and shortness of breath.   ?Cardiovascular:  Negative for chest pain.  ?Gastrointestinal:  Negative for abdominal pain, constipation, diarrhea, nausea and vomiting.  ?Genitourinary:  Negative for dysuria  and urgency.  ?Musculoskeletal:  Negative for back pain and myalgias.  ?Neurological:  Negative for dizziness, weakness, light-headedness and headaches.  ?Psychiatric/Behavioral:  Positive for dysphoric mood. The patient is not nervous/anxious.   ? ? ?Objective:  ?BP (!) 110/52   Pulse 84   Temp (!) 97.1 ?F (36.2 ?C)   Resp 18   Ht 5' (1.524 m)   Wt 169 lb (76.7 kg)   BMI 33.01 kg/m?  ? ? ?  12/25/2021  ?  8:59 AM 12/09/2021  ?  8:50 AM 12/02/2021  ? 11:53 AM  ?BP/Weight  ?Systolic BP [phone]  ?Diastolic BP 52 70 62  ?Wt. (Lbs) 169 170 172  ?BMI 33.01 kg/m2 33.2 kg/m2 33.59 kg/m2  ? ? ?Physical Exam ?Vitals reviewed.  ?Constitutional:   ?   Appearance: Normal appearance. She is normal weight.  ?Neck:  ?   Vascular: No carotid bruit.  ?Cardiovascular:  ?   Rate and Rhythm: Normal rate and regular rhythm.  ?   Heart sounds: Normal heart sounds.  ?Pulmonary:  ?   Effort: Pulmonary effort is normal. No respiratory distress.  ?   Breath sounds: Normal breath sounds.  ?Abdominal:  ?   General: Abdomen is flat. Bowel sounds are normal.  ?   Palpations: Abdomen is soft.  ?   Tenderness: There is no abdominal tenderness.  ?Neurological:  ?   Mental Status: She is alert and oriented to person, place, and time.  ?Psychiatric:     ?   Behavior: Behavior normal.  ?   Comments: Crying.  ? ? ?Diabetic Foot Exam - Simple   ?No data filed ?  ?  ? ?Lab Results  ?Component Value Date  ? WBC 8.6 12/04/2021  ? HGB 14.8 12/04/2021  ? HCT 45.2 12/04/2021  ? PLT 201 12/04/2021  ? GLUCOSE 195 (H) 12/04/2021  ? CHOL 114 12/27/2021  ? TRIG 156 (H) 12/27/2021  ? HDL 33 (L) 12/27/2021  ? [place] 54 12/27/2021  ? ALT 23 12/04/2021  ? AST 16 12/04/2021  ? NA 135 12/04/2021  ? K 4.9 12/04/2021  ? CL 100 12/04/2021  ? CREATININE 0.84 12/04/2021  ? BUN 19 12/04/2021  ? CO2 25 12/04/2021  ? TSH 1.250 09/26/2021  ? HGBA1C 9.7 (H) 12/27/2021  ? MICROALBUR 80 09/26/2021  ? ? ? ? ?Assessment & Plan:  ? ?Problem List Items Addressed This Visit   ? ?   ? Cardiovascular and Mediastinum  ? Hypertension associated with diabetes ([place]) - Primary  ?  Control: good ?Recommend check sugars fasting daily. ?Recommend  check feet daily. ?Recommend annual eye exams. ?Medicines:  rybelsus 3 mg once daily and januvia and metformin ?Continue januvia 100 mg daily until you run out or you return for your next appointment whichever comes first. Start rybelsus 3 mg daily in am.  ?Check sugars daily and bring meter or log book with numbers to next appointment. Continue xigduo xr 01/999 mg 2 daily. ?Continue to work on eating a healthy diet and exercise.  ?Labs drawn today.   ? ?  ?  ? Relevant Orders  ? Hemoglobin A1c (Completed)  ? Lipid panel (Completed)  ?  ? Hematopoietic and Hemostatic  ? Thrombocytopenia ([place])  ?  Recheck cbc.  ?  ?  ?  ? Other  ? Insomnia  ?  Insomnia education given. ?  ?  ? Severe episode of recurrent major depressive disorder, without psychotic features ([place])  ?  Stop citalopram.  Start Trintellix 10 mg daily. ?Leave extended for work for 1 month. ?  ?  ? Class 1 obesity due to excess calories with serious comorbidity and body mass index (BMI) of 33.0 to 33.9 in adult  ?  Recommend continue to work on eating healthy diet and exercise. ? ?  ?  ?. ? ?No orders of the defined types were placed in this encounter. ? ? ?Orders Placed This Encounter  ?Procedures  ? Hemoglobin A1c  ? Lipid panel  ?  ? ?Follow-up: Return in about 3 weeks (around 01/15/2022) for chronic follow up depression. ? ?An After Visit Summary was printed and given to the patient. ? ?[name], MD ?[facility] ?[phone] ?

## 2021-12-25 NOTE — Patient Instructions (Signed)
Complete [name] 3 mg once daily.  Then increase to 7 mg daily. ?Discontinue Januvia when gone. ?Continue [name] 2 pills daily. ? ?Stop citalopram.  Start Trintellix 10 mg daily. ?Leave extended for work for 1 month. ?

## 2021-12-25 NOTE — Telephone Encounter (Signed)
Refill sent to pharmacy.   

## 2021-12-27 ENCOUNTER — Other Ambulatory Visit: Payer: Self-pay

## 2021-12-27 DIAGNOSIS — I152 Hypertension secondary to endocrine disorders: Secondary | ICD-10-CM

## 2021-12-28 LAB — LIPID PANEL
Chol/HDL Ratio: 3.5 ratio (ref 0.0–4.4)
Cholesterol, Total: 114 mg/dL (ref 100–199)
HDL: 33 mg/dL — ABNORMAL LOW (ref >39)
LDL Chol Calc (NIH): 54 mg/dL (ref 0–99)
Triglycerides: 156 mg/dL — ABNORMAL HIGH (ref 0–149)
VLDL Cholesterol Cal: 27 mg/dL (ref 5–40)

## 2021-12-28 LAB — CARDIOVASCULAR RISK ASSESSMENT

## 2021-12-28 LAB — HEMOGLOBIN A1C
Est. average glucose Bld gHb Est-mCnc: 232 mg/dL
Hgb A1c MFr Bld: 9.7 % — ABNORMAL HIGH (ref 4.8–5.6)

## 2021-12-28 NOTE — Progress Notes (Signed)
Cholesterol: Much better! Continue current treatment.  ?HBA1C: still very high at 9.7. Still improved from 10.3. ?Complete [name] 3 mg once daily.  Then increase to 7 mg daily. ?Discontinue Januvia when you run out.  ?Continue [name] 2 pills daily.  ?Recommend continue to work on eating healthy diet and exercise. ? ?

## 2021-12-29 ENCOUNTER — Encounter: Payer: Self-pay | Admitting: Family Medicine

## 2021-12-29 NOTE — Assessment & Plan Note (Signed)
Stop citalopram.  Start Trintellix 10 mg daily. ?Leave extended for work for 1 month. ?

## 2021-12-29 NOTE — Assessment & Plan Note (Signed)
Recommend continue to work on eating healthy diet and exercise.  

## 2021-12-29 NOTE — Assessment & Plan Note (Signed)
Recheck cbc.  

## 2021-12-29 NOTE — Assessment & Plan Note (Signed)
Control: good ?Recommend check sugars fasting daily. ?Recommend check feet daily. ?Recommend annual eye exams. ?Medicines:  rybelsus 3 mg once daily and januvia and metformin ?Continue januvia 100 mg daily until you run out or you return for your next appointment whichever comes first. Start rybelsus 3 mg daily in am.  ?Check sugars daily and bring meter or log book with numbers to next appointment. Continue xigduo xr 01/999 mg 2 daily. ?Continue to work on eating a healthy diet and exercise.  ?Labs drawn today.   ? ?

## 2021-12-29 NOTE — Assessment & Plan Note (Signed)
Insomnia education given.  ?

## 2022-01-03 ENCOUNTER — Other Ambulatory Visit: Payer: Self-pay

## 2022-01-03 DIAGNOSIS — I10 Essential (primary) hypertension: Secondary | ICD-10-CM

## 2022-01-03 DIAGNOSIS — E1121 Type 2 diabetes mellitus with diabetic nephropathy: Secondary | ICD-10-CM

## 2022-01-03 MED ORDER — LISINOPRIL 10 MG PO TABS: 10.0000 mg | ORAL_TABLET | Freq: Every day | ORAL | 1 refills | Status: DC

## 2022-01-14 ENCOUNTER — Ambulatory Visit: Payer: Managed Care, Other (non HMO) | Admitting: Family Medicine

## 2022-01-14 VITALS — BP 110/60 | HR 96 | Temp 97.2°F | Resp 18 | Ht 60.0 in | Wt 169.0 lb

## 2022-01-14 DIAGNOSIS — F331 Major depressive disorder, recurrent, moderate: Secondary | ICD-10-CM | POA: Diagnosis not present

## 2022-01-14 DIAGNOSIS — F5104 Psychophysiologic insomnia: Secondary | ICD-10-CM

## 2022-01-14 DIAGNOSIS — E782 Mixed hyperlipidemia: Secondary | ICD-10-CM | POA: Diagnosis not present

## 2022-01-14 DIAGNOSIS — E1169 Type 2 diabetes mellitus with other specified complication: Secondary | ICD-10-CM | POA: Diagnosis not present

## 2022-01-14 MED ORDER — RYBELSUS 7 MG PO TABS: 7.0000 mg | ORAL_TABLET | Freq: Every day | ORAL | 0 refills | Status: DC

## 2022-01-14 NOTE — Patient Instructions (Signed)
Diabetes: ?Increase rybelsus to 7 mg once daily.  ? ?Start belsomra 15 mg once daily at night.  ? ?

## 2022-01-14 NOTE — Progress Notes (Signed)
? ?Subjective:  ?Patient ID: Robin Bonilla, female    DOB: Apr 02, 1956  Age: 66 y.o. MRN: [id number] ? ?Chief Complaint  ?Patient presents with  ? Depression  ? ? ?HPI ?Depression: Patient was started on citalopram approximately 4 weeks ago however it was not helping.  After 3 weeks she still continued to feel equally depressed.  Her mother passed away in the last 2 months.  The depression preceded this however this significantly worsened her symptoms.  I started her on Trintellix 10 mg daily on December 25, 2021.  She presents for follow-up today. ? ? ?  01/14/2022  ? 11:23 AM 12/25/2021  ?  9:04 AM 12/09/2021  ?  8:52 AM  ?PHQ9 SCORE ONLY  ?PHQ-9 Total Score 10 12 22   ? ? ? ?Diabetes: Patient's diabetes was uncontrolled.  At her visit on March 29 I instruct start started [name] 3 mg once daily after she completed her Januvia which was expected to be very soon.  I was then going to increase her to 70 mg daily.  She is currently on [name] 3 mg daily.  She is still on [name] 01/999 2 pills daily. ?Her sugars 179-227.  Still poorly controlled. ? ? ?  01/14/2022  ? 11:23 AM 12/25/2021  ?  9:04 AM 12/09/2021  ?  8:52 AM  ?PHQ9 SCORE ONLY  ?PHQ-9 Total Score 10 12 22   ? ? ?No current facility-administered medications on file prior to visit.  ? ?Current Outpatient Medications on File Prior to Visit  ?Medication Sig Dispense Refill  ? ACCU-CHEK GUIDE test strip TEST STRIPS TWICE A DAY IN VITRO 30 DAYS 100 each 2  ? citalopram (CELEXA) 20 MG tablet TAKE 1 TABLET BY MOUTH EVERY DAY (Patient taking differently: Take 20 mg by mouth daily.) 90 tablet 1  ? Dapagliflozin-metFORMIN HCl ER ([name]) 01-999 MG TB24 Take 2 tablets by mouth daily. STOP METFORMIN. (Patient taking differently: Take 2 tablets by mouth daily.) 180 tablet 0  ? icosapent Ethyl (VASCEPA) 1 g capsule Take 2 capsules (2 g total) by mouth 2 (two) times daily. 360 capsule 0  ? levothyroxine (SYNTHROID) 137 MCG tablet Take 1 tablet (137 mcg total) by mouth  daily before breakfast. 90 tablet 0  ? lisinopril (ZESTRIL) 10 MG tablet Take 1 tablet (10 mg total) by mouth daily. 90 tablet 1  ? rosuvastatin (CRESTOR) 20 MG tablet Take 1 tablet (20 mg total) by mouth daily. 90 tablet 1  ? ?Past Medical History:  ?Diagnosis Date  ? Acute sinusitis   ? Depression   ? Diabetes mellitus 2005  ? Dysthymic disorder   ? Hirsutism   ? Hyperlipidemia   ? Rash, skin   ? Thyroid disease   ? ?Past Surgical History:  ?Procedure Laterality Date  ? BACK SURGERY    ? CHOLECYSTECTOMY  05/30/11  ? PARTIAL HYSTERECTOMY    ? TONSILLECTOMY    ? TUBAL LIGATION    ?  ?Family History  ?Problem Relation Age of Onset  ? Breast cancer Mother 32  ? CAD Father   ? CAD Maternal Grandmother   ? Hypertension Maternal Grandmother   ? Diabetes Paternal Grandfather   ? Diabetes Paternal Aunt   ? ?Social History  ? ?Socioeconomic History  ? Marital status: Married  ?  Spouse name: Not on file  ? Number of children: Not on file  ? Years of education: Not on file  ? Highest education level: Not on file  ?Occupational History  ?  Not on file  ?Tobacco Use  ? Smoking status: Never  ? Smokeless tobacco: Never  ?Substance and Sexual Activity  ? Alcohol use: No  ?  Alcohol/week: 0.0 standard drinks  ? Drug use: No  ? Sexual activity: Not on file  ?Other Topics Concern  ? Not on file  ?Social History Narrative  ? Not on file  ? ?Social Determinants of Health  ? ?Financial Resource Strain: Not on file  ?Food Insecurity: Not on file  ?Transportation Needs: Not on file  ?Physical Activity: Not on file  ?Stress: Not on file  ?Social Connections: Not on file  ? ? ?Review of Systems  ?Constitutional:  Positive for fatigue. Negative for chills and fever.  ?HENT:  Negative for congestion, rhinorrhea and sore throat.   ?Respiratory:  Negative for cough and shortness of breath.   ?Cardiovascular:  Negative for chest pain.  ?Gastrointestinal:  Negative for abdominal pain, constipation, diarrhea, nausea and vomiting.  ?Genitourinary:   Negative for dysuria and urgency.  ?Musculoskeletal:  Negative for back pain and myalgias.  ?Neurological:  Negative for dizziness, weakness, light-headedness and headaches.  ?Psychiatric/Behavioral:  Positive for dysphoric mood. The patient is not nervous/anxious.   ? ? ?Objective:  ?BP 110/60   Pulse 96   Temp (!) 97.2 ?F (36.2 ?C)   Resp 18   Ht 5' (1.524 m)   Wt 169 lb (76.7 kg)   BMI 33.01 kg/m?  ? ? ?  01/26/2022  ?  9:00 AM 01/26/2022  ?  6:06 AM 01/26/2022  ?  3:20 AM  ?BP/Weight  ?Systolic BP 110 110 112  ?Diastolic BP 72 65 72  ? ? ?Physical Exam ?Vitals reviewed.  ?Constitutional:   ?   Appearance: Normal appearance.  ?Neck:  ?   Vascular: No carotid bruit.  ?Cardiovascular:  ?   Rate and Rhythm: Normal rate and regular rhythm.  ?   Heart sounds: Normal heart sounds.  ?Pulmonary:  ?   Effort: Pulmonary effort is normal. No respiratory distress.  ?   Breath sounds: Normal breath sounds.  ?Abdominal:  ?   General: Abdomen is flat. Bowel sounds are normal.  ?   Palpations: Abdomen is soft.  ?   Tenderness: There is no abdominal tenderness.  ?Neurological:  ?   Mental Status: She is alert and oriented to person, place, and time.  ?Psychiatric:     ?   Mood and Affect: Mood normal.     ?   Behavior: Behavior normal.  ? ? ?Diabetic Foot Exam - Simple   ?No data filed ?  ?  ? ?Lab Results  ?Component Value Date  ? WBC 12.8 (H) 01/25/2022  ? HGB 13.4 01/25/2022  ? HCT 39.3 01/25/2022  ? PLT 185 01/25/2022  ? GLUCOSE 221 (H) 01/25/2022  ? CHOL 114 12/27/2021  ? TRIG 156 (H) 12/27/2021  ? HDL 33 (L) 12/27/2021  ? LDLCALC 54 12/27/2021  ? ALT 23 01/25/2022  ? AST 17 01/25/2022  ? NA 138 01/25/2022  ? K 3.9 01/25/2022  ? CL 102 01/25/2022  ? CREATININE 0.75 01/25/2022  ? BUN 11 01/25/2022  ? CO2 25 01/25/2022  ? TSH 1.250 09/26/2021  ? HGBA1C 9.7 (H) 12/27/2021  ? MICROALBUR 80 09/26/2021  ? ? ? ? ?Assessment & Plan:  ? ?Problem List Items Addressed This Visit   ? ?  ? Endocrine  ? Mixed diabetic hyperlipidemia  associated with type 2 diabetes mellitus ([facility])  ?  Increase rybelsus  to 7 mg once daily.  ? ?  ?  ? Relevant Medications  ? Semaglutide ([name]) 7 MG TABS  ?  ? Other  ? Psychophysiological insomnia  ?  Start belsomra 15 mg before bed. Samples given.  ? ?  ?  ? Moderate recurrent major depression ([facility]) - Primary  ?  Continue trintellix 10 mg daily.  ?Start belsomra for sleep. Samples given.  ? ?  ?  ?. ? ?Meds ordered this encounter  ?Medications  ? DISCONTD: Semaglutide ([name]) 7 MG TABS  ?  Sig: Take 7 mg by mouth daily.  ?  Dispense:  90 tablet  ?  Refill:  0  ? Semaglutide ([name]) 7 MG TABS  ?  Sig: Take 7 mg by mouth daily.  ?  Dispense:  30 tablet  ?  Refill:  0  ? ? ?No orders of the defined types were placed in this encounter. ?  ? ?Follow-up: Return in about 4 weeks (around 02/11/2022). ? ?An After Visit Summary was printed and given to the patient. ? ?Blane OharaKirsten Arisa Congleton, MD ?[facility] ?[phone] ?

## 2022-01-22 ENCOUNTER — Encounter: Payer: Self-pay | Admitting: Family Medicine

## 2022-01-22 ENCOUNTER — Telehealth: Payer: Self-pay

## 2022-01-22 NOTE — Telephone Encounter (Signed)
Mr. Malley was notified that the letter would be ready by this afternoon. ? ?Mr. Romanchuk stated that she needs the letter for her to work 32 hours per week. ? ?Dr. Tobie Poet notified ?

## 2022-01-22 NOTE — Telephone Encounter (Signed)
Dr. Tobie Poet, ?Patients husband called this morning for his wife. Patient is requesting a return to work letter for this coming Monday 01/27/2022. ? ?

## 2022-01-25 ENCOUNTER — Inpatient Hospital Stay (HOSPITAL_COMMUNITY)
Admission: EM | Admit: 2022-01-25 | Discharge: 2022-01-29 | DRG: 392 | Disposition: A | Discharge status: Home / Self Care | Payer: Managed Care, Other (non HMO) | Attending: Internal Medicine | Admitting: Internal Medicine

## 2022-01-25 ENCOUNTER — Other Ambulatory Visit: Payer: Self-pay

## 2022-01-25 ENCOUNTER — Encounter (HOSPITAL_COMMUNITY): Payer: Self-pay

## 2022-01-25 ENCOUNTER — Emergency Department (HOSPITAL_COMMUNITY): Payer: Managed Care, Other (non HMO)

## 2022-01-25 DIAGNOSIS — K5792 Diverticulitis of intestine, part unspecified, without perforation or abscess without bleeding: Principal | ICD-10-CM | POA: Diagnosis present

## 2022-01-25 DIAGNOSIS — I959 Hypotension, unspecified: Secondary | ICD-10-CM | POA: Diagnosis present

## 2022-01-25 DIAGNOSIS — E669 Obesity, unspecified: Secondary | ICD-10-CM | POA: Diagnosis present

## 2022-01-25 DIAGNOSIS — E782 Mixed hyperlipidemia: Secondary | ICD-10-CM | POA: Diagnosis present

## 2022-01-25 DIAGNOSIS — Z794 Long term (current) use of insulin: Secondary | ICD-10-CM | POA: Diagnosis present

## 2022-01-25 DIAGNOSIS — F32A Depression, unspecified: Secondary | ICD-10-CM | POA: Diagnosis present

## 2022-01-25 DIAGNOSIS — Z833 Family history of diabetes mellitus: Secondary | ICD-10-CM | POA: Diagnosis not present

## 2022-01-25 DIAGNOSIS — Z888 Allergy status to other drugs, medicaments and biological substances status: Secondary | ICD-10-CM | POA: Diagnosis not present

## 2022-01-25 DIAGNOSIS — E1169 Type 2 diabetes mellitus with other specified complication: Secondary | ICD-10-CM | POA: Diagnosis present

## 2022-01-25 DIAGNOSIS — E039 Hypothyroidism, unspecified: Secondary | ICD-10-CM | POA: Diagnosis present

## 2022-01-25 DIAGNOSIS — Z79899 Other long term (current) drug therapy: Secondary | ICD-10-CM | POA: Diagnosis not present

## 2022-01-25 DIAGNOSIS — Z79890 Hormone replacement therapy (postmenopausal): Secondary | ICD-10-CM | POA: Diagnosis not present

## 2022-01-25 DIAGNOSIS — Z6833 Body mass index (BMI) 33.0-33.9, adult: Secondary | ICD-10-CM

## 2022-01-25 DIAGNOSIS — Z8719 Personal history of other diseases of the digestive system: Secondary | ICD-10-CM | POA: Diagnosis not present

## 2022-01-25 DIAGNOSIS — E1165 Type 2 diabetes mellitus with hyperglycemia: Secondary | ICD-10-CM | POA: Diagnosis present

## 2022-01-25 DIAGNOSIS — I10 Essential (primary) hypertension: Secondary | ICD-10-CM | POA: Diagnosis present

## 2022-01-25 DIAGNOSIS — Z9851 Tubal ligation status: Secondary | ICD-10-CM | POA: Diagnosis not present

## 2022-01-25 DIAGNOSIS — Z803 Family history of malignant neoplasm of breast: Secondary | ICD-10-CM

## 2022-01-25 DIAGNOSIS — Z880 Allergy status to penicillin: Secondary | ICD-10-CM | POA: Diagnosis not present

## 2022-01-25 DIAGNOSIS — E1159 Type 2 diabetes mellitus with other circulatory complications: Secondary | ICD-10-CM | POA: Diagnosis not present

## 2022-01-25 DIAGNOSIS — R6510 Systemic inflammatory response syndrome (SIRS) of non-infectious origin without acute organ dysfunction: Secondary | ICD-10-CM | POA: Diagnosis present

## 2022-01-25 DIAGNOSIS — Z8249 Family history of ischemic heart disease and other diseases of the circulatory system: Secondary | ICD-10-CM

## 2022-01-25 DIAGNOSIS — Z7984 Long term (current) use of oral hypoglycemic drugs: Secondary | ICD-10-CM

## 2022-01-25 DIAGNOSIS — I152 Hypertension secondary to endocrine disorders: Secondary | ICD-10-CM | POA: Diagnosis present

## 2022-01-25 LAB — BASIC METABOLIC PANEL
Anion gap: 11 (ref 5–15)
BUN: 11 mg/dL (ref 8–23)
CO2: 25 mmol/L (ref 22–32)
Calcium: 9.1 mg/dL (ref 8.9–10.3)
Chloride: 102 mmol/L (ref 98–111)
Creatinine, Ser: 0.75 mg/dL (ref 0.44–1.00)
GFR, Estimated: >60 mL/min (ref >60)
Glucose, Bld: 221 mg/dL — ABNORMAL HIGH (ref 70–99)
Potassium: 3.9 mmol/L (ref 3.5–5.1)
Sodium: 138 mmol/L (ref 135–145)

## 2022-01-25 LAB — URINALYSIS, MICROSCOPIC (REFLEX): RBC / HPF: NONE SEEN RBC/hpf (ref 0–5)

## 2022-01-25 LAB — CBC
HCT: 42.3 % (ref 36.0–46.0)
HCT: 39.3 % (ref 36.0–46.0)
Hemoglobin: 14.3 g/dL (ref 12.0–15.0)
Hemoglobin: 13.4 g/dL (ref 12.0–15.0)
MCH: 27.4 pg (ref 26.0–34.0)
MCH: 27.5 pg (ref 26.0–34.0)
MCHC: 33.8 g/dL (ref 30.0–36.0)
MCHC: 34.1 g/dL (ref 30.0–36.0)
MCV: 81 fL (ref 80.0–100.0)
MCV: 80.7 fL (ref 80.0–100.0)
Platelets: 248 10*3/uL (ref 150–400)
Platelets: 185 10*3/uL (ref 150–400)
RBC: 5.22 MIL/uL — ABNORMAL HIGH (ref 3.87–5.11)
RBC: 4.87 MIL/uL (ref 3.87–5.11)
RDW: 13.9 % (ref 11.5–15.5)
RDW: 13.9 % (ref 11.5–15.5)
WBC: 13.4 10*3/uL — ABNORMAL HIGH (ref 4.0–10.5)
WBC: 12.8 10*3/uL — ABNORMAL HIGH (ref 4.0–10.5)
nRBC: 0 % (ref 0.0–0.2)
nRBC: 0 % (ref 0.0–0.2)

## 2022-01-25 LAB — URINALYSIS, ROUTINE W REFLEX MICROSCOPIC
Bilirubin Urine: NEGATIVE
Glucose, UA: >=500 mg/dL — AB
Ketones, ur: 15 mg/dL — AB
Leukocytes,Ua: NEGATIVE
Nitrite: POSITIVE — AB
Protein, ur: NEGATIVE mg/dL
Specific Gravity, Urine: 1.015 (ref 1.005–1.030)
pH: 5.5 (ref 5.0–8.0)

## 2022-01-25 LAB — COMPREHENSIVE METABOLIC PANEL
ALT: 23 U/L (ref 0–44)
AST: 17 U/L (ref 15–41)
Albumin: 3.8 g/dL (ref 3.5–5.0)
Alkaline Phosphatase: 74 U/L (ref 38–126)
Anion gap: 9 (ref 5–15)
BUN: 11 mg/dL (ref 8–23)
CO2: 26 mmol/L (ref 22–32)
Calcium: 9.5 mg/dL (ref 8.9–10.3)
Chloride: 103 mmol/L (ref 98–111)
Creatinine, Ser: 0.83 mg/dL (ref 0.44–1.00)
GFR, Estimated: >60 mL/min (ref >60)
Glucose, Bld: 263 mg/dL — ABNORMAL HIGH (ref 70–99)
Potassium: 4.4 mmol/L (ref 3.5–5.1)
Sodium: 138 mmol/L (ref 135–145)
Total Bilirubin: 1.8 mg/dL — ABNORMAL HIGH (ref 0.3–1.2)
Total Protein: 7.3 g/dL (ref 6.5–8.1)

## 2022-01-25 LAB — LACTIC ACID, PLASMA: Lactic Acid, Venous: 0.9 mmol/L (ref 0.5–1.9)

## 2022-01-25 LAB — LIPASE, BLOOD: Lipase: 31 U/L (ref 11–51)

## 2022-01-25 LAB — GLUCOSE, CAPILLARY: Glucose-Capillary: 212 mg/dL — ABNORMAL HIGH (ref 70–99)

## 2022-01-25 MED ORDER — ALBUTEROL SULFATE (2.5 MG/3ML) 0.083% IN NEBU: 2.5000 mg | INHALATION_SOLUTION | Freq: Four times a day (QID) | RESPIRATORY_TRACT | Status: DC | PRN

## 2022-01-25 MED ORDER — HYDROCODONE-ACETAMINOPHEN 5-325 MG PO TABS
1.0000 | ORAL_TABLET | Freq: Four times a day (QID) | ORAL | Status: DC | PRN
  Administered 2022-01-25 – 2022-01-27 (×5): 1 via ORAL
  Filled 2022-01-25 (×5): qty 1

## 2022-01-25 MED ORDER — ONDANSETRON HCL 4 MG/2ML IJ SOLN: 4.0000 mg | Freq: Four times a day (QID) | INTRAMUSCULAR | Status: DC | PRN

## 2022-01-25 MED ORDER — INSULIN ASPART 100 UNIT/ML IJ SOLN
0.0000 [IU] | Freq: Four times a day (QID) | INTRAMUSCULAR | Status: DC
  Administered 2022-01-25 – 2022-01-26 (×2): 5 [IU] via SUBCUTANEOUS
  Administered 2022-01-26 (×2): 3 [IU] via SUBCUTANEOUS
  Administered 2022-01-26: 8 [IU] via SUBCUTANEOUS
  Administered 2022-01-27: 3 [IU] via SUBCUTANEOUS
  Administered 2022-01-27: 5 [IU] via SUBCUTANEOUS

## 2022-01-25 MED ORDER — CITALOPRAM HYDROBROMIDE 20 MG PO TABS
20.0000 mg | ORAL_TABLET | Freq: Every day | ORAL | Status: DC
  Administered 2022-01-25 – 2022-01-29 (×5): 20 mg via ORAL
  Filled 2022-01-25 (×5): qty 1

## 2022-01-25 MED ORDER — ONDANSETRON HCL 4 MG PO TABS
4.0000 mg | ORAL_TABLET | Freq: Four times a day (QID) | ORAL | Status: DC | PRN
Start: 2022-01-25 — End: 2022-01-29

## 2022-01-25 MED ORDER — METRONIDAZOLE 500 MG/100ML IV SOLN
500.0000 mg | Freq: Two times a day (BID) | INTRAVENOUS | Status: DC
  Administered 2022-01-25 – 2022-01-29 (×8): 500 mg via INTRAVENOUS
  Filled 2022-01-25 (×8): qty 100

## 2022-01-25 MED ORDER — METRONIDAZOLE 500 MG/100ML IV SOLN
500.0000 mg | Freq: Once | INTRAVENOUS | Status: AC
  Administered 2022-01-25: 500 mg via INTRAVENOUS
  Filled 2022-01-25: qty 100

## 2022-01-25 MED ORDER — IOHEXOL 300 MG/ML  SOLN
100.0000 mL | Freq: Once | INTRAMUSCULAR | Status: AC | PRN
  Administered 2022-01-25: 100 mL via INTRAVENOUS

## 2022-01-25 MED ORDER — SODIUM CHLORIDE 0.9% FLUSH
3.0000 mL | Freq: Two times a day (BID) | INTRAVENOUS | Status: DC
  Administered 2022-01-25 – 2022-01-27 (×3): 3 mL via INTRAVENOUS

## 2022-01-25 MED ORDER — MORPHINE SULFATE (PF) 2 MG/ML IV SOLN: 2.0000 mg | INTRAVENOUS | Status: DC | PRN

## 2022-01-25 MED ORDER — ROSUVASTATIN CALCIUM 20 MG PO TABS
20.0000 mg | ORAL_TABLET | Freq: Every day | ORAL | Status: DC
  Administered 2022-01-25 – 2022-01-29 (×5): 20 mg via ORAL
  Filled 2022-01-25 (×5): qty 1

## 2022-01-25 MED ORDER — SODIUM CHLORIDE 0.9 % IV SOLN
2.0000 g | INTRAVENOUS | Status: DC
  Administered 2022-01-26 – 2022-01-28 (×3): 2 g via INTRAVENOUS
  Filled 2022-01-25 (×3): qty 20

## 2022-01-25 MED ORDER — LEVOTHYROXINE SODIUM 25 MCG PO TABS
137.0000 ug | ORAL_TABLET | Freq: Every day | ORAL | Status: DC
  Administered 2022-01-26 – 2022-01-29 (×4): 137 ug via ORAL
  Filled 2022-01-25 (×4): qty 1

## 2022-01-25 MED ORDER — ENOXAPARIN SODIUM 40 MG/0.4ML IJ SOSY
40.0000 mg | PREFILLED_SYRINGE | INTRAMUSCULAR | Status: DC
  Administered 2022-01-25 – 2022-01-27 (×3): 40 mg via SUBCUTANEOUS
  Filled 2022-01-25 (×4): qty 0.4

## 2022-01-25 MED ORDER — SODIUM CHLORIDE 0.9 % IV SOLN
2.0000 g | Freq: Once | INTRAVENOUS | Status: AC
  Administered 2022-01-25: 2 g via INTRAVENOUS
  Filled 2022-01-25: qty 20

## 2022-01-25 MED ORDER — SODIUM CHLORIDE 0.9 % IV SOLN: INTRAVENOUS | Status: DC

## 2022-01-25 MED ORDER — LISINOPRIL 10 MG PO TABS
10.0000 mg | ORAL_TABLET | Freq: Every day | ORAL | Status: DC
  Administered 2022-01-25 – 2022-01-26 (×2): 10 mg via ORAL
  Filled 2022-01-25 (×2): qty 1

## 2022-01-25 MED ORDER — DEXTROSE IN LACTATED RINGERS 5 % IV SOLN: INTRAVENOUS | Status: DC

## 2022-01-25 NOTE — ED Triage Notes (Signed)
Patient sent from [place] for further evaluation of LLQ pain since last night, nausea with same. MD at [facility] reports WBC 13000, patient alert and oriented, pain only with movement ?

## 2022-01-25 NOTE — ED Provider Notes (Signed)
Transfer of Care Note ?I assumed care of Robin Bonilla on 01/25/2022 at @NOWNR @. ? ?Briefly, Robin Bonilla is a 66 y.o. female who: ?- LLQ pain  ?- UA positive for nitrite, Leuk positive   ? ?  ?CT ABDOMEN PELVIS W CONTRAST  ?Final Result  ?  ? ?The plan includes: ?-f/u with CT scan  ? ? ?Please refer to the original provider?s note for additional information regarding the care of 79. ? ?### ?Reassessment: ?I personally reassessed the patient: ? ?-Patient continued to have severe abdominal pain.  She is tender around the suprapubic region.  ? ?Today's Vitals  ? 01/25/22 2030 01/25/22 2130 01/25/22 2222 01/25/22 2337  ?BP:    (!) 96/54  ?Pulse:    89  ?Resp:    18  ?Temp: 99 ?F (37.2 ?C)   98.4 ?F (36.9 ?C)  ?TempSrc: Oral   Oral  ?SpO2:    92%  ?Height:    5' (1.524 m)  ?PainSc:  7  Asleep   ? ?Body mass index is 33.01 kg/m?. ? ?Additional MDM: ? ?-Patient CT scan is concerning for diverticulitis with phlegmon change.  Based on her history and exam patient presentation is concerning for complicated diverticulitis.  She has been given Rocephin and metronidazole.  I will start her on maintenance fluid.  All pertinent information has been communicated with admitting team. ? ?Dispo: Admitted  ?  ?02-24-1993, MD ?01/25/22 2354 ? ?  ?01/27/22, MD ?01/26/22 2324 ? ?

## 2022-01-25 NOTE — H&P (Signed)
?History and Physical  ? ? ?Patient: Robin Bonilla ZOX:[id number] DOB: [birth year] ?DOA: 01/25/2022 ?DOS: the patient was seen and examined on 01/25/2022 ?PCP: [name], MD  ?Patient coming from: Home ? ?Chief Complaint: Abdominal pain ? ?HPI: Robin Bonilla is a 66 y.o. female with medical history significant of hyperlipidemia, diabetes mellitus type 2, hypothyroidism, and depression who presents with complaints of left lower quadrant abdominal pain since yesterday.  Pain is described as a burning in nature and severe.  Denies having any significant nausea, vomiting, diarrhea, or blood in stools to her knowledge.  Records note patient's last colonoscopy was in 2018.  Study revealed diverticulosis of the sigmoid colon and a 2 mm sessile polyp which was removed by Dr. Nehemiah Settle. ? ?Upon admission to the emergency department patient was noted to be febrile up to 100.1 ?F with heart rates 94-1 05, and all other vital signs maintained.  Labs significant for WBC 13.4 and initial lactic acid 0.9. CT scan showed acute diverticulitis of the proximal sigmoid colon with no perforation with 18 mm area in the sigmoid colon concerning for phlegmon. Patient started on Rocephin and Metronidazole IV. ? ?Review of Systems: As mentioned in the history of present illness. All other systems reviewed and are negative. ?Past Medical History:  ?Diagnosis Date  ? Acute sinusitis   ? Depression   ? Diabetes mellitus 2005  ? Dysthymic disorder   ? Hirsutism   ? Hyperlipidemia   ? Rash, skin   ? Thyroid disease   ? ?Past Surgical History:  ?Procedure Laterality Date  ? BACK SURGERY    ? CHOLECYSTECTOMY  05/30/11  ? PARTIAL HYSTERECTOMY    ? TONSILLECTOMY    ? TUBAL LIGATION    ? ?Social History:  reports that she has never smoked. She has never used smokeless tobacco. She reports that she does not drink alcohol and does not use drugs. ? ?Allergies  ?Allergen Reactions  ? Lunesta [Eszopiclone]   ?  Fatigue.   ? Penicillins  Rash  ? Simvastatin Other (See Comments)  ?  nightmares   ? ? ?Family History  ?Problem Relation Age of Onset  ? Breast cancer Mother 41  ? CAD Father   ? CAD Maternal Grandmother   ? Hypertension Maternal Grandmother   ? Diabetes Paternal Grandfather   ? Diabetes Paternal Aunt   ? ? ?Prior to Admission medications   ?Medication Sig Start Date End Date Taking? Authorizing Provider  ?ACCU-CHEK GUIDE test strip TEST STRIPS TWICE A DAY IN VITRO 30 DAYS 08/09/20   Rip Harbour, NP  ?Accu-Chek Softclix Lancets lancets 3 (three) times daily. 01/09/20   [provider]  ?Blood Glucose Monitoring Suppl (ACCU-CHEK GUIDE) w/Device KIT daily. use as directed 12/07/19   [provider]  ?citalopram (CELEXA) 20 MG tablet TAKE 1 TABLET BY MOUTH EVERY DAY 12/25/21   Cox, Elnita Maxwell, MD  ?Dapagliflozin-metFORMIN HCl ER ([name]) 01-999 MG TB24 Take 2 tablets by mouth daily. STOP METFORMIN. 12/02/21   CoxElnita Maxwell, MD  ?icosapent Ethyl (VASCEPA) 1 g capsule Take 2 capsules (2 g total) by mouth 2 (two) times daily. 09/27/21   [name], MD  ?levothyroxine (SYNTHROID) 137 MCG tablet Take 1 tablet (137 mcg total) by mouth daily before breakfast. 10/01/21   Cox, Kirsten, MD  ?lisinopril (ZESTRIL) 10 MG tablet Take 1 tablet (10 mg total) by mouth daily. 01/03/22   Cox, Elnita Maxwell, MD  ?rosuvastatin (CRESTOR) 20 MG tablet Take 1 tablet (20 mg  total) by mouth daily. 09/27/21   Cox, Elnita Maxwell, MD  ?Semaglutide ([name]) 7 MG TABS Take 7 mg by mouth daily. 01/14/22   [name], MD  ? ? ?Physical Exam: ?Vitals:  ? 01/25/22 1305 01/25/22 1430 01/25/22 1545 01/25/22 1600  ?BP: 118/79 125/71 137/74 124/72  ?Pulse: 94 (!) 105 (!) 102 (!) 102  ?Resp: 18   16  ?Temp: 100.1 ?F (37.8 ?C)     ?TempSrc: Oral     ?SpO2: 98% 92% 94% 94%  ? ? ? ?Constitutional: Elderly female NAD, calm, comfortable ?Eyes: PERRL, lids and conjunctivae normal ?ENMT: Mucous membranes are moist. Posterior pharynx clear of any exudate or lesions.  ?Neck: normal,  supple, no masses, no thyromegaly ?Respiratory: clear to auscultation bilaterally, no wheezing, no crackles. Normal respiratory effort.  ?Cardiovascular: Regular rate and rhythm, no murmurs / rubs / gallops. No extremity edema. 2+ pedal pulses. No carotid bruits.  ?Abdomen:  tenderness to palpation of the left lower quadrant with some mild guarding. ?Musculoskeletal: no clubbing / cyanosis. No joint deformity upper and lower extremities.  ?Skin: no rashes, lesions, ulcers. No induration ?Neurologic: CN 2-12 grossly intact. Sensation intact, DTR normal. Strength 5/5 in all 4.  ?Psychiatric: Normal judgment and insight. Alert and oriented x 3. Normal mood.  ? ?Data Reviewed: ? ? ? ?Assessment and Plan: ?Diverticulitis with phlegmon ?Acute.  Patient present with complaints of left lower quadrant abdominal pain starting yesterday evening.  Denied having any significant nausea, vomiting, or diarrhea symptoms. ?-Admit to a medical telemetry bed ?-N.p.o. except for ice chips.  Consider advancing diet tomorrow ?-Continue Rocephin and metronidazole IV ?-Hydrocodone/morphine IV as needed for moderate to severe pain respectively ?-Normal saline IV fluids at 75 ml/h ?-Will need to follow-up with gastroenterology in the outpatient setting once diverticulitis resolves ? ?SIRS ?Acute.  Patient was noted to be tachycardic with elevated white blood cell count of 13.4.  Lactic acid once obtained was noted to be within normal limits at 0.9. ?-Follow-up blood cultures ?-Recheck CBC tomorrow ? ?Abnormal urinalysis ?Acute.  Urinalysis was positive for glucose, ketones, positive nitrites, and few bacteria. ?-Check urine culture ?-Continue antibiotics as noted above ? ?Diabetes mellitus type 2, uncontrolled ?Last hemoglobin A1c 9.7 on 12/27/2021. Home med regimen include, dapagliflozin-metformin 01-999 mg and semaglutide 7 mg daily. ?-Hypoglycemic protocols ?-Hold home medication regimen ?-CBGs every 6 hours with moderate SSI ? ?Essential  hypertension ?-Continue lisinopril ? ?Hypothyroidism ?-Continue levothyroxine ? ?Hyperlipidemia ?-Continue Crestor ? ?Depression ?-Continue Celexa ? ?Advance Care Planning:   Code Status: Full Code  ? ?Consults: none ? ?Family Communication: husband updated at bedside ? ?Severity of Illness: ?The appropriate patient status for this patient is OBSERVATION. Observation status is judged to be reasonable and necessary in order to provide the required intensity of service to ensure the patient's safety. The patient's presenting symptoms, physical exam findings, and initial radiographic and laboratory data in the context of their medical condition is felt to place them at decreased risk for further clinical deterioration. Furthermore, it is anticipated that the patient will be medically stable for discharge from the hospital within 2 midnights of admission.  ? ?Author: ?Norval Morton, MD ?01/25/2022 4:19 PM ? ?For on call review [email or web address].  ?

## 2022-01-25 NOTE — ED Provider Notes (Signed)
?[facility] ?Provider Note ? ? ?CSN: [id number] ?Arrival date & time: 01/25/22  1252 ? ?  ? ?History ? ?No chief complaint on file. ? ? ?Robin Bonilla is a 66 y.o. female. ? ?Pt complains of pain in her left lower abdomen.  Pt reports pain has increased in intensity today.  Pain started 2 days ago.  Pt was seen at urgent care and was sent here for evaltuion of diverticulitis.  Patient reports that she has had a fever she denies any chills patient denies diarrhea no bloody stools patient denies any current vomiting however she is nauseated.  Patient reports she had a colonoscopy several years ago.  Patient states that they did not see any signs of diverticulitis on her last colonoscopy patient has a past medical history of hypertension and diabetes ? ?The history is provided by the patient and the spouse. No language interpreter was used.  ?Abdominal Pain ?Pain location:  LLQ ?Pain quality: aching   ?Pain radiates to:  Does not radiate ?Pain severity:  Moderate ?Onset quality:  Gradual ?Timing:  Constant ?Progression:  Worsening ?Context: not alcohol use   ?Relieved by:  Nothing ?Worsened by:  Nothing ? ?  ? ?Home Medications ?Prior to Admission medications   ?Medication Sig Start Date End Date Taking? Authorizing Provider  ?ACCU-CHEK GUIDE test strip TEST STRIPS TWICE A DAY IN VITRO 30 DAYS 08/09/20   Rip Harbour, NP  ?Accu-Chek Softclix Lancets lancets 3 (three) times daily. 01/09/20   [provider]  ?Blood Glucose Monitoring Suppl (ACCU-CHEK GUIDE) w/Device KIT daily. use as directed 12/07/19   [provider]  ?citalopram (CELEXA) 20 MG tablet TAKE 1 TABLET BY MOUTH EVERY DAY 12/25/21   Cox, Elnita Maxwell, MD  ?Dapagliflozin-metFORMIN HCl ER ([name]) 01-999 MG TB24 Take 2 tablets by mouth daily. STOP METFORMIN. 12/02/21   CoxElnita Maxwell, MD  ?icosapent Ethyl (VASCEPA) 1 g capsule Take 2 capsules (2 g total) by mouth 2 (two) times daily. 09/27/21   [name], MD  ?levothyroxine (SYNTHROID) 137 MCG tablet Take 1 tablet (137 mcg total) by mouth daily before breakfast. 10/01/21   Cox, Kirsten, MD  ?lisinopril (ZESTRIL) 10 MG tablet Take 1 tablet (10 mg total) by mouth daily. 01/03/22   CoxElnita Maxwell, MD  ?rosuvastatin (CRESTOR) 20 MG tablet Take 1 tablet (20 mg total) by mouth daily. 09/27/21   Cox, Elnita Maxwell, MD  ?Semaglutide ([name]) 7 MG TABS Take 7 mg by mouth daily. 01/14/22   [name], MD  ?   ? ?Allergies    ?Lunesta [eszopiclone], Penicillins, and Simvastatin   ? ?Review of Systems   ?Review of Systems  ?Gastrointestinal:  Positive for abdominal pain.  ?All other systems reviewed and are negative. ? ?Physical Exam ?Updated Vital Signs ?BP 125/71   Pulse (!) 105   Temp 100.1 ?F (37.8 ?C) (Oral)   Resp 18   SpO2 92%  ?Physical Exam ?Vitals and nursing note reviewed.  ?Constitutional:   ?   Appearance: She is well-developed.  ?HENT:  ?   Head: Normocephalic.  ?   Mouth/Throat:  ?   Mouth: Mucous membranes are moist.  ?Eyes:  ?   Pupils: Pupils are equal, round, and reactive to light.  ?Cardiovascular:  ?   Rate and Rhythm: Normal rate and regular rhythm.  ?Pulmonary:  ?   Effort: Pulmonary effort is normal.  ?Abdominal:  ?   General: Abdomen is flat. There is no distension.  ?  Tenderness: There is abdominal tenderness.  ?Musculoskeletal:     ?   General: Normal range of motion.  ?   Cervical back: Normal range of motion.  ?Skin: ?   General: Skin is warm.  ?Neurological:  ?   General: No focal deficit present.  ?   Mental Status: She is alert and oriented to person, place, and time.  ? ? ?ED Results / Procedures / Treatments   ?Labs ?(all labs ordered are listed, but only abnormal results are displayed) ?Labs Reviewed  ?COMPREHENSIVE METABOLIC PANEL - Abnormal; Notable for the following components:  ?    Result Value  ? Glucose, Bld 263 (*)   ? Total Bilirubin 1.8 (*)   ? All other components within normal limits  ?CBC - Abnormal; Notable for the following  components:  ? WBC 13.4 (*)   ? RBC 5.22 (*)   ? All other components within normal limits  ?URINALYSIS, ROUTINE W REFLEX MICROSCOPIC - Abnormal; Notable for the following components:  ? APPearance HAZY (*)   ? Glucose, UA >=500 (*)   ? Hgb urine dipstick TRACE (*)   ? Ketones, ur 15 (*)   ? Nitrite POSITIVE (*)   ? All other components within normal limits  ?URINALYSIS, MICROSCOPIC (REFLEX) - Abnormal; Notable for the following components:  ? Bacteria, UA FEW (*)   ? All other components within normal limits  ?LIPASE, BLOOD  ? ? ?EKG ?None ? ?Radiology ?No results found. ? ?Procedures ?Procedures  ? ? ?Medications Ordered in ED ?Medications - No data to display ? ?ED Course/ Medical Decision Making/ A&P ?  ?                        ?Medical Decision Making ?Amount and/or Complexity of Data Reviewed ?Independent Historian: spouse ?   Details: Patient is here with her husband is supportive. ?External Data Reviewed: notes. ?   Details: Very care notes reviewed ?Labs: ordered. Decision-making details documented in ED Course. ?   Details: Labs ordered reviewed and interpreted patient has an elevated glucose of 263 bilirubin is slightly elevated at 1.8 patient has a white blood cell count of 13.4 urinalysis is positive for nitrates ?Radiology: ordered and independent interpretation performed. Decision-making details documented in ED Course. ?   Details: CT abdomen and pelvis ordered CT results are pending ?Discussion of management or test interpretation with external provider(s): Patient receiving IV antibiotics for probable diverticulitis patient's care turned over to Dr. Vivianne Spence with CT pending. ? ?Risk ?Prescription drug management. ?Decision regarding hospitalization. ? ? ? ? ? ? ? ? ? ? ?Final Clinical Impression(s) / ED Diagnoses ?Final diagnoses:  ?Diverticulitis  ? ? ?Rx / DC Orders ?ED Discharge Orders   ? ? None  ? ?  ? ? ?  ?Fransico Meadow, [place] ?01/26/22 1733 ? ?  ?Isla Pence, MD ?01/29/22 [phone] ? ?

## 2022-01-25 NOTE — ED Notes (Signed)
Patient transported to CT 

## 2022-01-25 NOTE — ED Notes (Signed)
ED Provider at bedside. 

## 2022-01-25 NOTE — TOC Initial Note (Signed)
Transition of Care (TOC) - Initial/Assessment Note  ? ? ?Patient Details  ?Name: Robin Bonilla ?MRN: [id number] ?Date of Birth: 1955-12-20 ? ?Transition of Care (TOC) CM/SW Contact:    ?Lockie Pares, RN ?Phone Number: ?01/25/2022, 5:11 PM ? ?Clinical Narrative:                 ? ?Transition of Care Department [facility]) has reviewed patient and no TOC needs have been identified at this time. We will continue to monitor patient advancement through interdisciplinary progression rounds. If new patient transition needs arise, please place a TOC consult. ?  ?  ?  ?  ? ? ?Patient Goals and CMS Choice ?  ?  ?  ? ?Expected Discharge Plan and Services ?  ?  ?  ?  ?  ?                ?  ?  ?  ?  ?  ?  ?  ?  ?  ?  ? ?Prior Living Arrangements/Services ?  ?  ?  ?       ?  ?  ?  ?  ? ?Activities of Daily Living ?  ?  ? ?Permission Sought/Granted ?  ?  ?   ?   ?   ?   ? ?Emotional Assessment ?  ?  ?  ?  ?  ?  ? ?Admission diagnosis:  Diverticulitis [K57.92] ?Patient Active Problem List  ? Diagnosis Date Noted  ? Diverticulitis 01/25/2022  ? Class 1 obesity due to excess calories with serious comorbidity and body mass index (BMI) of 33.0 to 33.9 in adult 12/20/2021  ? Severe episode of recurrent major depressive disorder, without psychotic features (HCC) 12/16/2021  ? Vaginal bleeding 09/14/2021  ? Hypertension associated with diabetes (HCC) 09/14/2021  ? Thrombocytopenia (HCC) 09/14/2021  ? Insomnia 09/14/2021  ? Mixed diabetic hyperlipidemia associated with type 2 diabetes mellitus (HCC) 09/07/2021  ? Hypothyroidism (acquired) 02/05/2021  ? Hyperglycemia due to type 2 diabetes mellitus (HCC) 02/05/2021  ? Recurrent major depression-severe (HCC) 02/05/2021  ? Pure hypercholesterolemia 02/05/2021  ? Close exposure to COVID-19 virus 05/21/2020  ? Vertigo 11/25/2019  ? Mixed conductive and sensorineural hearing loss of both ears 11/25/2019  ? Dyspnea and respiratory abnormality 12/03/2015  ? ILD (interstitial lung disease)  (HCC) 12/03/2015  ? SOB (shortness of breath) 08/21/2015  ? DOE (dyspnea on exertion) 08/08/2015  ? Nausea 06/09/2011  ? Dermatitis, contact 06/09/2011  ? Cholelithiasis 05/08/2011  ? 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 ?PCP:  Blane Ohara, MD ?Pharmacy:   ?[id number] - [place], [place] - [address] ?[address] ?[facility] [place] [address] ?Phone: [phone] Fax: [phone] ? ? ? ? ?Social Determinants of Health (SDOH) Interventions ?  ? ?Readmission Risk Interventions ?   ? View : No data to display.  ?  ?  ?  ? ? ? ?

## 2022-01-25 NOTE — Progress Notes (Signed)
Pt arrived to the unit. VS WDL. Pt placed on telemetry.Call bell within reach. ?

## 2022-01-25 NOTE — Plan of Care (Signed)
  Problem: Health Behavior/Discharge Planning: Goal: Ability to manage health-related needs will improve Outcome: Progressing   Problem: Clinical Measurements: Goal: Diagnostic test results will improve Outcome: Progressing   

## 2022-01-26 ENCOUNTER — Encounter: Payer: Self-pay | Admitting: Family Medicine

## 2022-01-26 DIAGNOSIS — F330 Major depressive disorder, recurrent, mild: Secondary | ICD-10-CM | POA: Insufficient documentation

## 2022-01-26 DIAGNOSIS — K5792 Diverticulitis of intestine, part unspecified, without perforation or abscess without bleeding: Secondary | ICD-10-CM | POA: Diagnosis not present

## 2022-01-26 DIAGNOSIS — F331 Major depressive disorder, recurrent, moderate: Secondary | ICD-10-CM | POA: Insufficient documentation

## 2022-01-26 LAB — GLUCOSE, CAPILLARY
Glucose-Capillary: 176 mg/dL — ABNORMAL HIGH (ref 70–99)
Glucose-Capillary: 182 mg/dL — ABNORMAL HIGH (ref 70–99)
Glucose-Capillary: 261 mg/dL — ABNORMAL HIGH (ref 70–99)
Glucose-Capillary: 205 mg/dL — ABNORMAL HIGH (ref 70–99)

## 2022-01-26 LAB — HIV ANTIBODY (ROUTINE TESTING W REFLEX): HIV Screen 4th Generation wRfx: NONREACTIVE

## 2022-01-26 LAB — LACTIC ACID, PLASMA: Lactic Acid, Venous: 0.9 mmol/L (ref 0.5–1.9)

## 2022-01-26 MED ORDER — SODIUM CHLORIDE 0.9 % IV BOLUS
250.0000 mL | Freq: Once | INTRAVENOUS | Status: AC
  Administered 2022-01-26: 250 mL via INTRAVENOUS

## 2022-01-26 MED ORDER — HYDRALAZINE HCL 10 MG PO TABS: 10.0000 mg | ORAL_TABLET | Freq: Three times a day (TID) | ORAL | Status: DC | PRN

## 2022-01-26 NOTE — Assessment & Plan Note (Signed)
Increase rybelsus to 7 mg once daily.  ? ?

## 2022-01-26 NOTE — Assessment & Plan Note (Signed)
Continue trintellix 10 mg daily.  ?Start belsomra for sleep. Samples given.  ?

## 2022-01-26 NOTE — Plan of Care (Signed)

## 2022-01-26 NOTE — Plan of Care (Signed)
  Problem: Clinical Measurements: Goal: Ability to maintain clinical measurements within normal limits will improve Outcome: Progressing   

## 2022-01-26 NOTE — Assessment & Plan Note (Signed)
Start belsomra 15 mg before bed. Samples given.  ?

## 2022-01-26 NOTE — Progress Notes (Signed)
?PROGRESS NOTE ? ? ? ?Robin Bonilla  [id number] DOB: 1955/12/15 DOA: 01/25/2022 ?PCP: [name], MD  ? ? ?Brief Narrative:  ? ?Robin Bonilla is a 66 year old female with past medical history significant for type 2 diabetes mellitus, hypothyroidism, depression, hyperlipidemia who presented to [facility] ED on 4/29 with complaints of left lower quadrant abdominal pain.  Described as severe, burning in nature.  Denies any significant nausea, vomiting, diarrhea, or blood in her stools.  Last colonoscopy 2018 revealing diverticulosis of the sigmoid colon and 2 mm sessile polyp that was removed by [name]. ? ?In the ED, temperature 100.1 ?F, HR 105, RR 18, BP 118/79, SPO2 98% on room air.  Sodium 138, potassium 4.4, chloride 103, CO2 26, glucose 263, BUN 11, creatinine 0.83.  AST 17, ALT 23, total bilirubin 1.8.  Lactic acid 0.9.  WBC 13.4, hemoglobin 14.3, platelet count 248.  Urinalysis with negative leukocytes, positive nitrite, few bacteria, 0-5 WBCs.  CT abdomen/pelvis with acute diverticulitis proximal sigmoid colon, no perforation, adjacent 18 mm area of mural low-density in the sigmoid colon without peripheral enhancement, possible intramural phlegmon without drainable abscess.  Blood cultures x2 drawn.  Started on empiric antibiotics.  Hospital service consulted for further evaluation and management of acute diverticulitis. ? ?Assessment & Plan: ?  ?Acute diverticulitis sigmoid colon ?Patient presenting to the ED with left lower quadrant pain with associated temperature 100.1 and WBC count of 13.4. CT abdomen/pelvis with acute diverticulitis proximal sigmoid colon, no perforation, adjacent 18 mm area of mural low-density in the sigmoid colon without peripheral enhancement, possible intramural phlegmon without drainable abscess. ?--WBC 13.4>12.8 ?--Advance to clear liquid diet; further advancement as tolerates ?--NS at 75 mL/h ?--Ceftriaxone 2 g IV every 24 hours ?--Metronidazole 500 mg IV every  12 hours ?--Norco 5-325 mg PO q6h PRN moderate pain ?--Morphine 2mg  q4h PRN severe pain ?--Antiemetics ?--CBC, CMP daily ? ?Pancreatic head cyst ?CT abdomen/pelvis with subcentimeter cyst pancreatic head, smaller than prior MRI consistent with benign etiology; no dedicated follow-up required. ? ?Type 2 diabetes mellitus ?Hemoglobin A1c 9.7 on 12/27/2021, poorly controlled.  Home regimen includes Dapagliflozin-metformin 10-1000mg  PO BID, Semaglutide 7mg  PO daily. ?--SSI for coverage ?--CBG before every meal/at bedtime ? ?Hypothyroidism ?--Levothyroxine 137 mcg p.o. daily ? ?Essential hypertension ?--Hold home lisinopril 10 mg p.o. daily for borderline hypotension ?--Hydralazine 10mg  PO q8h PRN SBP >165 or DBP >110 ?--Continue monitor BP closely ? ?Hyperlipidemia ?--Crestor 20 mg p.o. daily ? ?Depression ?--Citalopram 20 mg p.o. daily ? ?Morbid obesity ?Body mass index is 33.01 kg/m?Marland Kitchen  Discussed with patient needs for aggressive lifestyle changes/weight loss as this complicates all facets of care.  Outpatient follow-up with PCP.  May benefit from bariatric evaluation outpatient. ? ? ?DVT prophylaxis: enoxaparin (LOVENOX) injection 40 mg Start: 01/25/22 2200 ? ?  Code Status: Full Code ?Family Communication: Updated family present at bedside this morning ? ?Disposition Plan:  ?Level of care: Telemetry Medical ?Status is: Inpatient ?Remains inpatient appropriate because: IV antibiotics, needs further advancement of diet with improvement of pain; anticipate discharge home in 2-3 days ?  ? ?Consultants:  ?none ? ?Procedures:  ?None ? ?Antimicrobials:  ?Ceftriaxone 4/29>> ?Metronidazole 4/29 ? ? ?Subjective: ?Patient seen examined at bedside, resting comfortably.  Continues with mild left lower quadrant pain and nausea.  Requesting something to eat.  Family present at bedside.  Discussed with patient natural course of diverticulitis and will advance diet to clears today.  Discussed will need close outpatient follow-up with  her gastroenterologist, Dr.  [name] within 6 weeks for colonoscopy.  No other specific questions or concerns at this time.  Denies headache, no chest pain, no palpitations, no shortness of breath, no fever/chills/night sweats currently, no vomiting/diarrhea, no weakness, no fatigue, no paresthesias.  No acute events overnight per nursing staff. ? ?Objective: ?Vitals:  ? 01/25/22 2337 01/26/22 0320 01/26/22 0606 01/26/22 0900  ?BP: (!) 96/54 112/72 110/65 110/72  ?Pulse: 89 85 83   ?Resp: 18 17 16    ?Temp: 98.4 ?F (36.9 ?C) 98.4 ?F (36.9 ?C)    ?TempSrc: Oral Oral    ?SpO2: 92%  98%   ?Height: 5' (1.524 m)     ? ? ?Intake/Output Summary (Last 24 hours) at 01/26/2022 1214 ?Last data filed at 01/26/2022 0530 ?Gross per 24 hour  ?Intake 1062.5 ml  ?Output --  ?Net 1062.5 ml  ? ?There were no vitals filed for this visit. ? ?Examination: ? ?Physical Exam: ?GEN: NAD, alert and oriented x 3, obese ?HEENT: NCAT, PERRL, EOMI, sclera clear, MMM ?PULM: CTAB w/o wheezes/crackles, normal respiratory effort, on room air ?CV: RRR w/o M/G/R ?GI: abd soft, mild tenderness to palpation left lower quadrant, nondistended, NABS, no R/G/M ?MSK: no peripheral edema, muscle strength globally intact 5/5 bilateral upper/lower extremities ?NEURO: CN II-XII intact, no focal deficits, sensation to light touch intact ?PSYCH: normal mood/affect ?Integumentary: dry/intact, no rashes or wounds ? ? ? ?Data Reviewed: I have personally reviewed following labs and imaging studies ? ?CBC: ?Recent Labs  ?Lab 01/25/22 ?1308 01/25/22 ?2320  ?WBC 13.4* 12.8*  ?HGB 14.3 13.4  ?HCT 42.3 39.3  ?MCV 81.0 80.7  ?PLT 248 185  ? ?Basic Metabolic Panel: ?Recent Labs  ?Lab 01/25/22 ?1308 01/25/22 ?2320  ?NA 138 138  ?K 4.4 3.9  ?CL 103 102  ?CO2 26 25  ?GLUCOSE 263* 221*  ?BUN 11 11  ?CREATININE 0.83 0.75  ?CALCIUM 9.5 9.1  ? ?GFR: ?Estimated Creatinine Clearance: 65 mL/min (by C-G formula based on SCr of 0.75 mg/dL). ?Liver Function Tests: ?Recent Labs  ?Lab  01/25/22 ?1308  ?AST 17  ?ALT 23  ?ALKPHOS 74  ?BILITOT 1.8*  ?PROT 7.3  ?ALBUMIN 3.8  ? ?Recent Labs  ?Lab 01/25/22 ?1308  ?LIPASE 31  ? ?No results for input(s): AMMONIA in the last 168 hours. ?Coagulation Profile: ?No results for input(s): INR, PROTIME in the last 168 hours. ?Cardiac Enzymes: ?No results for input(s): CKTOTAL, CKMB, CKMBINDEX, TROPONINI in the last 168 hours. ?BNP (last 3 results) ?No results for input(s): PROBNP in the last 8760 hours. ?HbA1C: ?No results for input(s): HGBA1C in the last 72 hours. ?CBG: ?Recent Labs  ?Lab 01/25/22 ?2140 01/26/22 ?[id number] 01/26/22 ?0602  ?GLUCAP 212* 176* 182*  ? ?Lipid Profile: ?No results for input(s): CHOL, HDL, LDLCALC, TRIG, CHOLHDL, LDLDIRECT in the last 72 hours. ?Thyroid Function Tests: ?No results for input(s): TSH, T4TOTAL, FREET4, T3FREE, THYROIDAB in the last 72 hours. ?Anemia Panel: ?No results for input(s): VITAMINB12, FOLATE, FERRITIN, TIBC, IRON, RETICCTPCT in the last 72 hours. ?Sepsis Labs: ?Recent Labs  ?Lab 01/25/22 ?1629 01/25/22 ?2320  ?LATICACIDVEN 0.9 0.9  ? ? ?Recent Results (from the past 240 hour(s))  ?Blood culture (routine x 2)     Status: None (Preliminary result)  ? Collection Time: 01/25/22  4:29 PM  ? Specimen: BLOOD  ?Result Value Ref Range Status  ? Specimen Description BLOOD SITE NOT SPECIFIED  Final  ? Special Requests   Final  ?  BOTTLES DRAWN AEROBIC AND ANAEROBIC Blood Culture adequate volume  ?  Culture   Final  ?  NO GROWTH < 24 HOURS ?Performed at [facility], [place] [address] [place], [place] [address] ?  ? Report Status PENDING  Incomplete  ?Blood culture (routine x 2)     Status: None (Preliminary result)  ? Collection Time: 01/25/22  4:34 PM  ? Specimen: BLOOD  ?Result Value Ref Range Status  ? Specimen Description BLOOD SITE NOT SPECIFIED  Final  ? Special Requests   Final  ?  BOTTLES DRAWN AEROBIC AND ANAEROBIC Blood Culture adequate volume  ? Culture   Final  ?  NO GROWTH < 24 HOURS ?Performed at [facility], [place] [address] [place],  [address] ?  ? Report Status PENDING  Incomplete  ?  ? ? ? ? ? ?Radiology Studies: ?CT ABDOMEN PELVIS W CONTRAST ? ?Result Date: 01/25/2022 ?CLINICAL DATA:  Acute nonlocalize

## 2022-01-27 ENCOUNTER — Telehealth: Payer: Self-pay

## 2022-01-27 DIAGNOSIS — K5792 Diverticulitis of intestine, part unspecified, without perforation or abscess without bleeding: Secondary | ICD-10-CM | POA: Diagnosis not present

## 2022-01-27 LAB — COMPREHENSIVE METABOLIC PANEL
ALT: 19 U/L (ref 0–44)
AST: 16 U/L (ref 15–41)
Albumin: 2.9 g/dL — ABNORMAL LOW (ref 3.5–5.0)
Alkaline Phosphatase: 60 U/L (ref 38–126)
Anion gap: 7 (ref 5–15)
BUN: 9 mg/dL (ref 8–23)
CO2: 26 mmol/L (ref 22–32)
Calcium: 8.4 mg/dL — ABNORMAL LOW (ref 8.9–10.3)
Chloride: 104 mmol/L (ref 98–111)
Creatinine, Ser: 0.53 mg/dL (ref 0.44–1.00)
GFR, Estimated: >60 mL/min (ref >60)
Glucose, Bld: 139 mg/dL — ABNORMAL HIGH (ref 70–99)
Potassium: 3.8 mmol/L (ref 3.5–5.1)
Sodium: 137 mmol/L (ref 135–145)
Total Bilirubin: 1 mg/dL (ref 0.3–1.2)
Total Protein: 5.9 g/dL — ABNORMAL LOW (ref 6.5–8.1)

## 2022-01-27 LAB — CBC
HCT: 36.6 % (ref 36.0–46.0)
Hemoglobin: 11.8 g/dL — ABNORMAL LOW (ref 12.0–15.0)
MCH: 26.9 pg (ref 26.0–34.0)
MCHC: 32.2 g/dL (ref 30.0–36.0)
MCV: 83.4 fL (ref 80.0–100.0)
Platelets: 174 10*3/uL (ref 150–400)
RBC: 4.39 MIL/uL (ref 3.87–5.11)
RDW: 13.8 % (ref 11.5–15.5)
WBC: 8 10*3/uL (ref 4.0–10.5)
nRBC: 0 % (ref 0.0–0.2)

## 2022-01-27 LAB — GLUCOSE, CAPILLARY
Glucose-Capillary: 156 mg/dL — ABNORMAL HIGH (ref 70–99)
Glucose-Capillary: 199 mg/dL — ABNORMAL HIGH (ref 70–99)
Glucose-Capillary: 208 mg/dL — ABNORMAL HIGH (ref 70–99)
Glucose-Capillary: 194 mg/dL — ABNORMAL HIGH (ref 70–99)
Glucose-Capillary: 169 mg/dL — ABNORMAL HIGH (ref 70–99)
Glucose-Capillary: 220 mg/dL — ABNORMAL HIGH (ref 70–99)

## 2022-01-27 MED ORDER — INSULIN ASPART 100 UNIT/ML IJ SOLN
0.0000 [IU] | Freq: Three times a day (TID) | INTRAMUSCULAR | Status: DC
  Administered 2022-01-27 (×2): 3 [IU] via SUBCUTANEOUS
  Administered 2022-01-28: 5 [IU] via SUBCUTANEOUS
  Administered 2022-01-28: 8 [IU] via SUBCUTANEOUS
  Administered 2022-01-28: 5 [IU] via SUBCUTANEOUS
  Administered 2022-01-29: 8 [IU] via SUBCUTANEOUS

## 2022-01-27 NOTE — Telephone Encounter (Signed)
Husband calling, would like PCP aware patient is in [facility] for diverticulitis.  ? ?Terrill Mohr 01/27/22 11:41 AM ? ?

## 2022-01-27 NOTE — Plan of Care (Signed)

## 2022-01-27 NOTE — Progress Notes (Signed)
?PROGRESS NOTE ? ? ? ?Robin Bonilla  VOZ:[id number] DOB: 02-May-1956 DOA: 01/25/2022 ?PCP: Blane Ohara, MD  ? ? ?Brief Narrative:  ? ?Robin Bonilla is a 66 year old female with past medical history significant for type 2 diabetes mellitus, hypothyroidism, depression, hyperlipidemia who presented to [facility] ED on 4/29 with complaints of left lower quadrant abdominal pain.  Described as severe, burning in nature.  Denies any significant nausea, vomiting, diarrhea, or blood in her stools.  Last colonoscopy 2018 revealing diverticulosis of the sigmoid colon and 2 mm sessile polyp that was removed by Dr. Charm Barges. ? ?In the ED, temperature 100.1 ?F, HR 105, RR 18, BP 118/79, SPO2 98% on room air.  Sodium 138, potassium 4.4, chloride 103, CO2 26, glucose 263, BUN 11, creatinine 0.83.  AST 17, ALT 23, total bilirubin 1.8.  Lactic acid 0.9.  WBC 13.4, hemoglobin 14.3, platelet count 248.  Urinalysis with negative leukocytes, positive nitrite, few bacteria, 0-5 WBCs.  CT abdomen/pelvis with acute diverticulitis proximal sigmoid colon, no perforation, adjacent 18 mm area of mural low-density in the sigmoid colon without peripheral enhancement, possible intramural phlegmon without drainable abscess.  Blood cultures x2 drawn.  Started on empiric antibiotics.  Hospital service consulted for further evaluation and management of acute diverticulitis. ? ?Assessment & Plan: ?  ?Acute diverticulitis sigmoid colon ?Patient presenting to the ED with left lower quadrant pain with associated temperature 100.1 and WBC count of 13.4. CT abdomen/pelvis with acute diverticulitis proximal sigmoid colon, no perforation, adjacent 18 mm area of mural low-density in the sigmoid colon without peripheral enhancement, possible intramural phlegmon without drainable abscess. ?--WBC 13.4>12.8>8.0 ?--Advance to full liquid diet; further advancement as tolerates ?--NS at 75 mL/h ?--Ceftriaxone 2 g IV every 24 hours ?--Metronidazole 500 mg IV  every 12 hours ?--Norco 5-325 mg PO q6h PRN moderate pain ?--Morphine 2mg  q4h PRN severe pain ?--Antiemetics ?--CBC, CMP daily ?--Will need colonoscopy outpatient 6 weeks with GI, Dr. ? ?Pancreatic head cyst ?CT abdomen/pelvis with subcentimeter cyst pancreatic head, smaller than prior MRI consistent with benign etiology; no dedicated follow-up required. ? ?Type 2 diabetes mellitus ?Hemoglobin A1c 9.7 on 12/27/2021, poorly controlled.  Home regimen includes Dapagliflozin-metformin 10-1000mg  PO BID, Semaglutide 7mg  PO daily. ?--SSI for coverage ?--CBG before every meal/at bedtime ? ?Hypothyroidism ?--Levothyroxine 137 mcg p.o. daily ? ?Essential hypertension ?--Hold home lisinopril 10 mg p.o. daily for borderline hypotension ?--Hydralazine 10mg  PO q8h PRN SBP >165 or DBP >110 ?--Continue monitor BP closely ? ?Hyperlipidemia ?--Crestor 20 mg p.o. daily ? ?Depression ?--Citalopram 20 mg p.o. daily ? ?Morbid obesity ?Body mass index is 33.01 kg/m?12/29/2021  Discussed with patient needs for aggressive lifestyle changes/weight loss as this complicates all facets of care.  Outpatient follow-up with PCP.  May benefit from bariatric evaluation outpatient. ? ? ?DVT prophylaxis: enoxaparin (LOVENOX) injection 40 mg Start: 01/25/22 2200 ? ?  Code Status: Full Code ?Family Communication: No family present at bedside this morning ? ?Disposition Plan:  ?Level of care: Telemetry Medical ?Status is: Inpatient ?Remains inpatient appropriate because: IV antibiotics, needs further advancement of diet with improvement of pain; anticipate discharge home in 1-2 days ?  ? ?Consultants:  ?none ? ?Procedures:  ?None ? ?Antimicrobials:  ?Ceftriaxone 4/29>> ?Metronidazole 4/29 ? ? ?Subjective: ?Patient seen examined at bedside, resting comfortably.  Abdominal discomfort much improved, requesting further advancement of diet.  Discussed with patient we will advance to full liquid diet today.  No other specific questions or concerns at this time.  Denies headache, no chest  pain, no palpitations, no shortness of breath, no fever/chills/night sweats, no nausea/vomiting/diarrhea, no weakness, no fatigue, no paresthesias.  No acute events overnight per nursing staff. ? ?Objective: ?Vitals:  ? 01/26/22 0900 01/26/22 1942 01/27/22 0320 01/27/22 0724  ?BP: 110/72 103/68 116/65 118/65  ?Pulse:  69 74 74  ?Resp:  16 14 18   ?Temp:  98.3 ?F (36.8 ?C) 98.8 ?F (37.1 ?C) 98.1 ?F (36.7 ?C)  ?TempSrc:  Oral Oral Oral  ?SpO2:  100% 99% 97%  ?Height:      ? ? ?Intake/Output Summary (Last 24 hours) at 01/27/2022 1112 ?Last data filed at 01/27/2022 0357 ?Gross per 24 hour  ?Intake 807.5 ml  ?Output --  ?Net 807.5 ml  ? ?There were no vitals filed for this visit. ? ?Examination: ? ?Physical Exam: ?GEN: NAD, alert and oriented x 3, obese ?HEENT: NCAT, PERRL, EOMI, sclera clear, MMM ?PULM: CTAB w/o wheezes/crackles, normal respiratory effort, on room air ?CV: RRR w/o M/G/R ?GI: abd soft, mild tenderness to palpation left lower quadrant, nondistended, NABS, no R/G/M ?MSK: no peripheral edema, muscle strength globally intact 5/5 bilateral upper/lower extremities ?NEURO: CN II-XII intact, no focal deficits, sensation to light touch intact ?PSYCH: normal mood/affect ?Integumentary: dry/intact, no rashes or wounds ? ? ? ?Data Reviewed: I have personally reviewed following labs and imaging studies ? ?CBC: ?Recent Labs  ?Lab 01/25/22 ?1308 01/25/22 ?2320 01/27/22 ?0018  ?WBC 13.4* 12.8* 8.0  ?HGB 14.3 13.4 11.8*  ?HCT 42.3 39.3 36.6  ?MCV 81.0 80.7 83.4  ?PLT 248 185 174  ? ?Basic Metabolic Panel: ?Recent Labs  ?Lab 01/25/22 ?1308 01/25/22 ?2320 01/27/22 ?0018  ?NA 138 138 137  ?K 4.4 3.9 3.8  ?CL 103 102 104  ?CO2 26 25 26   ?GLUCOSE 263* 221* 139*  ?BUN 11 11 9   ?CREATININE 0.83 0.75 0.53  ?CALCIUM 9.5 9.1 8.4*  ? ?GFR: ?Estimated Creatinine Clearance: 65 mL/min (by C-G formula based on SCr of 0.53 mg/dL). ?Liver Function Tests: ?Recent Labs  ?Lab 01/25/22 ?1308 01/27/22 ?0018  ?AST 17 16   ?ALT 23 19  ?ALKPHOS 74 60  ?BILITOT 1.8* 1.0  ?PROT 7.3 5.9*  ?ALBUMIN 3.8 2.9*  ? ?Recent Labs  ?Lab 01/25/22 ?1308  ?LIPASE 31  ? ?No results for input(s): AMMONIA in the last 168 hours. ?Coagulation Profile: ?No results for input(s): INR, PROTIME in the last 168 hours. ?Cardiac Enzymes: ?No results for input(s): CKTOTAL, CKMB, CKMBINDEX, TROPONINI in the last 168 hours. ?BNP (last 3 results) ?No results for input(s): PROBNP in the last 8760 hours. ?HbA1C: ?No results for input(s): HGBA1C in the last 72 hours. ?CBG: ?Recent Labs  ?Lab 01/26/22 ?1708 01/26/22 ?1941 01/27/22 ?01/28/22 01/27/22 ?03/29/22 01/27/22 ?03/29/22  ?GLUCAP 261* 205* 156* 199* 208*  ? ?Lipid Profile: ?No results for input(s): CHOL, HDL, LDLCALC, TRIG, CHOLHDL, LDLDIRECT in the last 72 hours. ?Thyroid Function Tests: ?No results for input(s): TSH, T4TOTAL, FREET4, T3FREE, THYROIDAB in the last 72 hours. ?Anemia Panel: ?No results for input(s): VITAMINB12, FOLATE, FERRITIN, TIBC, IRON, RETICCTPCT in the last 72 hours. ?Sepsis Labs: ?Recent Labs  ?Lab 01/25/22 ?1629 01/25/22 ?2320  ?LATICACIDVEN 0.9 0.9  ? ? ?Recent Results (from the past 240 hour(s))  ?Blood culture (routine x 2)     Status: None (Preliminary result)  ? Collection Time: 01/25/22  4:29 PM  ? Specimen: BLOOD  ?Result Value Ref Range Status  ? Specimen Description BLOOD SITE NOT SPECIFIED  Final  ? Special Requests   Final  ?  BOTTLES  DRAWN AEROBIC AND ANAEROBIC Blood Culture adequate volume  ? Culture   Final  ?  NO GROWTH 2 DAYS ?Performed at [facility], [address] [place], [place] [address] ?  ? Report Status PENDING  Incomplete  ?Blood culture (routine x 2)     Status: None (Preliminary result)  ? Collection Time: 01/25/22  4:34 PM  ? Specimen: BLOOD  ?Result Value Ref Range Status  ? Specimen Description BLOOD SITE NOT SPECIFIED  Final  ? Special Requests   Final  ?  BOTTLES DRAWN AEROBIC AND ANAEROBIC Blood Culture adequate volume  ? Culture   Final  ?  NO GROWTH 2  DAYS ?Performed at [facility], [address] [place], [place] [address] ?  ? Report Status PENDING  Incomplete  ?  ? ? ? ? ? ?Radiology Studies: ?CT ABDOMEN PELVIS W CONTRAST ? ?Result Date: 01/25/2022 ?C

## 2022-01-28 DIAGNOSIS — K5792 Diverticulitis of intestine, part unspecified, without perforation or abscess without bleeding: Secondary | ICD-10-CM | POA: Diagnosis not present

## 2022-01-28 LAB — CBC
HCT: 38.1 % (ref 36.0–46.0)
Hemoglobin: 12.7 g/dL (ref 12.0–15.0)
MCH: 27.1 pg (ref 26.0–34.0)
MCHC: 33.3 g/dL (ref 30.0–36.0)
MCV: 81.4 fL (ref 80.0–100.0)
Platelets: 181 10*3/uL (ref 150–400)
RBC: 4.68 MIL/uL (ref 3.87–5.11)
RDW: 13.6 % (ref 11.5–15.5)
WBC: 6.7 10*3/uL (ref 4.0–10.5)
nRBC: 0 % (ref 0.0–0.2)

## 2022-01-28 LAB — BASIC METABOLIC PANEL
Anion gap: 8 (ref 5–15)
BUN: 7 mg/dL — ABNORMAL LOW (ref 8–23)
CO2: 24 mmol/L (ref 22–32)
Calcium: 8.8 mg/dL — ABNORMAL LOW (ref 8.9–10.3)
Chloride: 104 mmol/L (ref 98–111)
Creatinine, Ser: 0.61 mg/dL (ref 0.44–1.00)
GFR, Estimated: >60 mL/min (ref >60)
Glucose, Bld: 190 mg/dL — ABNORMAL HIGH (ref 70–99)
Potassium: 4 mmol/L (ref 3.5–5.1)
Sodium: 136 mmol/L (ref 135–145)

## 2022-01-28 LAB — GLUCOSE, CAPILLARY
Glucose-Capillary: 270 mg/dL — ABNORMAL HIGH (ref 70–99)
Glucose-Capillary: 208 mg/dL — ABNORMAL HIGH (ref 70–99)
Glucose-Capillary: 218 mg/dL — ABNORMAL HIGH (ref 70–99)
Glucose-Capillary: 212 mg/dL — ABNORMAL HIGH (ref 70–99)

## 2022-01-28 MED ORDER — INSULIN GLARGINE-YFGN 100 UNIT/ML ~~LOC~~ SOLN
8.0000 [IU] | Freq: Every day | SUBCUTANEOUS | Status: DC
  Administered 2022-01-29: 8 [IU] via SUBCUTANEOUS
  Filled 2022-01-28 (×2): qty 0.08

## 2022-01-28 NOTE — Progress Notes (Addendum)
?PROGRESS NOTE ? ? ? ?Robin Bonilla  ZOX:[id number] DOB: October 09, 1955 DOA: 01/25/2022 ?PCP: Blane Oharaox, Kirsten, MD  ? ? ?Brief Narrative:  ? ?Robin Bonilla Bilyk is a 66 year old female with past medical history significant for type 2 diabetes mellitus, hypothyroidism, depression, hyperlipidemia who presented to [facility] ED on 4/29 with complaints of left lower quadrant abdominal pain.  Described as severe, burning in nature.  Denies any significant nausea, vomiting, diarrhea, or blood in her stools.  Last colonoscopy 2018 revealing diverticulosis of the sigmoid colon and 2 mm sessile polyp that was removed by Dr. Charm Bonilla. ? ?In the ED, temperature 100.1 ?F, HR 105, RR 18, BP 118/79, SPO2 98% on room air.  Sodium 138, potassium 4.4, chloride 103, CO2 26, glucose 263, BUN 11, creatinine 0.83.  AST 17, ALT 23, total bilirubin 1.8.  Lactic acid 0.9.  WBC 13.4, hemoglobin 14.3, platelet count 248.  Urinalysis with negative leukocytes, positive nitrite, few bacteria, 0-5 WBCs.  CT abdomen/pelvis with acute diverticulitis proximal sigmoid colon, no perforation, adjacent 18 mm area of mural low-density in the sigmoid colon without peripheral enhancement, possible intramural phlegmon without drainable abscess.  Blood cultures x2 drawn.  Started on empiric antibiotics.  Hospital service consulted for further evaluation and management of acute diverticulitis. ? ?Assessment & Plan: ?  ?Acute diverticulitis sigmoid colon ?Patient presenting to the ED with left lower quadrant pain with associated temperature 100.1 and WBC count of 13.4. CT abdomen/pelvis with acute diverticulitis proximal sigmoid colon, no perforation, adjacent 18 mm area of mural low-density in the sigmoid colon without peripheral enhancement, possible intramural phlegmon without drainable abscess. ?--WBC 13.4>12.8>8.0>6.7 ?--Advance to soft diet today ?--Discontinue IV fluids today ?--Ceftriaxone 2 g IV every 24 hours ?--Metronidazole 500 mg IV every 12  hours ?--Norco 5-325 mg PO q6h PRN moderate pain ?--Morphine 2mg  q4h PRN severe pain ?--Antiemetics ?--CBC, CMP daily ?--Will need colonoscopy outpatient 6 weeks with GI, Dr. Charm Bonilla ? ?Pancreatic head cyst ?CT abdomen/pelvis with subcentimeter cyst pancreatic head, smaller than prior MRI consistent with benign etiology; no dedicated follow-up required. ? ?Type 2 diabetes mellitus ?Hemoglobin A1c 9.7 on 12/27/2021, poorly controlled.  Home regimen includes Dapagliflozin-metformin 10-1000mg  PO BID, Semaglutide 7mg  PO daily. ?--Semglee 8u [place] daily ?--SSI for coverage ?--CBG before every meal/at bedtime ? ?Hypothyroidism ?--Levothyroxine 137 mcg p.o. daily ? ?Essential hypertension ?--Hold home lisinopril 10 mg p.o. daily for borderline hypotension ?--Hydralazine 10mg  PO q8h PRN SBP >165 or DBP >110 ?--Continue monitor BP closely ? ?Hyperlipidemia ?--Crestor 20 mg p.o. daily ? ?Depression ?--Citalopram 20 mg p.o. daily ? ?Morbid obesity ?Body mass index is 33.01 kg/m?[place] Kitchen.  Discussed with patient needs for aggressive lifestyle changes/weight loss as this complicates all facets of care.  Outpatient follow-up with PCP.  May benefit from bariatric evaluation outpatient. ? ? ?DVT prophylaxis: enoxaparin (LOVENOX) injection 40 mg Start: 01/25/22 2200 ? ?  Code Status: Full Code ?Family Communication: No family present at bedside this morning ? ?Disposition Plan:  ?Level of care: Telemetry Medical ?Status is: Inpatient ?Remains inpatient appropriate because: IV antibiotics, needs further advancement of diet with improvement of pain; anticipate discharge home tomorrow if tolerates advance diet today ?  ? ?Consultants:  ?none ? ?Procedures:  ?None ? ?Antimicrobials:  ?Ceftriaxone 4/29>> ?Metronidazole 4/29>> ? ? ?Subjective: ?Patient seen examined at bedside, resting comfortably.  Continues with gradual improvement of her left lower quadrant abdominal pain.  Tolerating full liquid diet, would like to attempt a soft diet today.   Discussed with patient if she tolerates diet  today will likely discharge home to complete antibiotic course.  No other specific questions or concerns at this time. Denies headache, no chest pain, no palpitations, no shortness of breath, no fever/chills/night sweats, no nausea/vomiting/diarrhea, no weakness, no fatigue, no paresthesias.  No acute events overnight per nursing staff. ? ?Objective: ?Vitals:  ? 01/27/22 0724 01/27/22 1435 01/27/22 2131 01/28/22 0913  ?BP: 118/65 (!) 103/54 115/69 115/64  ?Pulse: 74 73 90 70  ?Resp: 18 17 17 16   ?Temp: 98.1 ?F (36.7 ?C) 98.3 ?F (36.8 ?C) 97.6 ?F (36.4 ?C) 98.5 ?F (36.9 ?C)  ?TempSrc: Oral Oral Oral Oral  ?SpO2: 97% 97% 98% 98%  ?Height:      ? ? ?Intake/Output Summary (Last 24 hours) at 01/28/2022 1120 ?Last data filed at 01/28/2022 0600 ?Gross per 24 hour  ?Intake 1090 ml  ?Output --  ?Net 1090 ml  ? ?There were no vitals filed for this visit. ? ?Examination: ? ?Physical Exam: ?GEN: NAD, alert and oriented x 3, obese ?HEENT: NCAT, PERRL, EOMI, sclera clear, MMM ?PULM: CTAB w/o wheezes/crackles, normal respiratory effort, on room air ?CV: RRR w/o M/G/R ?GI: abd soft, mild tenderness to palpation left lower quadrant, nondistended, NABS, no R/G/M ?MSK: no peripheral edema, muscle strength globally intact 5/5 bilateral upper/lower extremities ?NEURO: CN II-XII intact, no focal deficits, sensation to light touch intact ?PSYCH: normal mood/affect ?Integumentary: dry/intact, no rashes or wounds ? ? ? ?Data Reviewed: I have personally reviewed following labs and imaging studies ? ?CBC: ?Recent Labs  ?Lab 01/25/22 ?1308 01/25/22 ?2320 01/27/22 ?0018 01/28/22 ?0128  ?WBC 13.4* 12.8* 8.0 6.7  ?HGB 14.3 13.4 11.8* 12.7  ?HCT 42.3 39.3 36.6 38.1  ?MCV 81.0 80.7 83.4 81.4  ?PLT 248 185 174 181  ? ?Basic Metabolic Panel: ?Recent Labs  ?Lab 01/25/22 ?1308 01/25/22 ?2320 01/27/22 ?0018 01/28/22 ?0128  ?NA 138 138 137 136  ?K 4.4 3.9 3.8 4.0  ?CL 103 102 104 104  ?CO2 26 25 26 24   ?GLUCOSE  263* 221* 139* 190*  ?BUN 11 11 9  7*  ?CREATININE 0.83 0.75 0.53 0.61  ?CALCIUM 9.5 9.1 8.4* 8.8*  ? ?GFR: ?CrCl cannot be calculated (Unknown ideal weight.). ?Liver Function Tests: ?Recent Labs  ?Lab 01/25/22 ?1308 01/27/22 ?0018  ?AST 17 16  ?ALT 23 19  ?ALKPHOS 74 60  ?BILITOT 1.8* 1.0  ?PROT 7.3 5.9*  ?ALBUMIN 3.8 2.9*  ? ?Recent Labs  ?Lab 01/25/22 ?1308  ?LIPASE 31  ? ?No results for input(s): AMMONIA in the last 168 hours. ?Coagulation Profile: ?No results for input(s): INR, PROTIME in the last 168 hours. ?Cardiac Enzymes: ?No results for input(s): CKTOTAL, CKMB, CKMBINDEX, TROPONINI in the last 168 hours. ?BNP (last 3 results) ?No results for input(s): PROBNP in the last 8760 hours. ?HbA1C: ?No results for input(s): HGBA1C in the last 72 hours. ?CBG: ?Recent Labs  ?Lab 01/27/22 ?0959 01/27/22 ?1129 01/27/22 ?1711 01/27/22 ?2133 01/28/22 ?03/29/22  ?GLUCAP 208* 194* 169* 220* 270*  ? ?Lipid Profile: ?No results for input(s): CHOL, HDL, LDLCALC, TRIG, CHOLHDL, LDLDIRECT in the last 72 hours. ?Thyroid Function Tests: ?No results for input(s): TSH, T4TOTAL, FREET4, T3FREE, THYROIDAB in the last 72 hours. ?Anemia Panel: ?No results for input(s): VITAMINB12, FOLATE, FERRITIN, TIBC, IRON, RETICCTPCT in the last 72 hours. ?Sepsis Labs: ?Recent Labs  ?Lab 01/25/22 ?1629 01/25/22 ?2320  ?LATICACIDVEN 0.9 0.9  ? ? ?Recent Results (from the past 240 hour(s))  ?Blood culture (routine x 2)     Status: None (Preliminary result)  ?  Collection Time: 01/25/22  4:29 PM  ? Specimen: BLOOD  ?Result Value Ref Range Status  ? Specimen Description BLOOD SITE NOT SPECIFIED  Final  ? Special Requests   Final  ?  BOTTLES DRAWN AEROBIC AND ANAEROBIC Blood Culture adequate volume  ? Culture   Final  ?  NO GROWTH 2 DAYS ?Performed at [facility], [address], [place], [place] [address] ?  ? Report Status PENDING  Incomplete  ?Blood culture (routine x 2)     Status: None (Preliminary result)  ? Collection Time: 01/25/22  4:34 PM  ?  Specimen: BLOOD  ?Result Value Ref Range Status  ? Specimen Description BLOOD SITE NOT SPECIFIED  Final  ? Special Requests   Final  ?  BOTTLES DRAWN AEROBIC AND ANAEROBIC Blood Culture adequate volume  ? Culture   [place]

## 2022-01-28 NOTE — Progress Notes (Signed)
Inpatient Diabetes Program Recommendations ? ?AACE/ADA: New Consensus Statement on Inpatient Glycemic Control (2015) ? ?Target Ranges:  Prepandial:   less than 140 mg/dL ?     Peak postprandial:   less than 180 mg/dL (1-2 hours) ?     Critically ill patients:  140 - 180 mg/dL  ? ? Latest Reference Range & Units 01/27/22 07:24 01/27/22 09:59 01/27/22 11:29 01/27/22 17:11 01/27/22 21:33  ?Glucose-Capillary 70 - 99 mg/dL 199 (H) 208 (H) ? ?5 units Novolog ? 194 (H) ? ?3 units Novolog ? 169 (H) ? ?3 units Novolog ? 220 (H)  ?(H): Data is abnormally high ? Latest Reference Range & Units 01/28/22 08:48  ?Glucose-Capillary 70 - 99 mg/dL 270 (H) ? ?8 units Novolog ?  ?(H): Data is abnormally high ? ? ?Home DM Meds: [name] 7 mg daily ?  Xigduo 01/999 mg 2 Tablets Daily ? ?Current Orders: Novolog 0-15 units TID ? ? ? ?MD- AM CBG today 270. ? ?Please consider starting Basal insulin in-hospital: ? ?Semglee 8 units Daily (0.1 units/kg) ? ? ? ?--Will follow patient during hospitalization-- ? ?Wyn Quaker RN, MSN, CDE ?Diabetes Coordinator ?Inpatient Glycemic Control Team ?Team Pager: [phone] (8a-5p) ? ? ?

## 2022-01-28 NOTE — Progress Notes (Signed)
Mobility Specialist Progress Note  ? ? 01/28/22 1211  ?Mobility  ?Activity Ambulated independently in hallway  ?Level of Assistance Independent  ?Assistive Device None  ?Distance Ambulated (ft) 570 ft  ?Activity Response Tolerated well  ?$Mobility charge 1 Mobility  ? ?Pt received in bed and agreeable. No complaints on walk. Returned to bed with call bell in reach.   ? ?[place] ?Mobility Specialist  ?Primary: 5N M.S. Phone: [phone] ?Secondary: 6N M.S. Phone: [phone] ?  ?

## 2022-01-29 ENCOUNTER — Other Ambulatory Visit (HOSPITAL_COMMUNITY): Payer: Self-pay

## 2022-01-29 DIAGNOSIS — K5792 Diverticulitis of intestine, part unspecified, without perforation or abscess without bleeding: Secondary | ICD-10-CM | POA: Diagnosis not present

## 2022-01-29 DIAGNOSIS — E039 Hypothyroidism, unspecified: Secondary | ICD-10-CM | POA: Diagnosis not present

## 2022-01-29 DIAGNOSIS — E1169 Type 2 diabetes mellitus with other specified complication: Secondary | ICD-10-CM | POA: Diagnosis not present

## 2022-01-29 DIAGNOSIS — E1159 Type 2 diabetes mellitus with other circulatory complications: Secondary | ICD-10-CM | POA: Diagnosis not present

## 2022-01-29 LAB — CBC
HCT: 39.5 % (ref 36.0–46.0)
Hemoglobin: 13.4 g/dL (ref 12.0–15.0)
MCH: 27.2 pg (ref 26.0–34.0)
MCHC: 33.9 g/dL (ref 30.0–36.0)
MCV: 80.3 fL (ref 80.0–100.0)
Platelets: 206 10*3/uL (ref 150–400)
RBC: 4.92 MIL/uL (ref 3.87–5.11)
RDW: 13.6 % (ref 11.5–15.5)
WBC: 6.1 10*3/uL (ref 4.0–10.5)
nRBC: 0 % (ref 0.0–0.2)

## 2022-01-29 LAB — BASIC METABOLIC PANEL
Anion gap: 10 (ref 5–15)
BUN: 8 mg/dL (ref 8–23)
CO2: 24 mmol/L (ref 22–32)
Calcium: 9.4 mg/dL (ref 8.9–10.3)
Chloride: 104 mmol/L (ref 98–111)
Creatinine, Ser: 0.73 mg/dL (ref 0.44–1.00)
GFR, Estimated: >60 mL/min (ref >60)
Glucose, Bld: 254 mg/dL — ABNORMAL HIGH (ref 70–99)
Potassium: 3.9 mmol/L (ref 3.5–5.1)
Sodium: 138 mmol/L (ref 135–145)

## 2022-01-29 LAB — GLUCOSE, CAPILLARY: Glucose-Capillary: 280 mg/dL — ABNORMAL HIGH (ref 70–99)

## 2022-01-29 MED ORDER — CEFDINIR 300 MG PO CAPS
300.0000 mg | ORAL_CAPSULE | Freq: Two times a day (BID) | ORAL | Status: DC
  Administered 2022-01-29: 300 mg via ORAL
  Filled 2022-01-29 (×2): qty 1

## 2022-01-29 MED ORDER — METRONIDAZOLE 500 MG PO TABS
500.0000 mg | ORAL_TABLET | Freq: Two times a day (BID) | ORAL | 0 refills | Status: AC
  Filled 2022-01-29: qty 12, 6d supply, fill #0

## 2022-01-29 MED ORDER — ONDANSETRON HCL 4 MG PO TABS
4.0000 mg | ORAL_TABLET | Freq: Four times a day (QID) | ORAL | 0 refills | Status: DC | PRN
  Filled 2022-01-29: qty 20, 5d supply, fill #0

## 2022-01-29 MED ORDER — HYDROCODONE-ACETAMINOPHEN 5-325 MG PO TABS
1.0000 | ORAL_TABLET | Freq: Four times a day (QID) | ORAL | 0 refills | Status: DC | PRN
Start: 2022-01-29 — End: 2022-04-09
  Filled 2022-01-29: qty 10, 3d supply, fill #0

## 2022-01-29 MED ORDER — CEFDINIR 300 MG PO CAPS
300.0000 mg | ORAL_CAPSULE | Freq: Two times a day (BID) | ORAL | 0 refills | Status: AC
  Filled 2022-01-29: qty 12, 6d supply, fill #0

## 2022-01-29 MED ORDER — METRONIDAZOLE 500 MG PO TABS: 500.0000 mg | ORAL_TABLET | Freq: Two times a day (BID) | ORAL | Status: DC

## 2022-01-29 NOTE — Plan of Care (Signed)
?  Problem: Clinical Measurements: ?Goal: Will remain free from infection ?Outcome: Completed/Met ?Goal: Diagnostic test results will improve ?Outcome: Completed/Met ?Goal: Cardiovascular complication will be avoided ?Outcome: Completed/Met ?  ?Problem: Nutrition: ?Goal: Adequate nutrition will be maintained ?Outcome: Completed/Met ?  ?

## 2022-01-29 NOTE — Discharge Summary (Signed)
?Physician Discharge Summary ?  ?Patient: Robin Bonilla MRN: [id number] DOB: [birth year]  ?Admit date:     01/25/2022  ?Discharge date: 01/29/22  ?Discharge Physician: Marguerita Merles, DO  ? ?PCP: Blane Ohara, MD  ? ?Recommendations at discharge:  ? ?Follow-up with PCP within 1 to 2 weeks and repeat CBC, CMP, mag, Phos within 1 week ?Follow-up with gastroenterology in 6 to 8 weeks and have outpatient elective colonoscopy done once illness has improved ?Follow-up pancreatic cyst in the head of the pancreas as an outpatient ? ?Discharge Diagnoses: ?Principal Problem: ?  Diverticulitis ?Active Problems: ?  Hyperglycemia due to type 2 diabetes mellitus (HCC) ?  Hypothyroidism (acquired) ?  Mixed diabetic hyperlipidemia associated with type 2 diabetes mellitus (HCC) ?  Hypertension associated with diabetes (HCC) ?  SIRS (systemic inflammatory response syndrome) (HCC) ? ?Resolved Problems: ?  * No resolved hospital problems. * ? ?Hospital Course: ?Robin Bonilla is a 66 year old female with past medical history significant for type 2 diabetes mellitus, hypothyroidism, depression, hyperlipidemia who presented to [facility] ED on 4/29 with complaints of left lower quadrant abdominal pain.  Described as severe, burning in nature.  Denies any significant nausea, vomiting, diarrhea, or blood in her stools.  Last colonoscopy 2018 revealing diverticulosis of the sigmoid colon and 2 mm sessile polyp that was removed by Dr. Charm Barges. ?  ?In the ED, temperature 100.1 ?F, HR 105, RR 18, BP 118/79, SPO2 98% on room air.  Sodium 138, potassium 4.4, chloride 103, CO2 26, glucose 263, BUN 11, creatinine 0.83.  AST 17, ALT 23, total bilirubin 1.8.  Lactic acid 0.9.  WBC 13.4, hemoglobin 14.3, platelet count 248.  Urinalysis with negative leukocytes, positive nitrite, few bacteria, 0-5 WBCs.  CT abdomen/pelvis with acute diverticulitis proximal sigmoid colon, no perforation, adjacent 18 mm area of mural low-density in the sigmoid colon  without peripheral enhancement, possible intramural phlegmon without drainable abscess.  Blood cultures x2 drawn.  Started on empiric antibiotics.  Hospital service consulted for further evaluation and management of acute diverticulitis. ? ?She improved and was able to tolerate her diet without issues.  She did just have some mild abdominal discomfort but no actual pain.  She will be transition to p.o. antibiotics and follow-up with her PCP and outpatient GI doctor for outpatient colonoscopy once illness has improved. ? ?Assessment and Plan: ? ?Acute diverticulitis sigmoid colon ?Patient presenting to the ED with left lower quadrant pain with associated temperature 100.1 and WBC count of 13.4. CT abdomen/pelvis with acute diverticulitis proximal sigmoid colon, no perforation, adjacent 18 mm area of mural low-density in the sigmoid colon without peripheral enhancement, possible intramural phlegmon without drainable abscess. ?--WBC 13.4>12.8>8.0>6.7 and is now 6.1 ?--Advance to soft diet yesterday ?--Discontinue IV fluids yesterday ?--Ceftriaxone 2 g IV every 24 hours and will change to p.o. cefdinir ?--Metronidazole 500 mg IV every 12 hours and will change to p.o. metronidazole for completion of her course for 10 days total ?--Norco 5-325 mg PO q6h PRN moderate pain and will give a short course for discharge ?--Morphine 2mg  q4h PRN severe pain ?--Antiemetics ?--CBC, CMP daily ?--Will need colonoscopy outpatient 6 weeks with GI, Dr. ?  ?Pancreatic head cyst ?-CT abdomen/pelvis with subcentimeter cyst pancreatic head, smaller than prior MRI consistent with benign etiology; no dedicated follow-up required. ?  ?Type 2 diabetes mellitus ?Hemoglobin A1c 9.7 on 12/27/2021, poorly controlled.   ?Home regimen includes Dapagliflozin-metformin 10-1000mg  PO BID, Semaglutide 7mg  PO daily.  Resume home regimen at discharge ?--Semglee  8u [place] daily ?--SSI for coverage ?--CBG before every meal/at bedtime ?-CBGs ranging from  208-280 ?  ?Hypothyroidism ?-Continue with levothyroxine 137 mcg p.o. daily ?  ?Essential hypertension ?--Hold home lisinopril 10 mg p.o. daily for borderline hypotension ?--Hydralazine 10mg  PO q8h PRN SBP >165 or DBP >110 ?--Continue monitor BP closely; last blood pressure reading was 119/74 ?  ?Hyperlipidemia ?--Crestor 20 mg p.o. daily ?  ?Depression ?--Citalopram 20 mg p.o. daily ?  ?Obesity ?-Complicates overall prognosis and care ?-Estimated body mass index is 33.01 kg/m? as calculated from the following: ?  Height as of this encounter: 5' (1.524 m). ?  Weight as of 01/14/22: 76.7 kg.  ?-Weight Loss and Dietary Counseling given ? ?Consultants: None ?Procedures performed: None  ?Disposition: Home ?Diet recommendation:  ?Discharge Diet Orders (From admission, onward)  ? ?  Start     Ordered  ? 01/29/22 0000  Diet - low sodium heart healthy       ? 01/29/22 1118  ? 01/29/22 0000  Diet Carb Modified       ? 01/29/22 1118  ? ?  ?  ? ?  ? ?Cardiac and Carb modified diet ?DISCHARGE MEDICATION: ?Allergies as of 01/29/2022   ? ?   Reactions  ? Lunesta [eszopiclone] Other (See Comments)  ? Fatigue.   ? Penicillins Rash  ? Simvastatin Other (See Comments)  ? nightmares   ? ?  ? ?  ?Medication List  ?  ? ?TAKE these medications   ? ?Accu-Chek Guide test strip ?Generic drug: glucose blood ?TEST STRIPS TWICE A DAY IN VITRO 30 DAYS ?  ?cefdinir 300 MG capsule ?Commonly known as: OMNICEF ?Take 1 capsule (300 mg total) by mouth every 12 (twelve) hours for 6 days. ?  ?citalopram 20 MG tablet ?Commonly known as: CELEXA ?TAKE 1 TABLET BY MOUTH EVERY DAY ?  ?HYDROcodone-acetaminophen 5-325 MG tablet ?Commonly known as: NORCO/VICODIN ?Take 1 tablet by mouth every 6 (six) hours as needed for moderate pain. ?  ?icosapent Ethyl 1 g capsule ?Commonly known as: Vascepa ?Take 2 capsules (2 g total) by mouth 2 (two) times daily. ?  ?levothyroxine 137 MCG tablet ?Commonly known as: SYNTHROID ?Take 1 tablet (137 mcg total) by mouth daily  before breakfast. ?  ?lisinopril 10 MG tablet ?Commonly known as: ZESTRIL ?Take 1 tablet (10 mg total) by mouth daily. ?  ?metroNIDAZOLE 500 MG tablet ?Commonly known as: FLAGYL ?Take 1 tablet (500 mg total) by mouth every 12 (twelve) hours for 6 days. ?  ?ondansetron 4 MG tablet ?Commonly known as: ZOFRAN ?Take 1 tablet (4 mg total) by mouth every 6 (six) hours as needed for nausea. ?  ?rosuvastatin 20 MG tablet ?Commonly known as: CRESTOR ?Take 1 tablet (20 mg total) by mouth daily. ?  ?[name] 7 MG Tabs ?Generic drug: Semaglutide ?Take 7 mg by mouth daily. ?  ?triamcinolone cream 0.5 % ?Commonly known as: KENALOG ?Apply 1 application. topically 2 (two) times daily. ?  ?[name] 01-999 MG Tb24 ?Generic drug: Dapagliflozin-metFORMIN HCl ER ?Take 2 tablets by mouth daily. STOP METFORMIN. ?What changed: additional instructions ?  ? ?  ? ? ?Discharge Exam: ?There were no vitals filed for this visit. ?Vitals:  ? 01/28/22 1903 01/29/22 0553  ?BP: (!) 112/59 119/74  ?Pulse: 78 76  ?Resp: 18 16  ?Temp: 98.2 ?F (36.8 ?C) 98.3 ?F (36.8 ?C)  ?SpO2: 100% 96%  ? ?Examination: ?Physical Exam: ? ?Constitutional: WN/WD obese Caucasian female currently no acute distress appears calm  and comfortable ?Respiratory: Diminished to auscultation bilaterally, no wheezing, rales, rhonchi or crackles. Normal respiratory effort and patient is not tachypenic. No accessory muscle use.  Unlabored breathing ?Cardiovascular: RRR, no murmurs / rubs / gallops. S1 and S2 auscultated. No extremity edema.  ?Abdomen: Soft, very mildly-tender, distended secondary body habitus. Bowel sounds positive.  ?GU: Deferred. ?Musculoskeletal: No clubbing / cyanosis of digits/nails. No joint deformity upper and lower extremities.  ?Neurologic: CN 2-12 grossly intact with no focal deficits. Romberg sign and cerebellar reflexes not assessed.  ?Psychiatric: Normal judgment and insight. Alert and oriented x 3. Normal mood and appropriate affect.  ? ?Condition at  discharge: stable ? ?The results of significant diagnostics from this hospitalization (including imaging, microbiology, ancillary and laboratory) are listed below for reference.  ? ?Imaging Studies: ?CT ABDOMEN PE

## 2022-01-30 ENCOUNTER — Ambulatory Visit (INDEPENDENT_AMBULATORY_CARE_PROVIDER_SITE_OTHER): Payer: Managed Care, Other (non HMO) | Admitting: Family Medicine

## 2022-01-30 VITALS — BP 104/60 | HR 80 | Temp 97.4°F | Resp 16 | Ht 60.0 in | Wt 169.4 lb

## 2022-01-30 DIAGNOSIS — E1159 Type 2 diabetes mellitus with other circulatory complications: Secondary | ICD-10-CM

## 2022-01-30 DIAGNOSIS — K5792 Diverticulitis of intestine, part unspecified, without perforation or abscess without bleeding: Secondary | ICD-10-CM | POA: Diagnosis not present

## 2022-01-30 DIAGNOSIS — E1165 Type 2 diabetes mellitus with hyperglycemia: Secondary | ICD-10-CM

## 2022-01-30 DIAGNOSIS — I152 Hypertension secondary to endocrine disorders: Secondary | ICD-10-CM

## 2022-01-30 LAB — CULTURE, BLOOD (ROUTINE X 2)
Culture: NO GROWTH
Culture: NO GROWTH
Special Requests: ADEQUATE
Special Requests: ADEQUATE

## 2022-01-30 NOTE — Progress Notes (Signed)
? ?Subjective:  ?Patient ID: Robin Bonilla, female    DOB: January 19, 1956  Age: 66 y.o. MRN: [id number] ? ?Chief Complaint  ?Patient presents with  ? Hospitalization Follow-up  ? ?HPI:  ?Patient was admitted to [facility] on 01/25/2022 and discharged on 01/29/2022. ?She was treated for Diverticulitis. ?Treatment for this included cefdinir, norco, flagyl, and zofran. ? ?She reports this condition has improved. She is still having pain.  ? ?Principal Problem: ?  Diverticulitis ?Active Problems: ?  Hyperglycemia due to type 2 diabetes mellitus (HCC) ?  Hypothyroidism (acquired) ?  Mixed diabetic hyperlipidemia associated with type 2 diabetes mellitus (HCC) ?  Hypertension associated with diabetes (HCC) ?  SIRS (systemic inflammatory response syndrome) (HCC) ? ? ?Follow up Hospitalization ?Patient initially went to urgent care for severe abdominal pain, fever, and nausea. and was concerned about diverticulitis. He called [facility] and she was taken immediately to ED room and given IVFs and CT scan of abd/pelvis. CT abdomen/pelvis with acute diverticulitis proximal sigmoid colon, no perforation, adjacent 18 mm area of mural low-density in the sigmoid colon without peripheral enhancement, possible intramural phlegmon without drainable abscess. ?--WBC 13.4>12.8>8.0>6.7 and is now 6.1 ?--Advance to soft diet yesterday ?--Discontinue IV fluids yesterday ?--Ceftriaxone 2 g IV every 24 hours and will change to p.o. cefdinir ?--Metronidazole 500 mg IV every 12 hours and will change to p.o. metronidazole for completion of her course for 10 days total ?--Norco 5-325 mg PO q6h PRN moderate pain and will give a short course for discharge ?--Morphine 2mg  q4h PRN severe pain ?--Antiemetics ?--CBC, CMP daily ?--Will need colonoscopy outpatient 6 weeks with GI, Dr. ? ?A1C 9.7.  ?Dapagliflozin-metformin 10-1000mg  PO BID, Semaglutide 7mg  PO daily.  Resume home regimen at discharge ?--Semglee 8u Badin daily ?--SSI for  coverage ?--CBG before every meal/at bedtime ?-CBGs ranging from 208-280 ? ?Essential hypertension ?--Hold home lisinopril 10 mg p.o. daily for borderline hypotension ?--Hydralazine 10mg  PO q8h PRN SBP >165 or DBP >110 ?--Continue monitor BP closely; last blood pressure reading was 119/74 ? ? ? ?  ?Current Outpatient Medications on File Prior to Visit  ?Medication Sig Dispense Refill  ? HYDROcodone-acetaminophen (NORCO/VICODIN) 5-325 MG tablet Take 1 tablet by mouth every 6 (six) hours as needed for moderate pain. 10 tablet 0  ? ondansetron (ZOFRAN) 4 MG tablet Take 1 tablet (4 mg total) by mouth every 6 (six) hours as needed for nausea. 20 tablet 0  ? ACCU-CHEK GUIDE test strip TEST STRIPS TWICE A DAY IN VITRO 30 DAYS 100 each 2  ? citalopram (CELEXA) 20 MG tablet TAKE 1 TABLET BY MOUTH EVERY DAY (Patient taking differently: Take 20 mg by mouth daily.) 90 tablet 1  ? Dapagliflozin-metFORMIN HCl ER ([name]) 01-999 MG TB24 Take 2 tablets by mouth daily. STOP METFORMIN. (Patient taking differently: Take 2 tablets by mouth daily.) 180 tablet 0  ? icosapent Ethyl (VASCEPA) 1 g capsule Take 2 capsules (2 g total) by mouth 2 (two) times daily. 360 capsule 0  ? levothyroxine (SYNTHROID) 137 MCG tablet Take 1 tablet (137 mcg total) by mouth daily before breakfast. 90 tablet 0  ? lisinopril (ZESTRIL) 10 MG tablet Take 1 tablet (10 mg total) by mouth daily. 90 tablet 1  ? rosuvastatin (CRESTOR) 20 MG tablet Take 1 tablet (20 mg total) by mouth daily. 90 tablet 1  ? Semaglutide ([name]) 7 MG TABS Take 7 mg by mouth daily. 30 tablet 0  ? triamcinolone cream (KENALOG) 0.5 % Apply  1 application. topically 2 (two) times daily.    ? ?No current facility-administered medications on file prior to visit.  ? ?Past Medical History:  ?Diagnosis Date  ? Acute sinusitis   ? Depression   ? Diabetes mellitus 2005  ? Dysthymic disorder   ? Hirsutism   ? Hyperlipidemia   ? Rash, skin   ? Thyroid disease   ? ?Past Surgical History:   ?Procedure Laterality Date  ? BACK SURGERY    ? CHOLECYSTECTOMY  05/30/11  ? PARTIAL HYSTERECTOMY    ? TONSILLECTOMY    ? TUBAL LIGATION    ?  ?Family History  ?Problem Relation Age of Onset  ? Breast cancer Mother 51  ? CAD Father   ? CAD Maternal Grandmother   ? Hypertension Maternal Grandmother   ? Diabetes Paternal Grandfather   ? Diabetes Paternal Aunt   ? ?Social History  ? ?Socioeconomic History  ? Marital status: Married  ?  Spouse name: Not on file  ? Number of children: Not on file  ? Years of education: Not on file  ? Highest education level: Not on file  ?Occupational History  ? Not on file  ?Tobacco Use  ? Smoking status: Never  ? Smokeless tobacco: Never  ?Substance and Sexual Activity  ? Alcohol use: No  ?  Alcohol/week: 0.0 standard drinks  ? Drug use: No  ? Sexual activity: Not on file  ?Other Topics Concern  ? Not on file  ?Social History Narrative  ? Not on file  ? ?Social Determinants of Health  ? ?Financial Resource Strain: Not on file  ?Food Insecurity: Not on file  ?Transportation Needs: Not on file  ?Physical Activity: Not on file  ?Stress: Not on file  ?Social Connections: Not on file  ? ? ?Review of Systems  ?Constitutional:  Positive for fatigue. Negative for appetite change and fever.  ?HENT:  Negative for congestion, ear pain, sinus pressure and sore throat.   ?Respiratory:  Negative for cough, chest tightness, shortness of breath and wheezing.   ?Cardiovascular:  Negative for chest pain and palpitations.  ?Gastrointestinal:  Positive for abdominal pain (left sided abdominal discomfort), diarrhea and nausea. Negative for constipation and vomiting.  ?Genitourinary:  Negative for dysuria and hematuria.  ?Neurological:  Positive for weakness. Negative for dizziness and headaches.  ?Psychiatric/Behavioral:  Negative for dysphoric mood. The patient is not nervous/anxious and is not hyperactive.   ? ? ?Objective:  ?BP 104/60   Pulse 80   Temp (!) 97.4 ?F (36.3 ?C)   Resp 16   Ht 5' (1.524  m)   Wt 169 lb 6.4 oz (76.8 kg)   BMI 33.08 kg/m?  ? ? ?  02/06/2022  ?  9:23 AM 01/30/2022  ? 10:33 AM 01/29/2022  ?  5:53 AM  ?BP/Weight  ?Systolic BP 100 104 119  ?Diastolic BP 60 60 74  ?Wt. (Lbs) 170 169.4   ?BMI 33.2 kg/m2 33.08 kg/m2   ? ? ?Physical Exam ?Vitals reviewed.  ?Constitutional:   ?   Appearance: Normal appearance. She is normal weight.  ?Cardiovascular:  ?   Rate and Rhythm: Normal rate and regular rhythm.  ?   Heart sounds: Normal heart sounds.  ?Pulmonary:  ?   Effort: Pulmonary effort is normal.  ?   Breath sounds: Normal breath sounds.  ?Abdominal:  ?   General: Abdomen is flat. Bowel sounds are normal.  ?   Palpations: Abdomen is soft.  ?   Tenderness: There  is abdominal tenderness (LLQ. mild.).  ?Neurological:  ?   Mental Status: She is alert and oriented to person, place, and time.  ?Psychiatric:     ?   Mood and Affect: Mood normal.     ?   Behavior: Behavior normal.  ? ? ?Diabetic Foot Exam - Simple   ?No data filed ?  ?  ? ?Lab Results  ?Component Value Date  ? WBC 11.7 (H) 02/06/2022  ? HGB 14.6 02/06/2022  ? HCT 45.0 02/06/2022  ? PLT 254 02/06/2022  ? GLUCOSE 295 (H) 02/06/2022  ? CHOL 114 12/27/2021  ? TRIG 156 (H) 12/27/2021  ? HDL 33 (L) 12/27/2021  ? LDLCALC 54 12/27/2021  ? ALT 37 (H) 02/06/2022  ? AST 21 02/06/2022  ? NA 139 02/06/2022  ? K 4.6 02/06/2022  ? CL 98 02/06/2022  ? CREATININE 0.76 02/06/2022  ? BUN 11 02/06/2022  ? CO2 21 02/06/2022  ? TSH 1.250 09/26/2021  ? HGBA1C 9.7 (H) 12/27/2021  ? MICROALBUR 80 09/26/2021  ? ? ? ? ?Assessment & Plan:  ? ?Problem List Items Addressed This Visit   ? ?  ? Cardiovascular and Mediastinum  ? Hypertension associated with diabetes (HCC)  ?  Continue to hold lisinopril 10 mg p.o. daily for borderline hypotension ?Parameters: Hydralazine 10mg  PO q8h PRN SBP >165 or DBP >110. ?Continue to monitor bp daily.  ? ? ?  ?  ?  ? Endocrine  ? Hyperglycemia due to type 2 diabetes mellitus (HCC)  ?  Continue Dapagliflozin-metformin 10-1000mg  PO BID,  Semaglutide 7mg  PO daily.  ?Continue to check sugars before meals and before bed.  ? ?  ?  ?  ? Other  ? Diverticulitis - Primary  ?  Complete antibiotics (cefdinir and flagyl.) ? ?  ?  ?  ?Put out of work for 2 mor

## 2022-02-03 ENCOUNTER — Telehealth: Payer: Self-pay

## 2022-02-03 NOTE — Telephone Encounter (Signed)
Patient notified.  She is unable to come into the office until Thursday.  Follow-up appointment scheduled.   ?

## 2022-02-03 NOTE — Telephone Encounter (Signed)
Husband calling as patient is having diarrhea. She has tried OTC medication for this but is not helping. He feels everything is going "straight through her." Requesting script to help.  ? ?Robin Bonilla 02/03/22 3:11 PM ? ?

## 2022-02-06 ENCOUNTER — Encounter: Payer: Self-pay | Admitting: Family Medicine

## 2022-02-06 ENCOUNTER — Ambulatory Visit (INDEPENDENT_AMBULATORY_CARE_PROVIDER_SITE_OTHER): Payer: Managed Care, Other (non HMO) | Admitting: Family Medicine

## 2022-02-06 VITALS — BP 100/60 | HR 106 | Temp 98.4°F | Resp 15 | Ht 60.0 in | Wt 170.0 lb

## 2022-02-06 DIAGNOSIS — R197 Diarrhea, unspecified: Secondary | ICD-10-CM | POA: Diagnosis not present

## 2022-02-06 NOTE — Progress Notes (Signed)
Acute Office Visit  Subjective:    Patient ID: Robin Bonilla, female    DOB: April 16, 1956, 66 y.o.   MRN: [id number]  Chief Complaint  Patient presents with   Diarrhea    HPI: Patient is in today for diarrhea since 8 days ago after she was discharge from [facility] on 01/25/2022. She mentioned cramping pain on and off, nauseas. She noticed that BM is every time that she eats. She is taking imodium and nausea medication, zofran. Patient was seen at the office on 01/30/2022 for hospital follow up with diagnosis of diverticulitis. Completed antibiotics: cipro and flagyl. Having Bms after meals and food is going right through her. Not having abdominal pain.  Able take fluids.  Sugars 180-200. Has kept taking xigduo xr and rybelsus.   Past Medical History:  Diagnosis Date   Acute sinusitis    Depression    Diabetes mellitus 2005   Dysthymic disorder    Hirsutism    Hyperlipidemia    Rash, skin    Thyroid disease     Past Surgical History:  Procedure Laterality Date   BACK SURGERY     CHOLECYSTECTOMY  05/30/11   PARTIAL HYSTERECTOMY     TONSILLECTOMY     TUBAL LIGATION      Family History  Problem Relation Age of Onset   Breast cancer Mother 40   CAD Father    CAD Maternal Grandmother    Hypertension Maternal Grandmother    Diabetes Paternal Grandfather    Diabetes Paternal Aunt     Social History   Socioeconomic History   Marital status: Married    Spouse name: Not on file   Number of children: Not on file   Years of education: Not on file   Highest education level: Not on file  Occupational History   Not on file  Tobacco Use   Smoking status: Never   Smokeless tobacco: Never  Substance and Sexual Activity   Alcohol use: No    Alcohol/week: 0.0 standard drinks   Drug use: No   Sexual activity: Not on file  Other Topics Concern   Not on file  Social History Narrative   Not on file   Social Determinants of Health   Financial Resource  Strain: Not on file  Food Insecurity: Not on file  Transportation Needs: Not on file  Physical Activity: Not on file  Stress: Not on file  Social Connections: Not on file  Intimate Partner Violence: Not on file    Outpatient Medications Prior to Visit  Medication Sig Dispense Refill   ACCU-CHEK GUIDE test strip TEST STRIPS TWICE A DAY IN VITRO 30 DAYS 100 each 2   citalopram (CELEXA) 20 MG tablet TAKE 1 TABLET BY MOUTH EVERY DAY (Patient taking differently: Take 20 mg by mouth daily.) 90 tablet 1   Dapagliflozin-metFORMIN HCl ER ([name]) 01-999 MG TB24 Take 2 tablets by mouth daily. STOP METFORMIN. (Patient taking differently: Take 2 tablets by mouth daily.) 180 tablet 0   HYDROcodone-acetaminophen (NORCO/VICODIN) 5-325 MG tablet Take 1 tablet by mouth every 6 (six) hours as needed for moderate pain. (Patient not taking: Reported on 02/25/2022) 10 tablet 0   icosapent Ethyl (VASCEPA) 1 g capsule Take 2 capsules (2 g total) by mouth 2 (two) times daily. 360 capsule 0   levothyroxine (SYNTHROID) 137 MCG tablet Take 1 tablet (137 mcg total) by mouth daily before breakfast. 90 tablet 0   lisinopril (ZESTRIL) 10 MG tablet Take 1  tablet (10 mg total) by mouth daily. 90 tablet 1   ondansetron (ZOFRAN) 4 MG tablet Take 1 tablet (4 mg total) by mouth every 6 (six) hours as needed for nausea. 20 tablet 0   rosuvastatin (CRESTOR) 20 MG tablet Take 1 tablet (20 mg total) by mouth daily. 90 tablet 1   triamcinolone cream (KENALOG) 0.5 % Apply 1 application. topically 2 (two) times daily.     Semaglutide ([name]) 7 MG TABS Take 7 mg by mouth daily. 30 tablet 0   No facility-administered medications prior to visit.    Allergies  Allergen Reactions   Lunesta [Eszopiclone] Other (See Comments)    Fatigue.    Penicillins Rash   Simvastatin Other (See Comments)    nightmares     Review of Systems  Constitutional:  Negative for chills, fatigue and fever.  HENT:  Negative for congestion, ear pain  and sore throat.   Respiratory:  Negative for cough and shortness of breath.   Cardiovascular:  Negative for chest pain and palpitations.  Gastrointestinal:  Positive for abdominal pain, diarrhea and nausea. Negative for constipation and vomiting.  Endocrine: Negative for polydipsia, polyphagia and polyuria.  Genitourinary:  Negative for difficulty urinating and dysuria.  Musculoskeletal:  Negative for arthralgias, back pain and myalgias.  Skin:  Negative for rash.  Neurological:  Negative for headaches.  Psychiatric/Behavioral:  Negative for dysphoric mood. The patient is not nervous/anxious.       Objective:    Physical Exam Vitals reviewed.  Constitutional:      Appearance: Normal appearance. She is obese.  Neck:     Vascular: No carotid bruit.  Cardiovascular:     Rate and Rhythm: Normal rate and regular rhythm.     Heart sounds: Normal heart sounds.  Pulmonary:     Effort: Pulmonary effort is normal. No respiratory distress.     Breath sounds: Normal breath sounds.  Abdominal:     General: Abdomen is flat. Bowel sounds are normal.     Palpations: Abdomen is soft.     Tenderness: There is abdominal tenderness (moderate tenderness).  Neurological:     Mental Status: She is alert and oriented to person, place, and time.  Psychiatric:        Mood and Affect: Mood normal.        Behavior: Behavior normal.    BP 100/60   Pulse (!) 106   Temp 98.4 F (36.9 C)   Resp 15   Ht 5' (1.524 m)   Wt 170 lb (77.1 kg)   SpO2 98%   BMI 33.20 kg/m  Wt Readings from Last 3 Encounters:  02/25/22 167 lb (75.8 kg)  02/12/22 168 lb (76.2 kg)  02/06/22 170 lb (77.1 kg)    Health Maintenance Due  Topic Date Due   COVID-19 Vaccine (1) Never done   TETANUS/TDAP  Never done   PAP SMEAR-Modifier  Never done   Zoster Vaccines- Shingrix (1 of 2) Never done   OPHTHALMOLOGY EXAM  01/03/2022    There are no preventive care reminders to display for this patient.   Lab Results   Component Value Date   TSH 1.250 09/26/2021   Lab Results  Component Value Date   WBC 11.7 (H) 02/06/2022   HGB 14.6 02/06/2022   HCT 45.0 02/06/2022   MCV 84 02/06/2022   PLT 254 02/06/2022   Lab Results  Component Value Date   NA 139 02/06/2022   K 4.6 02/06/2022   CO2 21  02/06/2022   GLUCOSE 295 (H) 02/06/2022   BUN 11 02/06/2022   CREATININE 0.76 02/06/2022   BILITOT 1.0 02/06/2022   ALKPHOS 79 02/06/2022   AST 21 02/06/2022   ALT 37 (H) 02/06/2022   PROT 7.2 02/06/2022   ALBUMIN 4.2 02/06/2022   CALCIUM 9.7 02/06/2022   ANIONGAP 10 01/29/2022   EGFR 87 02/06/2022   GFR 70.16 11/16/2015   Lab Results  Component Value Date   CHOL 114 12/27/2021   Lab Results  Component Value Date   HDL 33 (L) 12/27/2021   Lab Results  Component Value Date   LDLCALC 54 12/27/2021   Lab Results  Component Value Date   TRIG 156 (H) 12/27/2021   Lab Results  Component Value Date   CHOLHDL 3.5 12/27/2021   Lab Results  Component Value Date   HGBA1C 9.7 (H) 12/27/2021       Assessment & Plan:   Problem List Items Addressed This Visit       Digestive   Diarrhea of presumed infectious origin - Primary    Recommend probiotic (align or Flora q). Check labs. Stool studies were ordered.      Relevant Orders   CBC with Differential/Platelet (Completed)   Comprehensive metabolic panel (Completed)   Cdiff NAA+O+P+Stool Culture (Completed)   No orders of the defined types were placed in this encounter.   Orders Placed This Encounter  Procedures   Cdiff NAA+O+P+Stool Culture   CBC with Differential/Platelet   Comprehensive metabolic panel    I,Kamarii Buren,acting as a scribe for [name], MD.,have documented all relevant documentation on the behalf of [name], MD,as directed by  [name], MD while in the presence of [name], MD.   Follow-up: Return in about 2 weeks (around 02/20/2022).  An After Visit Summary was printed and given to the  patient.  [name], MD [facility] [phone]

## 2022-02-06 NOTE — Patient Instructions (Signed)
Recommend probiotic (align or Flora q) ?

## 2022-02-07 LAB — COMPREHENSIVE METABOLIC PANEL
ALT: 37 IU/L — ABNORMAL HIGH (ref 0–32)
AST: 21 IU/L (ref 0–40)
Albumin/Globulin Ratio: 1.4 (ref 1.2–2.2)
Albumin: 4.2 g/dL (ref 3.8–4.8)
Alkaline Phosphatase: 79 IU/L (ref 44–121)
BUN/Creatinine Ratio: 14 (ref 12–28)
BUN: 11 mg/dL (ref 8–27)
Bilirubin Total: 1 mg/dL (ref 0.0–1.2)
CO2: 21 mmol/L (ref 20–29)
Calcium: 9.7 mg/dL (ref 8.7–10.3)
Chloride: 98 mmol/L (ref 96–106)
Creatinine, Ser: 0.76 mg/dL (ref 0.57–1.00)
Globulin, Total: 3 g/dL (ref 1.5–4.5)
Glucose: 295 mg/dL — ABNORMAL HIGH (ref 70–99)
Potassium: 4.6 mmol/L (ref 3.5–5.2)
Sodium: 139 mmol/L (ref 134–144)
Total Protein: 7.2 g/dL (ref 6.0–8.5)
eGFR: 87 mL/min/{1.73_m2} (ref >59)

## 2022-02-07 LAB — CBC WITH DIFFERENTIAL/PLATELET
Basophils Absolute: 0.1 10*3/uL (ref 0.0–0.2)
Basos: 1 %
EOS (ABSOLUTE): 0.4 10*3/uL (ref 0.0–0.4)
Eos: 3 %
Hematocrit: 45 % (ref 34.0–46.6)
Hemoglobin: 14.6 g/dL (ref 11.1–15.9)
Immature Grans (Abs): 0 10*3/uL (ref 0.0–0.1)
Immature Granulocytes: 0 %
Lymphocytes Absolute: 2.9 10*3/uL (ref 0.7–3.1)
Lymphs: 24 %
MCH: 27.1 pg (ref 26.6–33.0)
MCHC: 32.4 g/dL (ref 31.5–35.7)
MCV: 84 fL (ref 79–97)
Monocytes Absolute: 0.6 10*3/uL (ref 0.1–0.9)
Monocytes: 5 %
Neutrophils Absolute: 7.7 10*3/uL — ABNORMAL HIGH (ref 1.4–7.0)
Neutrophils: 67 %
Platelets: 254 10*3/uL (ref 150–450)
RBC: 5.39 x10E6/uL — ABNORMAL HIGH (ref 3.77–5.28)
RDW: 13.6 % (ref 11.7–15.4)
WBC: 11.7 10*3/uL — ABNORMAL HIGH (ref 3.4–10.8)

## 2022-02-08 ENCOUNTER — Other Ambulatory Visit: Payer: Self-pay | Admitting: Family Medicine

## 2022-02-08 MED ORDER — CIPROFLOXACIN HCL 500 MG PO TABS: 500.0000 mg | ORAL_TABLET | Freq: Two times a day (BID) | ORAL | 0 refills | Status: AC

## 2022-02-08 MED ORDER — METRONIDAZOLE 500 MG PO TABS: 500.0000 mg | ORAL_TABLET | Freq: Two times a day (BID) | ORAL | 0 refills | Status: AC

## 2022-02-09 ENCOUNTER — Encounter: Payer: Self-pay | Admitting: Family Medicine

## 2022-02-09 NOTE — Assessment & Plan Note (Signed)
Continue to hold lisinopril 10 mg p.o. daily for borderline hypotension ?Parameters: Hydralazine 10mg  PO q8h PRN SBP >165 or DBP >110. ?Continue to monitor bp daily.  ? ?

## 2022-02-09 NOTE — Assessment & Plan Note (Signed)
Continue Dapagliflozin-metformin 10-1000mg  PO BID, Semaglutide 7mg  PO daily.  ?Continue to check sugars before meals and before bed.  ?

## 2022-02-09 NOTE — Assessment & Plan Note (Signed)
Complete antibiotics (cefdinir and flagyl.) ?

## 2022-02-12 ENCOUNTER — Ambulatory Visit: Payer: Managed Care, Other (non HMO) | Admitting: Family Medicine

## 2022-02-12 VITALS — BP 106/60 | HR 100 | Temp 97.1°F | Resp 18 | Ht 60.0 in | Wt 168.0 lb

## 2022-02-12 DIAGNOSIS — K5792 Diverticulitis of intestine, part unspecified, without perforation or abscess without bleeding: Secondary | ICD-10-CM | POA: Diagnosis not present

## 2022-02-12 DIAGNOSIS — R1032 Left lower quadrant pain: Secondary | ICD-10-CM | POA: Diagnosis not present

## 2022-02-12 DIAGNOSIS — E1165 Type 2 diabetes mellitus with hyperglycemia: Secondary | ICD-10-CM

## 2022-02-12 NOTE — Progress Notes (Signed)
Subjective:  Patient ID: Robin Bonilla, female    DOB: 10-20-1955  Age: 66 y.o. MRN: [id number]  Chief Complaint  Patient presents with   Abdominal Pain    HPI Patient returns for LLQ abdominal tenderness, nausea, and still having diarrhea. Diarrhea has lessened a little. No bloddy or black stools, but a little dark. No fever. Based on elevated wbc and LLQ abdominal pain, I started cipro and flagyl.  Recent hospitalziation for diverticultiis two weeks ago.    Current Outpatient Medications on File Prior to Visit  Medication Sig Dispense Refill   ACCU-CHEK GUIDE test strip TEST STRIPS TWICE A DAY IN VITRO 30 DAYS 100 each 2   citalopram (CELEXA) 20 MG tablet TAKE 1 TABLET BY MOUTH EVERY DAY (Patient taking differently: Take 20 mg by mouth daily.) 90 tablet 1   Dapagliflozin-metFORMIN HCl ER ([name]) 01-999 MG TB24 Take 2 tablets by mouth daily. STOP METFORMIN. (Patient taking differently: Take 2 tablets by mouth daily.) 180 tablet 0   HYDROcodone-acetaminophen (NORCO/VICODIN) 5-325 MG tablet Take 1 tablet by mouth every 6 (six) hours as needed for moderate pain. (Patient not taking: Reported on 02/25/2022) 10 tablet 0   icosapent Ethyl (VASCEPA) 1 g capsule Take 2 capsules (2 g total) by mouth 2 (two) times daily. 360 capsule 0   levothyroxine (SYNTHROID) 137 MCG tablet Take 1 tablet (137 mcg total) by mouth daily before breakfast. 90 tablet 0   lisinopril (ZESTRIL) 10 MG tablet Take 1 tablet (10 mg total) by mouth daily. 90 tablet 1   ondansetron (ZOFRAN) 4 MG tablet Take 1 tablet (4 mg total) by mouth every 6 (six) hours as needed for nausea. 20 tablet 0   rosuvastatin (CRESTOR) 20 MG tablet Take 1 tablet (20 mg total) by mouth daily. 90 tablet 1   triamcinolone cream (KENALOG) 0.5 % Apply 1 application. topically 2 (two) times daily.     No current facility-administered medications on file prior to visit.   Past Medical History:  Diagnosis Date   Acute sinusitis     Depression    Diabetes mellitus 2005   Dysthymic disorder    Hirsutism    Hyperlipidemia    Rash, skin    Thyroid disease    Past Surgical History:  Procedure Laterality Date   BACK SURGERY     CHOLECYSTECTOMY  05/30/11   PARTIAL HYSTERECTOMY     TONSILLECTOMY     TUBAL LIGATION      Family History  Problem Relation Age of Onset   Breast cancer Mother 41   CAD Father    CAD Maternal Grandmother    Hypertension Maternal Grandmother    Diabetes Paternal Grandfather    Diabetes Paternal Aunt    Social History   Socioeconomic History   Marital status: Married    Spouse name: Not on file   Number of children: Not on file   Years of education: Not on file   Highest education level: Not on file  Occupational History   Not on file  Tobacco Use   Smoking status: Never   Smokeless tobacco: Never  Substance and Sexual Activity   Alcohol use: No    Alcohol/week: 0.0 standard drinks   Drug use: No   Sexual activity: Not on file  Other Topics Concern   Not on file  Social History Narrative   Not on file   Social Determinants of Health   Financial Resource Strain: Not on file  Food Insecurity: Not on  file  Transportation Needs: Not on file  Physical Activity: Not on file  Stress: Not on file  Social Connections: Not on file    Review of Systems  Constitutional:  Negative for chills and fever.  Gastrointestinal:  Positive for abdominal pain, diarrhea and nausea.  Neurological:  Negative for dizziness and headaches.    Objective:  BP 106/60   Pulse 100   Temp (!) 97.1 F (36.2 C)   Resp 18   Ht 5' (1.524 m)   Wt 168 lb (76.2 kg)   BMI 32.81 kg/m      02/25/2022   10:57 AM 02/12/2022   11:15 AM 02/06/2022    9:23 AM  BP/Weight  Systolic BP 128 106 100  Diastolic BP 60 60 60  Wt. (Lbs) 167 168 170  BMI 32.61 kg/m2 32.81 kg/m2 33.2 kg/m2    Physical Exam Vitals reviewed.  Constitutional:      Appearance: Normal appearance. She is well-developed and  normal weight.  Neck:     Vascular: No carotid bruit.  Cardiovascular:     Rate and Rhythm: Normal rate and regular rhythm.     Heart sounds: Normal heart sounds.  Pulmonary:     Effort: Pulmonary effort is normal. No respiratory distress.     Breath sounds: Normal breath sounds.  Abdominal:     General: Abdomen is flat. Bowel sounds are normal.     Palpations: Abdomen is soft.     Tenderness: There is abdominal tenderness (moderate) in the left lower quadrant.  Neurological:     Mental Status: She is alert and oriented to person, place, and time.  Psychiatric:        Mood and Affect: Mood normal.        Behavior: Behavior normal.    Diabetic Foot Exam - Simple   No data filed      Lab Results  Component Value Date   WBC 11.7 (H) 02/06/2022   HGB 14.6 02/06/2022   HCT 45.0 02/06/2022   PLT 254 02/06/2022   GLUCOSE 295 (H) 02/06/2022   CHOL 114 12/27/2021   TRIG 156 (H) 12/27/2021   HDL 33 (L) 12/27/2021   LDLCALC 54 12/27/2021   ALT 37 (H) 02/06/2022   AST 21 02/06/2022   NA 139 02/06/2022   K 4.6 02/06/2022   CL 98 02/06/2022   CREATININE 0.76 02/06/2022   BUN 11 02/06/2022   CO2 21 02/06/2022   TSH 1.250 09/26/2021   HGBA1C 9.7 (H) 12/27/2021   MICROALBUR 80 09/26/2021      Assessment & Plan:   Problem List Items Addressed This Visit       Other   Diverticulitis    CT abdomen and pelvis w contrast STAT was ordered.        Relevant Orders   CT ABDOMEN PELVIS W CONTRAST   Left lower quadrant abdominal pain - Primary    CT was ordered. Check labs.       Relevant Orders   CBC with Differential/Platelet   Comprehensive metabolic panel   CT ABDOMEN PELVIS W CONTRAST  .  No orders of the defined types were placed in this encounter.   Orders Placed This Encounter  Procedures   CT ABDOMEN PELVIS W CONTRAST   CBC with Differential/Platelet   Comprehensive metabolic panel   Follow-up: Return in about 2 weeks (around 02/26/2022).  An After  Visit Summary was printed and given to the patient.  Blane Ohara, MD [facility] (  [phone] 

## 2022-02-14 LAB — CDIFF NAA+O+P+STOOL CULTURE
E coli, Shiga toxin Assay: NEGATIVE
Toxigenic C. Difficile by PCR: NEGATIVE

## 2022-02-15 DIAGNOSIS — R1032 Left lower quadrant pain: Secondary | ICD-10-CM | POA: Insufficient documentation

## 2022-02-15 NOTE — Assessment & Plan Note (Signed)
CT was ordered. Check labs.

## 2022-02-15 NOTE — Assessment & Plan Note (Signed)
CT abdomen and pelvis w contrast STAT was ordered.

## 2022-02-20 NOTE — Progress Notes (Signed)
Subjective:  Patient ID: Robin Bonilla, female    DOB: 04-03-1956  Age: 66 y.o. MRN: [id number]  Chief Complaint  Patient presents with   Diverticulitis    2 week follow up    Patient returns for follow up diverticulitis. Completed antibiotics.  Patient returns for LLQ abdominal tenderness, nausea, and still having diarrhea. Diarrhea has lessened a little. No bloody or black stools, but a little dark. No fever.  Finished antibiotics. Still using immodium every other day.   Diabetes: sugars 180-220. [name] 7 mg daily, xigduo 01/999 mg 2 daily.  HPI has been reviewed and updated.    Current Outpatient Medications on File Prior to Visit  Medication Sig Dispense Refill   icosapent Ethyl (VASCEPA) 1 g capsule Take 2 capsules (2 g total) by mouth 2 (two) times daily. 360 capsule 0   levothyroxine (SYNTHROID) 137 MCG tablet Take 1 tablet (137 mcg total) by mouth daily before breakfast. 90 tablet 0   lisinopril (ZESTRIL) 10 MG tablet Take 1 tablet (10 mg total) by mouth daily. 90 tablet 1   ondansetron (ZOFRAN) 4 MG tablet Take 1 tablet (4 mg total) by mouth every 6 (six) hours as needed for nausea. 20 tablet 0   rosuvastatin (CRESTOR) 20 MG tablet Take 1 tablet (20 mg total) by mouth daily. 90 tablet 1   triamcinolone cream (KENALOG) 0.5 % Apply 1 application. topically 2 (two) times daily.     ACCU-CHEK GUIDE test strip TEST STRIPS TWICE A DAY IN VITRO 30 DAYS 100 each 2   citalopram (CELEXA) 20 MG tablet TAKE 1 TABLET BY MOUTH EVERY DAY (Patient taking differently: Take 20 mg by mouth daily.) 90 tablet 1   Dapagliflozin-metFORMIN HCl ER ([name]) 01-999 MG TB24 Take 2 tablets by mouth daily. STOP METFORMIN. (Patient taking differently: Take 2 tablets by mouth daily.) 180 tablet 0   HYDROcodone-acetaminophen (NORCO/VICODIN) 5-325 MG tablet Take 1 tablet by mouth every 6 (six) hours as needed for moderate pain. (Patient not taking: Reported on 02/25/2022) 10 tablet 0   No current  facility-administered medications on file prior to visit.   Past Medical History:  Diagnosis Date   Acute sinusitis    Depression    Diabetes mellitus 2005   Dysthymic disorder    Hirsutism    Hyperlipidemia    Rash, skin    Thyroid disease    Past Surgical History:  Procedure Laterality Date   BACK SURGERY     CHOLECYSTECTOMY  05/30/11   PARTIAL HYSTERECTOMY     TONSILLECTOMY     TUBAL LIGATION      Family History  Problem Relation Age of Onset   Breast cancer Mother 1870   CAD Father    CAD Maternal Grandmother    Hypertension Maternal Grandmother    Diabetes Paternal Grandfather    Diabetes Paternal Aunt    Social History   Socioeconomic History   Marital status: Married    Spouse name: Not on file   Number of children: Not on file   Years of education: Not on file   Highest education level: Not on file  Occupational History   Not on file  Tobacco Use   Smoking status: Never   Smokeless tobacco: Never  Substance and Sexual Activity   Alcohol use: No    Alcohol/week: 0.0 standard drinks   Drug use: No   Sexual activity: Not on file  Other Topics Concern   Not on file  Social History Narrative  Not on file   Social Determinants of Health   Financial Resource Strain: Not on file  Food Insecurity: Not on file  Transportation Needs: Not on file  Physical Activity: Not on file  Stress: Not on file  Social Connections: Not on file    Review of Systems  Constitutional:  Positive for fatigue. Negative for appetite change and fever.  HENT:  Negative for congestion, ear pain, sinus pressure and sore throat.   Respiratory:  Negative for cough, chest tightness, shortness of breath and wheezing.   Cardiovascular:  Negative for chest pain and palpitations.  Gastrointestinal:  Positive for diarrhea and nausea. Negative for abdominal pain, constipation and vomiting.  Genitourinary:  Negative for dysuria and hematuria.  Musculoskeletal:  Negative for arthralgias,  back pain, joint swelling and myalgias.  Skin:  Negative for rash.  Neurological:  Negative for dizziness, weakness and headaches.  Psychiatric/Behavioral:  Negative for dysphoric mood. The patient is not nervous/anxious.     Objective:  BP 128/60   Pulse 88   Temp (!) 97.1 F (36.2 C)   Resp 16   Ht 5' (1.524 m)   Wt 167 lb (75.8 kg)   BMI 32.61 kg/m      02/25/2022   10:57 AM 02/12/2022   11:15 AM 02/06/2022    9:23 AM  BP/Weight  Systolic BP 128 106 100  Diastolic BP 60 60 60  Wt. (Lbs) 167 168 170  BMI 32.61 kg/m2 32.81 kg/m2 33.2 kg/m2    Physical Exam Vitals reviewed.  Constitutional:      Appearance: Normal appearance. She is normal weight.  Cardiovascular:     Rate and Rhythm: Normal rate and regular rhythm.     Heart sounds: Normal heart sounds.  Pulmonary:     Effort: Pulmonary effort is normal.     Breath sounds: Normal breath sounds.  Abdominal:     General: Abdomen is flat. Bowel sounds are normal.     Palpations: Abdomen is soft.     Tenderness: There is abdominal tenderness (very mild generalized.).  Neurological:     Mental Status: She is alert and oriented to person, place, and time.  Psychiatric:        Mood and Affect: Mood normal.        Behavior: Behavior normal.    Diabetic Foot Exam - Simple   No data filed      Lab Results  Component Value Date   WBC 11.7 (H) 02/06/2022   HGB 14.6 02/06/2022   HCT 45.0 02/06/2022   PLT 254 02/06/2022   GLUCOSE 295 (H) 02/06/2022   CHOL 114 12/27/2021   TRIG 156 (H) 12/27/2021   HDL 33 (L) 12/27/2021   LDLCALC 54 12/27/2021   ALT 37 (H) 02/06/2022   AST 21 02/06/2022   NA 139 02/06/2022   K 4.6 02/06/2022   CL 98 02/06/2022   CREATININE 0.76 02/06/2022   BUN 11 02/06/2022   CO2 21 02/06/2022   TSH 1.250 09/26/2021   HGBA1C 9.7 (H) 12/27/2021   MICROALBUR 80 09/26/2021      Assessment & Plan:   Problem List Items Addressed This Visit       Other   Diverticulitis    Resolved.  Recommend probiotic.       Left lower quadrant abdominal pain - Primary    Probiotic.       .  Meds ordered this encounter  Medications   Semaglutide ([name]) 7 MG TABS    Sig:  Take 7 mg by mouth daily.    Dispense:  30 tablet    Refill:  3   Follow-up: Return in about 6 weeks (around 04/08/2022) for chronic fasting.  An After Visit Summary was printed and given to the patient.  I,Lauren M Auman,acting as a scribe for Blane Ohara, MD.,have documented all relevant documentation on the behalf of Blane Ohara, MD,as directed by  Blane Ohara, MD while in the presence of Blane Ohara, MD.   Blane Ohara, MD [facility] [phone]

## 2022-02-23 DIAGNOSIS — R197 Diarrhea, unspecified: Secondary | ICD-10-CM | POA: Insufficient documentation

## 2022-02-23 NOTE — Assessment & Plan Note (Signed)
Recommend probiotic (align or Flora q). Check labs. Stool studies were ordered.

## 2022-02-25 ENCOUNTER — Ambulatory Visit: Payer: Managed Care, Other (non HMO) | Admitting: Family Medicine

## 2022-02-25 VITALS — BP 128/60 | HR 88 | Temp 97.1°F | Resp 16 | Ht 60.0 in | Wt 167.0 lb

## 2022-02-25 DIAGNOSIS — R1032 Left lower quadrant pain: Secondary | ICD-10-CM | POA: Diagnosis not present

## 2022-02-25 DIAGNOSIS — K5792 Diverticulitis of intestine, part unspecified, without perforation or abscess without bleeding: Secondary | ICD-10-CM

## 2022-02-25 MED ORDER — RYBELSUS 7 MG PO TABS: 7.0000 mg | ORAL_TABLET | Freq: Every day | ORAL | 3 refills | Status: DC

## 2022-03-02 ENCOUNTER — Encounter: Payer: Self-pay | Admitting: Family Medicine

## 2022-03-03 ENCOUNTER — Encounter: Payer: Self-pay | Admitting: Family Medicine

## 2022-03-03 NOTE — Assessment & Plan Note (Signed)
Probiotic

## 2022-03-03 NOTE — Assessment & Plan Note (Addendum)
Resolved. Recommend probiotic.

## 2022-03-08 ENCOUNTER — Other Ambulatory Visit: Payer: Self-pay | Admitting: Family Medicine

## 2022-03-13 ENCOUNTER — Other Ambulatory Visit: Payer: Self-pay

## 2022-03-13 DIAGNOSIS — R1032 Left lower quadrant pain: Secondary | ICD-10-CM

## 2022-03-13 DIAGNOSIS — K5792 Diverticulitis of intestine, part unspecified, without perforation or abscess without bleeding: Secondary | ICD-10-CM

## 2022-03-23 ENCOUNTER — Other Ambulatory Visit: Payer: Self-pay | Admitting: Family Medicine

## 2022-04-04 IMAGING — MG MM DIGITAL SCREENING BILAT W/ TOMO AND CAD
6 of 10 series · 6 of 30 positions shown · non-contrast
Comparison: Previous exam(s).

CLINICAL DATA: Screening.

EXAM:
DIGITAL SCREENING BILATERAL MAMMOGRAM WITH TOMOSYNTHESIS AND CAD
TECHNIQUE: Bilateral screening digital craniocaudal and mediolateral oblique
mammograms were obtained. Bilateral screening digital breast
tomosynthesis was performed. The images were evaluated with
computer-aided detection.

[L CC synth-2D]
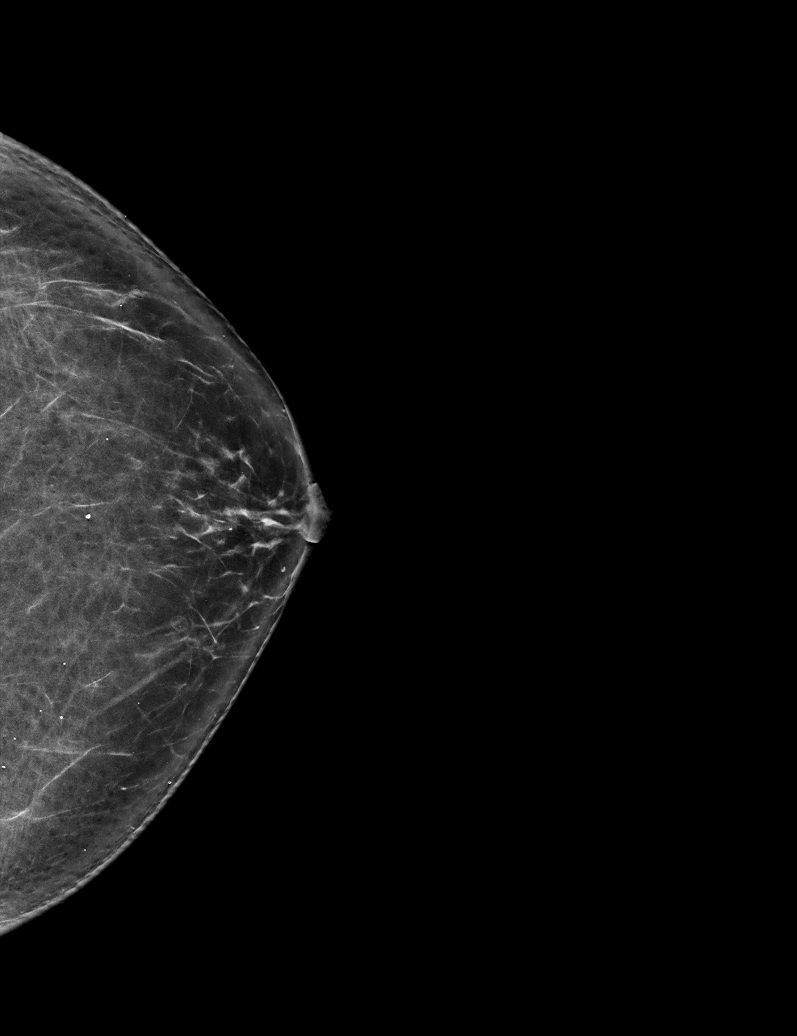

[R MLO synth-2D (1 of 2)]
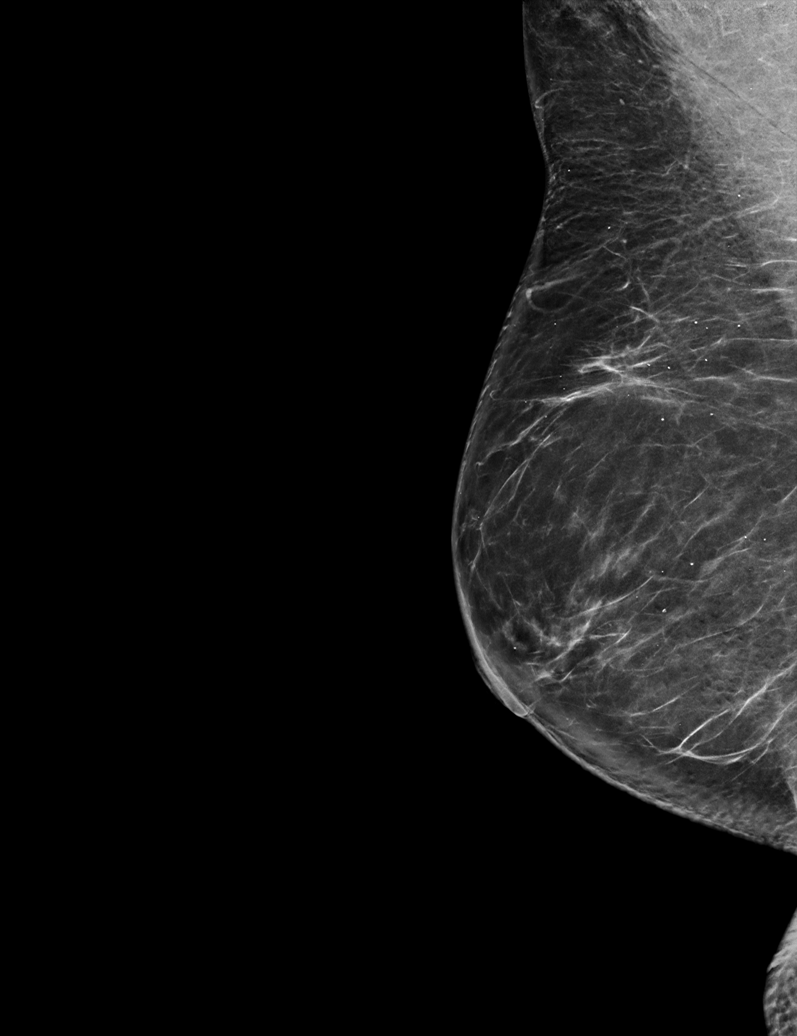

[L MLO synth-2D]
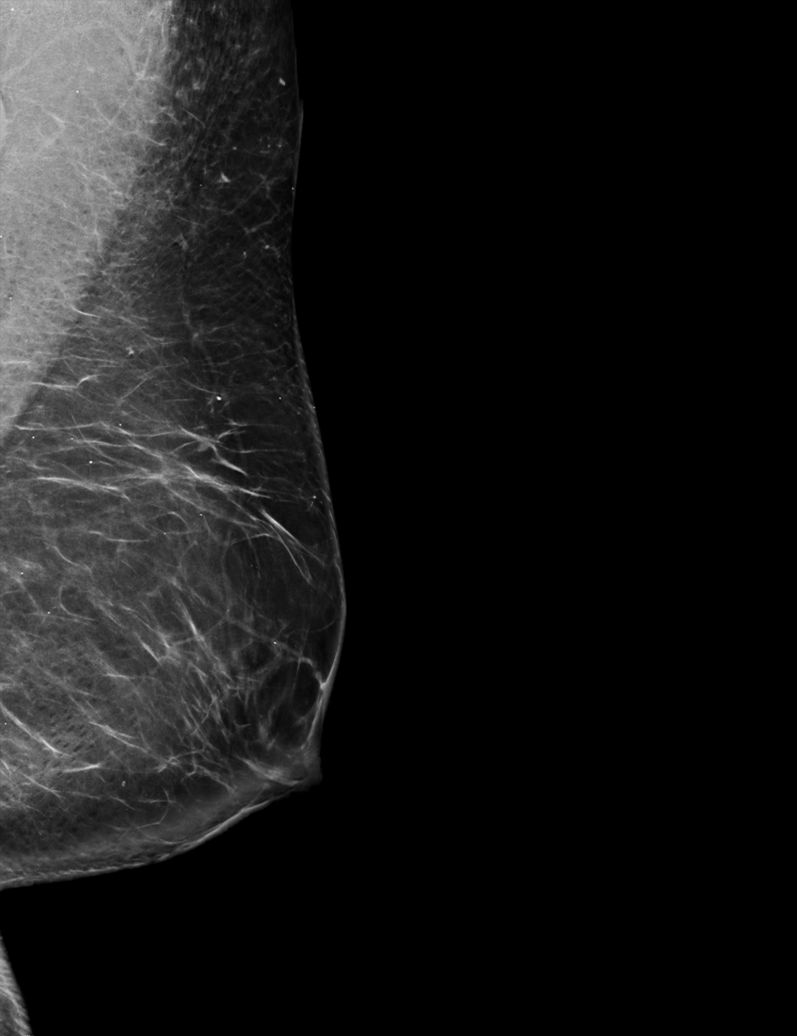

[R MLO synth-2D (2 of 2)]
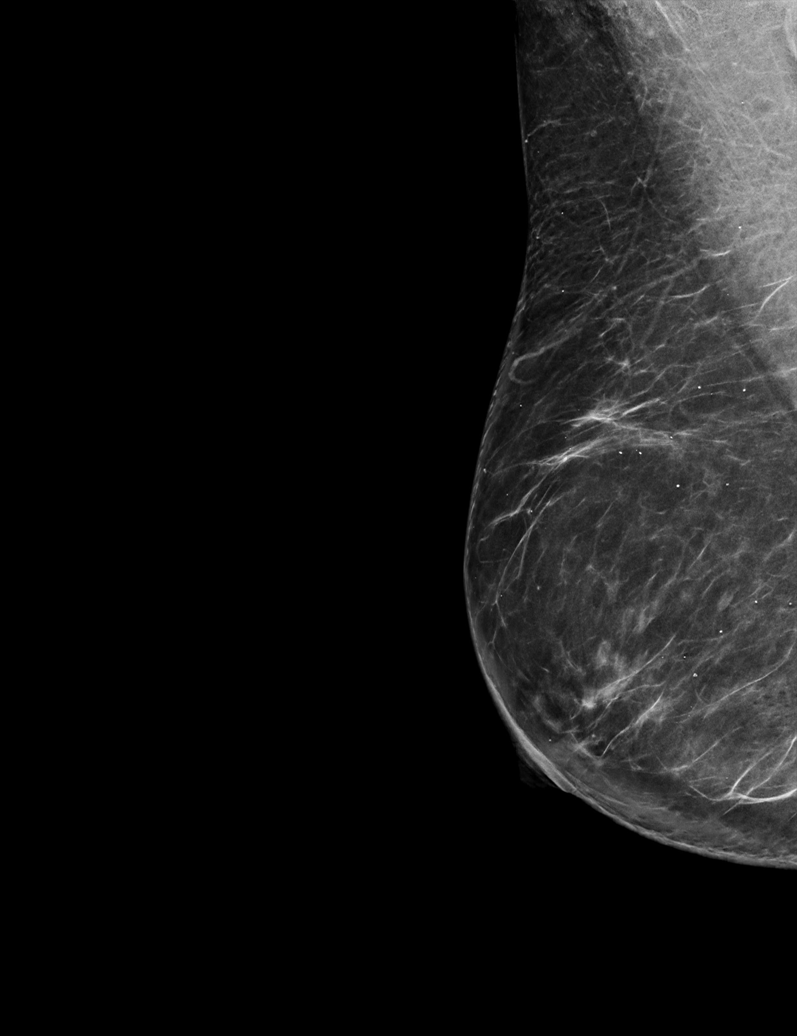

[R CC synth-2D]
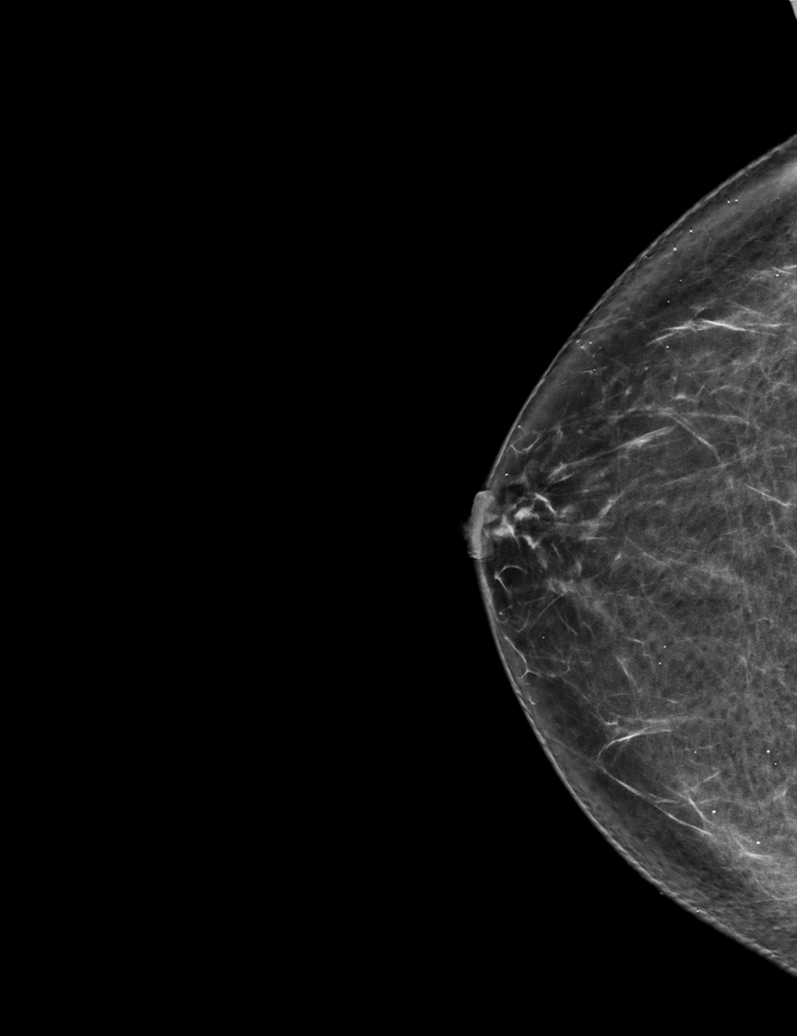

[L MLO tomo · tomo slice 39/78.0]
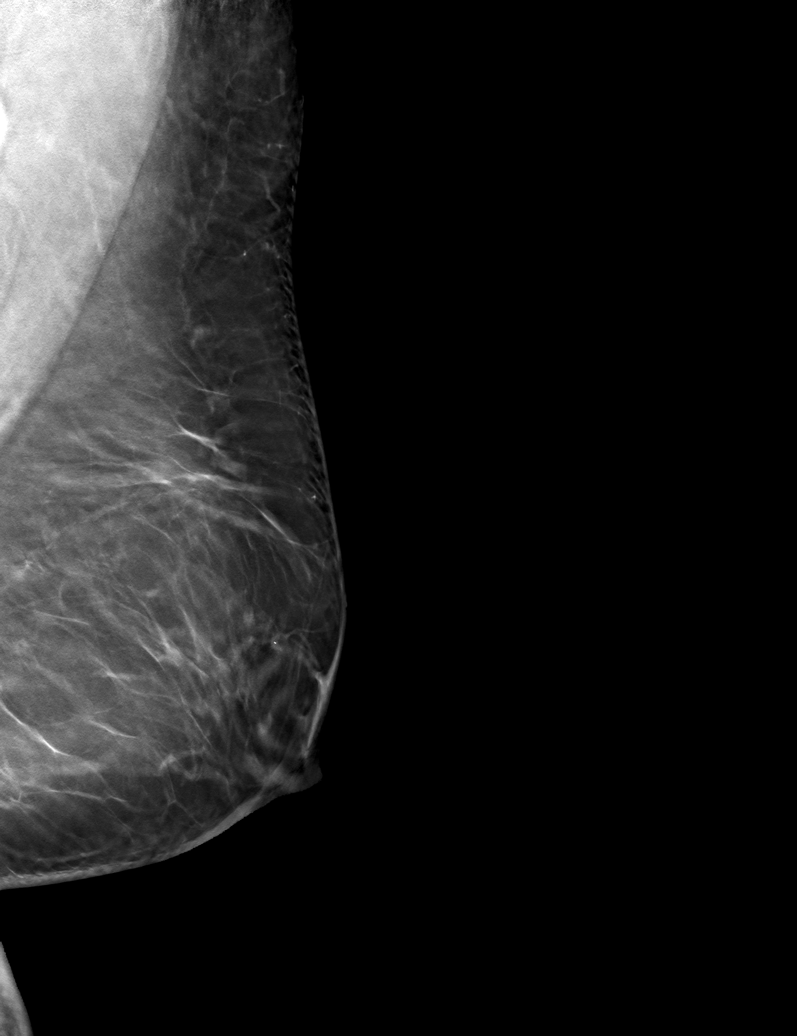

[6 of 30 positions shown; findings below may reference images not displayed]

ACR Breast Density Category b: There are scattered areas of
fibroglandular density.
FINDINGS: There are no findings suspicious for malignancy.
IMPRESSION: No mammographic evidence of malignancy. A result letter of this
screening mammogram will be mailed directly to the patient.

RECOMMENDATION:
Screening mammogram in one year. (Code:51-O-LD2)

BI-RADS CATEGORY  1: Negative.

## 2022-04-09 ENCOUNTER — Ambulatory Visit: Payer: Managed Care, Other (non HMO) | Admitting: Family Medicine

## 2022-04-09 ENCOUNTER — Encounter: Payer: Self-pay | Admitting: Family Medicine

## 2022-04-09 VITALS — BP 110/64 | HR 68 | Temp 96.3°F | Resp 16 | Ht 60.0 in | Wt 167.0 lb

## 2022-04-09 DIAGNOSIS — F330 Major depressive disorder, recurrent, mild: Secondary | ICD-10-CM

## 2022-04-09 DIAGNOSIS — I152 Hypertension secondary to endocrine disorders: Secondary | ICD-10-CM

## 2022-04-09 DIAGNOSIS — E782 Mixed hyperlipidemia: Secondary | ICD-10-CM

## 2022-04-09 DIAGNOSIS — E039 Hypothyroidism, unspecified: Secondary | ICD-10-CM | POA: Diagnosis not present

## 2022-04-09 DIAGNOSIS — E1169 Type 2 diabetes mellitus with other specified complication: Secondary | ICD-10-CM

## 2022-04-09 DIAGNOSIS — E1121 Type 2 diabetes mellitus with diabetic nephropathy: Secondary | ICD-10-CM

## 2022-04-09 DIAGNOSIS — I10 Essential (primary) hypertension: Secondary | ICD-10-CM | POA: Diagnosis not present

## 2022-04-09 DIAGNOSIS — E1159 Type 2 diabetes mellitus with other circulatory complications: Secondary | ICD-10-CM

## 2022-04-09 NOTE — Progress Notes (Signed)
Subjective:  Patient ID: Robin Bonilla, female    DOB: 12/26/1955  Age: 66 y.o. MRN: [id number]  Chief Complaint  Patient presents with   Abdominal Pain    HPI Diabetes: sugars around 200s. Checking in am fasting. ON rybelsus 7 mg once daily and xigduo xr.  Utd on eye exam. Checking feet daily. Patient's appetite has decreased.  She is still eating but not very much.  She states she is fearful of having the diarrhea return after eating.  Depression: on citalopram 20 mg once daily.  Doing what very well.  She has returned to work.  She does feel little weak but not secondary to depression.  Patient was diagnosed with diverticulitis but did not respond well to treatment approximately 2 months ago.  She received a second treatment.  She then was referred to Dr. Jennye Boroughs with gastroenterology.  She was found to have C. difficile and and was treated with vancomycin.  She has completed this.  She is doing much better.  Occasionally she will have some diarrhea.  She has been instructed to take Lomotil if this is occurring infrequently.  If she is having a lot of diarrhea then Dr. Jennye Boroughs requested she call back.  She can also use dicyclomine.  Hyperlipidemia: : Patient is currently on Vascepa 1 g 2 capsules twice daily and rosuvastatin 20 mg nightly.   Hypertension: On lisinopril 10 mg daily.  This is also for nephro protection.  Current Outpatient Medications on File Prior to Visit  Medication Sig Dispense Refill   ACCU-CHEK GUIDE test strip TEST STRIPS TWICE A DAY IN VITRO 30 DAYS 100 each 2   citalopram (CELEXA) 20 MG tablet TAKE 1 TABLET BY MOUTH EVERY DAY (Patient taking differently: Take 20 mg by mouth daily.) 90 tablet 1   lisinopril (ZESTRIL) 10 MG tablet Take 1 tablet (10 mg total) by mouth daily. 90 tablet 1   ondansetron (ZOFRAN) 4 MG tablet Take 1 tablet (4 mg total) by mouth every 6 (six) hours as needed for nausea. 20 tablet 0   rosuvastatin (CRESTOR) 20 MG tablet  TAKE 1 TABLET BY MOUTH EVERY DAY 90 tablet 1   Semaglutide ([name]) 7 MG TABS Take 7 mg by mouth daily. 30 tablet 3   triamcinolone cream (KENALOG) 0.5 % Apply 1 application. topically 2 (two) times daily.     [name] 01-999 MG TB24 TAKE 2 TABLETS BY MOUTH DAILY. STOP METFORMIN. 180 tablet 0   No current facility-administered medications on file prior to visit.   Past Medical History:  Diagnosis Date   Acute sinusitis    Depression    Diabetes mellitus 2005   Dysthymic disorder    Hirsutism    Hyperlipidemia    Rash, skin    Thyroid disease    Past Surgical History:  Procedure Laterality Date   BACK SURGERY     CHOLECYSTECTOMY  05/30/11   PARTIAL HYSTERECTOMY     TONSILLECTOMY     TUBAL LIGATION      Family History  Problem Relation Age of Onset   Breast cancer Mother 54   CAD Father    CAD Maternal Grandmother    Hypertension Maternal Grandmother    Diabetes Paternal Grandfather    Diabetes Paternal Aunt    Social History   Socioeconomic History   Marital status: Married    Spouse name: Not on file   Number of children: Not on file   Years of education: Not on file  Highest education level: Not on file  Occupational History   Not on file  Tobacco Use   Smoking status: Never   Smokeless tobacco: Never  Substance and Sexual Activity   Alcohol use: No    Alcohol/week: 0.0 standard drinks of alcohol   Drug use: No   Sexual activity: Not on file  Other Topics Concern   Not on file  Social History Narrative   Not on file   Social Determinants of Health   Financial Resource Strain: Not on file  Food Insecurity: Not on file  Transportation Needs: Not on file  Physical Activity: Not on file  Stress: Not on file  Social Connections: Not on file    Review of Systems  Constitutional:  Positive for fatigue. Negative for chills and fever.  HENT:  Negative for congestion, rhinorrhea and sore throat.   Respiratory:  Negative for cough and shortness of  breath.   Cardiovascular:  Negative for chest pain.  Gastrointestinal:  Positive for abdominal pain (much improved since last visit), diarrhea and nausea. Negative for constipation and vomiting.  Genitourinary:  Negative for dysuria and urgency.  Musculoskeletal:  Negative for back pain and myalgias.  Neurological:  Negative for dizziness, weakness, light-headedness and headaches.  Psychiatric/Behavioral:  Negative for dysphoric mood. The patient is not nervous/anxious.      Objective:  BP 110/64   Pulse 68   Temp (!) 96.3 F (35.7 C)   Resp 16   Ht 5' (1.524 m)   Wt 167 lb (75.8 kg)   BMI 32.61 kg/m      04/09/2022    7:46 AM 02/25/2022   10:57 AM 02/12/2022   11:15 AM  BP/Weight  Systolic BP 110 128 106  Diastolic BP 64 60 60  Wt. (Lbs) 167 167 168  BMI 32.61 kg/m2 32.61 kg/m2 32.81 kg/m2    Physical Exam Vitals reviewed.  Constitutional:      Appearance: Normal appearance. She is normal weight.  Neck:     Vascular: No carotid bruit.  Cardiovascular:     Rate and Rhythm: Normal rate and regular rhythm.     Heart sounds: Normal heart sounds.  Pulmonary:     Effort: Pulmonary effort is normal. No respiratory distress.     Breath sounds: Normal breath sounds.  Abdominal:     General: Abdomen is flat. Bowel sounds are normal.     Palpations: Abdomen is soft.     Tenderness: There is no abdominal tenderness.  Neurological:     Mental Status: She is alert and oriented to person, place, and time.  Psychiatric:        Mood and Affect: Mood normal.        Behavior: Behavior normal.     Diabetic Foot Exam - Simple   Simple Foot Form Diabetic Foot exam was performed with the following findings: Yes 04/09/2022  8:34 AM  Visual Inspection No deformities, no ulcerations, no other skin breakdown bilaterally: Yes Sensation Testing Intact to touch and monofilament testing bilaterally: Yes Pulse Check Posterior Tibialis and Dorsalis pulse intact bilaterally:  Yes Comments      Lab Results  Component Value Date   WBC 7.1 04/09/2022   HGB 13.9 04/09/2022   HCT 42.4 04/09/2022   PLT 216 04/09/2022   GLUCOSE 222 (H) 04/09/2022   CHOL 128 04/09/2022   TRIG 205 (H) 04/09/2022   HDL 34 (L) 04/09/2022   LDLCALC 60 04/09/2022   ALT 28 04/09/2022   AST 22 04/09/2022  NA 141 04/09/2022   K 4.7 04/09/2022   CL 101 04/09/2022   CREATININE 0.83 04/09/2022   BUN 14 04/09/2022   CO2 22 04/09/2022   TSH 0.098 (L) 04/09/2022   HGBA1C 10.9 (H) 04/09/2022   MICROALBUR 80 09/26/2021      Assessment & Plan:   Problem List Items Addressed This Visit       Cardiovascular and Mediastinum   Hypertension associated with diabetes (HCC)    Continue lisinopril 10 mg daily.       Relevant Medications   insulin degludec (TRESIBA FLEXTOUCH) 200 UNIT/ML FlexTouch Pen     Endocrine   Hypothyroidism (acquired) - Primary    Not at goal Increase Synthroid 125 mcg once daily in am.  Recheck TSH and adjust Synthroid in 6 weeks as indicated        Relevant Orders   TSH (Completed)   Mixed diabetic hyperlipidemia associated with type 2 diabetes mellitus (HCC)    Control: poor Recommend check sugars fasting daily. Recommend check feet daily. Recommend annual eye exams. Medicines: start on tresiba 10 U before bed. Continue rybelsus 7 mg daily and xigduo xr 01-999 mg 2 tablets daily Continue to work on eating a healthy diet and exercise.  Labs drawn today.         Relevant Medications   insulin degludec (TRESIBA FLEXTOUCH) 200 UNIT/ML FlexTouch Pen   Other Relevant Orders   Lipid panel (Completed)     Other   Mixed hyperlipidemia    Well controlled.  No changes to medicines. Continue vascepa 1 gm 2 capsules twice daily and crestor 20 mg before bed. Start on fenofibrate 160 mg daily.  Continue to work on eating a healthy diet and exercise.  Labs drawn today.        Mild recurrent major depression (HCC)    The current medical regimen is  effective;  continue present plan and medications. Continue celexa 20 mg daily.      Other Visit Diagnoses     Diabetic glomerulopathy (HCC)       Relevant Medications   insulin degludec (TRESIBA FLEXTOUCH) 200 UNIT/ML FlexTouch Pen   Other Relevant Orders   Hemoglobin A1c (Completed)   Microalbumin / creatinine urine ratio   Essential hypertension, benign       Relevant Orders   CBC with Differential/Platelet (Completed)   Comprehensive metabolic panel (Completed)     .  Meds ordered this encounter  Medications   insulin degludec (TRESIBA FLEXTOUCH) 200 UNIT/ML FlexTouch Pen    Sig: Inject 10 Units into the skin at bedtime.    Dispense:  6 mL    Refill:  0    Orders Placed This Encounter  Procedures   CBC with Differential/Platelet   Comprehensive metabolic panel   Hemoglobin A1c   Lipid panel   TSH   Microalbumin / creatinine urine ratio   Cardiovascular Risk Assessment     Follow-up: Return in about 3 months (around 07/10/2022) for chronic fasting.  An After Visit Summary was printed and given to the patient.  Blane Ohara, MD [facility] [phone]

## 2022-04-10 LAB — CBC WITH DIFFERENTIAL/PLATELET
Basophils Absolute: 0.1 10*3/uL (ref 0.0–0.2)
Basos: 1 %
EOS (ABSOLUTE): 0.3 10*3/uL (ref 0.0–0.4)
Eos: 4 %
Hematocrit: 42.4 % (ref 34.0–46.6)
Hemoglobin: 13.9 g/dL (ref 11.1–15.9)
Immature Grans (Abs): 0 10*3/uL (ref 0.0–0.1)
Immature Granulocytes: 0 %
Lymphocytes Absolute: 2.8 10*3/uL (ref 0.7–3.1)
Lymphs: 40 %
MCH: 26.7 pg (ref 26.6–33.0)
MCHC: 32.8 g/dL (ref 31.5–35.7)
MCV: 82 fL (ref 79–97)
Monocytes Absolute: 0.4 10*3/uL (ref 0.1–0.9)
Monocytes: 6 %
Neutrophils Absolute: 3.5 10*3/uL (ref 1.4–7.0)
Neutrophils: 49 %
Platelets: 216 10*3/uL (ref 150–450)
RBC: 5.2 x10E6/uL (ref 3.77–5.28)
RDW: 13.9 % (ref 11.7–15.4)
WBC: 7.1 10*3/uL (ref 3.4–10.8)

## 2022-04-10 LAB — LIPID PANEL
Chol/HDL Ratio: 3.8 ratio (ref 0.0–4.4)
Cholesterol, Total: 128 mg/dL (ref 100–199)
HDL: 34 mg/dL — ABNORMAL LOW (ref >39)
LDL Chol Calc (NIH): 60 mg/dL (ref 0–99)
Triglycerides: 205 mg/dL — ABNORMAL HIGH (ref 0–149)
VLDL Cholesterol Cal: 34 mg/dL (ref 5–40)

## 2022-04-10 LAB — COMPREHENSIVE METABOLIC PANEL
ALT: 28 IU/L (ref 0–32)
AST: 22 IU/L (ref 0–40)
Albumin/Globulin Ratio: 1.6 (ref 1.2–2.2)
Albumin: 4.5 g/dL (ref 3.9–4.9)
Alkaline Phosphatase: 83 IU/L (ref 44–121)
BUN/Creatinine Ratio: 17 (ref 12–28)
BUN: 14 mg/dL (ref 8–27)
Bilirubin Total: 1.3 mg/dL — ABNORMAL HIGH (ref 0.0–1.2)
CO2: 22 mmol/L (ref 20–29)
Calcium: 10.2 mg/dL (ref 8.7–10.3)
Chloride: 101 mmol/L (ref 96–106)
Creatinine, Ser: 0.83 mg/dL (ref 0.57–1.00)
Globulin, Total: 2.9 g/dL (ref 1.5–4.5)
Glucose: 222 mg/dL — ABNORMAL HIGH (ref 70–99)
Potassium: 4.7 mmol/L (ref 3.5–5.2)
Sodium: 141 mmol/L (ref 134–144)
Total Protein: 7.4 g/dL (ref 6.0–8.5)
eGFR: 79 mL/min/{1.73_m2} (ref >59)

## 2022-04-10 LAB — TSH: TSH: 0.098 u[IU]/mL — ABNORMAL LOW (ref 0.450–4.500)

## 2022-04-10 LAB — HEMOGLOBIN A1C
Est. average glucose Bld gHb Est-mCnc: 266 mg/dL
Hgb A1c MFr Bld: 10.9 % — ABNORMAL HIGH (ref 4.8–5.6)

## 2022-04-10 NOTE — Progress Notes (Signed)
Blood count normal.  Liver function normal.  Kidney function normal.  Thyroid function abnormal. Decrease levothyroxine 125 mcg once daily in am.  Cholesterol: triglycerides 205. Elevated. HDL low.  LDL good at 60. Confirm patient is taking vascepa 1 gm 2 capsules twice daily and rosuvastatin 20 mg daily. I would recommend add fenofibrate 160 mg daily.  Sugar 222. HBA1C: much worse. Up to 10.9. uncontrolled for the last year. On xigduo xr 01/999 mg 2 tablets in am, rybelsus 7 mg once in am. Patient needs to start insulin. Recommend toujeo or tresiba which ever is preferred on her formulary. Start on 10 U at night. Check sugars twice daily. Recommend increase insulin by 2 U every 3 days until sugars consistently less than 150.

## 2022-04-11 ENCOUNTER — Telehealth: Payer: Self-pay

## 2022-04-11 ENCOUNTER — Other Ambulatory Visit: Payer: Self-pay

## 2022-04-11 MED ORDER — ICOSAPENT ETHYL 1 G PO CAPS: 2.0000 g | ORAL_CAPSULE | Freq: Two times a day (BID) | ORAL | 0 refills | Status: DC

## 2022-04-11 MED ORDER — LEVOTHYROXINE SODIUM 125 MCG PO TABS: 125.0000 ug | ORAL_TABLET | Freq: Every day | ORAL | 2 refills | Status: DC

## 2022-04-11 NOTE — Telephone Encounter (Signed)
Husband called requesting Eber Jones. Informed him Alenna was sent to her phone earlier to leave VM. Eber Jones will listen and return call when she is able. Husband sounded upset Eber Jones was not calling back immediately. He was informed she is helping with pts. Call ended.   [name], CCMA 04/11/22 10:30 AM

## 2022-04-14 ENCOUNTER — Other Ambulatory Visit: Payer: Self-pay

## 2022-04-14 ENCOUNTER — Telehealth: Payer: Self-pay

## 2022-04-14 MED ORDER — TRESIBA FLEXTOUCH 200 UNIT/ML ~~LOC~~ SOPN: 10.0000 [IU] | PEN_INJECTOR | Freq: Every day | SUBCUTANEOUS | 0 refills | Status: DC

## 2022-04-14 MED ORDER — FENOFIBRATE 160 MG PO TABS: 160.0000 mg | ORAL_TABLET | Freq: Every day | ORAL | 2 refills | Status: DC

## 2022-04-14 NOTE — Assessment & Plan Note (Signed)
Not at goal Increase Synthroid 125 mcg once daily in am.  Recheck TSH and adjust Synthroid in 6 weeks as indicated

## 2022-04-14 NOTE — Assessment & Plan Note (Signed)
Control: poor Recommend check sugars fasting daily. Recommend check feet daily. Recommend annual eye exams. Medicines: start on tresiba 10 U before bed. Continue rybelsus 7 mg daily and xigduo xr 01-999 mg 2 tablets daily Continue to work on eating a healthy diet and exercise.  Labs drawn today.

## 2022-04-14 NOTE — Assessment & Plan Note (Signed)
The current medical regimen is effective;  continue present plan and medications. Continue celexa 20 mg daily  ? ?

## 2022-04-14 NOTE — Telephone Encounter (Signed)
Patient notified of lab results.  She has been taking her vascepa and the crestor.  She is willing to try fenofibrate.  She is hesitant to start insulin but she is going to pick up a  sample and give it a try.  She will pick-up on Wednesday.

## 2022-04-14 NOTE — Assessment & Plan Note (Addendum)
Well controlled.  No changes to medicines. Continue vascepa 1 gm 2 capsules twice daily and crestor 20 mg before bed. Start on fenofibrate 160 mg daily.  Continue to work on eating a healthy diet and exercise.  Labs drawn today.

## 2022-04-14 NOTE — Assessment & Plan Note (Signed)
Continue lisinopril 10 mg daily. 

## 2022-04-15 ENCOUNTER — Other Ambulatory Visit: Payer: Self-pay

## 2022-04-15 DIAGNOSIS — E1121 Type 2 diabetes mellitus with diabetic nephropathy: Secondary | ICD-10-CM

## 2022-04-15 DIAGNOSIS — I10 Essential (primary) hypertension: Secondary | ICD-10-CM

## 2022-04-15 MED ORDER — LISINOPRIL 10 MG PO TABS: 10.0000 mg | ORAL_TABLET | Freq: Every day | ORAL | 0 refills | Status: DC

## 2022-04-15 MED ORDER — XIGDUO XR 5-1000 MG PO TB24: 2.0000 | ORAL_TABLET | Freq: Every day | ORAL | 0 refills | Status: DC

## 2022-04-16 ENCOUNTER — Other Ambulatory Visit: Payer: Managed Care, Other (non HMO)

## 2022-04-21 ENCOUNTER — Telehealth: Payer: Self-pay

## 2022-04-21 DIAGNOSIS — I10 Essential (primary) hypertension: Secondary | ICD-10-CM

## 2022-04-21 DIAGNOSIS — E1121 Type 2 diabetes mellitus with diabetic nephropathy: Secondary | ICD-10-CM

## 2022-04-21 MED ORDER — XIGDUO XR 5-1000 MG PO TB24: 2.0000 | ORAL_TABLET | Freq: Every day | ORAL | 0 refills | Status: DC

## 2022-04-21 MED ORDER — LISINOPRIL 10 MG PO TABS: 10.0000 mg | ORAL_TABLET | Freq: Every day | ORAL | 0 refills | Status: DC

## 2022-04-25 ENCOUNTER — Other Ambulatory Visit: Payer: Self-pay | Admitting: Family Medicine

## 2022-05-02 ENCOUNTER — Telehealth: Payer: Self-pay

## 2022-05-02 NOTE — Telephone Encounter (Signed)
Ok

## 2022-05-02 NOTE — Telephone Encounter (Signed)
Spoke w/ Robin Bonilla, patient's tresiba was $104. He is unsure of what they/we can do about this price. Patient does not need refill currently but they will let us know.   [name], CCMA 05/02/22 9:03 AM

## 2022-05-02 NOTE — Telephone Encounter (Signed)
Spoke with [name], they do not have a computer to print this. Made them aware I could print this and they pick up or can come to office to fill out. Did ask them to bring annual income total. [name] states they will find something else or pay out of pocket. They do not want to do patient assistance.   [name], [place] 05/02/22 10:37 AM

## 2022-05-05 ENCOUNTER — Other Ambulatory Visit: Payer: Self-pay

## 2022-05-27 NOTE — Assessment & Plan Note (Signed)
Previously uncontrolled Continue Synthroid at current dose  Recheck TSH and adjust Synthroid as indicated   

## 2022-05-27 NOTE — Progress Notes (Signed)
Subjective:  Patient ID: Robin Bonilla, female    DOB: 02-26-1956  Age: 66 y.o. MRN: [id number]  Chief Complaint  Patient presents with   Hypothyroidism    HPI Hypothyroidism: Taking Levothyroxine 125 mcg daily before breakfast.  Diabetes: Patient was seen on 04/09/2022 and Dr Sedalia Muta made some changes on his medication. Patient on xigduo xr 01/999 mg 2 tablets in am, rybelsus 7 mg once in am. She started insulin. A1C:  Up to 10.9. uncontrolled for the last year.Recommend increase Tresiba 10 U insulin by 2 U every 3 days until sugars consistently less than 150. Check sugars twice daily.  Patient is up to 16 U daily. Sugars 144-180.    Current Outpatient Medications on File Prior to Visit  Medication Sig Dispense Refill   insulin degludec (TRESIBA FLEXTOUCH) 200 UNIT/ML FlexTouch Pen Inject 10 Units into the skin at bedtime. (Patient taking differently: Inject 16 Units into the skin at bedtime.) 6 mL 0   ACCU-CHEK GUIDE test strip TEST STRIPS TWICE A DAY IN VITRO 30 DAYS 100 each 2   Dapagliflozin-metFORMIN HCl ER ([name]) 01-999 MG TB24 Take 2 tablets by mouth daily. STOP METFORMIN. 180 tablet 0   fenofibrate 160 MG tablet Take 1 tablet (160 mg total) by mouth daily. 30 tablet 2   lisinopril (ZESTRIL) 10 MG tablet Take 1 tablet (10 mg total) by mouth daily. 90 tablet 0   ondansetron (ZOFRAN) 4 MG tablet Take 1 tablet (4 mg total) by mouth every 6 (six) hours as needed for nausea. 20 tablet 0   rosuvastatin (CRESTOR) 20 MG tablet TAKE 1 TABLET BY MOUTH EVERY DAY 90 tablet 1   Semaglutide ([name]) 7 MG TABS Take 7 mg by mouth daily. 30 tablet 3   triamcinolone cream (KENALOG) 0.5 % Apply 1 application. topically 2 (two) times daily.     No current facility-administered medications on file prior to visit.   Past Medical History:  Diagnosis Date   Acute sinusitis    Depression    Diabetes mellitus 2005   Dysthymic disorder    Hirsutism    Hyperlipidemia    Rash, skin     Thyroid disease    Past Surgical History:  Procedure Laterality Date   BACK SURGERY     CHOLECYSTECTOMY  05/30/11   PARTIAL HYSTERECTOMY     TONSILLECTOMY     TUBAL LIGATION      Family History  Problem Relation Age of Onset   Breast cancer Mother 45   CAD Father    CAD Maternal Grandmother    Hypertension Maternal Grandmother    Diabetes Paternal Grandfather    Diabetes Paternal Aunt    Social History   Socioeconomic History   Marital status: Married    Spouse name: Not on file   Number of children: Not on file   Years of education: Not on file   Highest education level: Not on file  Occupational History   Not on file  Tobacco Use   Smoking status: Never   Smokeless tobacco: Never  Substance and Sexual Activity   Alcohol use: No    Alcohol/week: 0.0 standard drinks of alcohol   Drug use: No   Sexual activity: Not on file  Other Topics Concern   Not on file  Social History Narrative   Not on file   Social Determinants of Health   Financial Resource Strain: Not on file  Food Insecurity: Not on file  Transportation Needs: Not on file  Physical Activity: Not on file  Stress: Not on file  Social Connections: Not on file    Review of Systems  Constitutional:  Negative for chills, fatigue and fever.  HENT:  Negative for congestion, rhinorrhea and sore throat.   Respiratory:  Negative for cough and shortness of breath.   Cardiovascular:  Negative for chest pain.  Gastrointestinal:  Positive for abdominal pain and diarrhea (improving.). Negative for constipation, nausea and vomiting.  Endocrine: Positive for polyuria. Negative for polydipsia and polyphagia.  Genitourinary:  Negative for dysuria and urgency.  Musculoskeletal:  Negative for back pain and myalgias.  Neurological:  Positive for light-headedness. Negative for dizziness, weakness and headaches.  Psychiatric/Behavioral:  Negative for dysphoric mood. The patient is not nervous/anxious.      Objective:   BP (!) 104/58   Pulse 76   Temp (!) 97 F (36.1 C)   Resp 16   Ht 5' (1.524 m)   Wt 165 lb (74.8 kg)   BMI 32.22 kg/m      05/28/2022    8:28 AM 05/28/2022    7:38 AM 04/09/2022    7:46 AM  BP/Weight  Systolic BP 104 92 110  Diastolic BP 58 48 64  Wt. (Lbs)  165 167  BMI  32.22 kg/m2 32.61 kg/m2    Physical Exam Vitals reviewed.  Constitutional:      Appearance: Normal appearance.  Neck:     Vascular: No carotid bruit.  Cardiovascular:     Rate and Rhythm: Normal rate and regular rhythm.     Heart sounds: Normal heart sounds.  Pulmonary:     Effort: Pulmonary effort is normal.     Breath sounds: Normal breath sounds.  Abdominal:     General: Bowel sounds are normal.     Palpations: Abdomen is soft.     Tenderness: There is no abdominal tenderness.  Neurological:     Mental Status: She is alert and oriented to person, place, and time.  Psychiatric:        Mood and Affect: Mood normal.        Behavior: Behavior normal.    Diabetic Foot Exam - Simple   No data filed      Lab Results  Component Value Date   WBC 7.1 04/09/2022   HGB 13.9 04/09/2022   HCT 42.4 04/09/2022   PLT 216 04/09/2022   GLUCOSE 222 (H) 04/09/2022   CHOL 128 04/09/2022   TRIG 205 (H) 04/09/2022   HDL 34 (L) 04/09/2022   LDLCALC 60 04/09/2022   ALT 28 04/09/2022   AST 22 04/09/2022   NA 141 04/09/2022   K 4.7 04/09/2022   CL 101 04/09/2022   CREATININE 0.83 04/09/2022   BUN 14 04/09/2022   CO2 22 04/09/2022   TSH 0.080 (L) 05/28/2022   HGBA1C 10.9 (H) 04/09/2022   MICROALBUR 80 09/26/2021      Assessment & Plan:   Problem List Items Addressed This Visit       Endocrine   Hypothyroidism (acquired) - Primary    Previously uncontrolled Continue Synthroid at current dose  Recheck TSH and adjust Synthroid as indicated       Relevant Orders   TSH (Completed)   Mixed diabetic hyperlipidemia associated with type 2 diabetes mellitus (HCC)    Control:  Recommend check  sugars fasting daily. Recommend check feet daily. Recommend annual eye exams. Medicines: xigduo xr 01/999 mg 2 tablets in am, rybelsus 7 mg once in am. [name] 10 units  at bedtime. Continue to work on eating a healthy diet and exercise.        Relevant Orders   Microalbumin/Creatinine Ratio, Urine (Completed)  .  Meds ordered this encounter  Medications   DISCONTD: cefdinir (OMNICEF) 300 MG capsule    Sig: Take 1 capsule (300 mg total) by mouth 2 (two) times daily.    Dispense:  20 capsule    Refill:  0    Orders Placed This Encounter  Procedures   TSH   Microalbumin/Creatinine Ratio, Urine   Follow-up: Return in about 7 weeks (around 07/14/2022) for chronic fasting.  An After Visit Summary was printed and given to the patient.  Blane Ohara, MD [facility] [phone]

## 2022-05-27 NOTE — Assessment & Plan Note (Signed)
Control:  Recommend check sugars fasting daily. Recommend check feet daily. Recommend annual eye exams. Medicines: xigduo xr 01/999 mg 2 tablets in am, rybelsus 7 mg once in am. [name] 10 units at bedtime. Continue to work on eating a healthy diet and exercise.

## 2022-05-28 ENCOUNTER — Encounter: Payer: Self-pay | Admitting: Family Medicine

## 2022-05-28 ENCOUNTER — Ambulatory Visit: Payer: Managed Care, Other (non HMO) | Admitting: Family Medicine

## 2022-05-28 ENCOUNTER — Other Ambulatory Visit: Payer: Self-pay | Admitting: Family Medicine

## 2022-05-28 VITALS — BP 104/58 | HR 76 | Temp 97.0°F | Resp 16 | Ht 60.0 in | Wt 165.0 lb

## 2022-05-28 DIAGNOSIS — E782 Mixed hyperlipidemia: Secondary | ICD-10-CM | POA: Diagnosis not present

## 2022-05-28 DIAGNOSIS — E1169 Type 2 diabetes mellitus with other specified complication: Secondary | ICD-10-CM | POA: Diagnosis not present

## 2022-05-28 DIAGNOSIS — E039 Hypothyroidism, unspecified: Secondary | ICD-10-CM | POA: Diagnosis not present

## 2022-05-28 LAB — HM DIABETES EYE EXAM

## 2022-05-28 MED ORDER — CEFDINIR 300 MG PO CAPS: 300.0000 mg | ORAL_CAPSULE | Freq: Two times a day (BID) | ORAL | 0 refills | Status: DC

## 2022-05-28 NOTE — Patient Instructions (Addendum)
Check to see if taking Fenofibrate 160 mg daily.   Check to see if taking vascepa 1 gm 2 capsules twice daily.   Decrease lisinopril to 10 mg 1/2 pill daily.   Increase tresiba to 20 U.  Increase by 2 U every 3 days until sugars less than 150.

## 2022-05-29 ENCOUNTER — Telehealth: Payer: Self-pay

## 2022-05-29 ENCOUNTER — Other Ambulatory Visit: Payer: Self-pay

## 2022-05-29 LAB — TSH: TSH: 0.08 u[IU]/mL — ABNORMAL LOW (ref 0.450–4.500)

## 2022-05-29 MED ORDER — LEVOTHYROXINE SODIUM 112 MCG PO TABS: 112.0000 ug | ORAL_TABLET | Freq: Every day | ORAL | 1 refills | Status: DC

## 2022-05-29 MED ORDER — ICOSAPENT ETHYL 1 G PO CAPS: ORAL_CAPSULE | ORAL | 2 refills | Status: DC

## 2022-05-29 NOTE — Telephone Encounter (Signed)
[name] called to inform us that Robin Bonilla has not been on Vascepa but she is willing to take the medication if insurance will cover.

## 2022-05-30 ENCOUNTER — Encounter: Payer: Self-pay | Admitting: Family Medicine

## 2022-05-30 LAB — MICROALBUMIN / CREATININE URINE RATIO
Creatinine, Urine: 54.7 mg/dL
Microalb/Creat Ratio: 34 mg/g{creat} — ABNORMAL HIGH (ref 0–29)
Microalbumin, Urine: 18.5 ug/mL

## 2022-06-01 ENCOUNTER — Other Ambulatory Visit: Payer: Self-pay | Admitting: Family Medicine

## 2022-06-03 ENCOUNTER — Other Ambulatory Visit: Payer: Self-pay

## 2022-06-03 ENCOUNTER — Other Ambulatory Visit: Payer: Self-pay | Admitting: Family Medicine

## 2022-06-03 ENCOUNTER — Other Ambulatory Visit: Payer: Self-pay | Admitting: Physician Assistant

## 2022-06-03 DIAGNOSIS — R809 Proteinuria, unspecified: Secondary | ICD-10-CM

## 2022-06-03 MED ORDER — LEVOTHYROXINE SODIUM 112 MCG PO TABS
112.0000 ug | ORAL_TABLET | Freq: Every day | ORAL | 1 refills | Status: DC
Start: 2022-06-03 — End: 2022-06-27

## 2022-06-03 MED ORDER — KERENDIA 10 MG PO TABS: 1.0000 | ORAL_TABLET | Freq: Every day | ORAL | 2 refills | Status: DC

## 2022-06-04 ENCOUNTER — Other Ambulatory Visit: Payer: Self-pay

## 2022-06-13 ENCOUNTER — Other Ambulatory Visit: Payer: Self-pay

## 2022-06-13 MED ORDER — ACCU-CHEK GUIDE VI STRP: ORAL_STRIP | 3 refills | Status: DC

## 2022-06-26 ENCOUNTER — Other Ambulatory Visit: Payer: Self-pay | Admitting: Physician Assistant

## 2022-07-03 ENCOUNTER — Other Ambulatory Visit: Payer: Self-pay | Admitting: Family Medicine

## 2022-07-12 ENCOUNTER — Other Ambulatory Visit: Payer: Self-pay | Admitting: Family Medicine

## 2022-07-15 ENCOUNTER — Encounter: Payer: Self-pay | Admitting: Orthopaedic Surgery

## 2022-07-15 ENCOUNTER — Ambulatory Visit (INDEPENDENT_AMBULATORY_CARE_PROVIDER_SITE_OTHER): Payer: Managed Care, Other (non HMO) | Admitting: Orthopaedic Surgery

## 2022-07-15 ENCOUNTER — Ambulatory Visit (INDEPENDENT_AMBULATORY_CARE_PROVIDER_SITE_OTHER): Payer: Managed Care, Other (non HMO)

## 2022-07-15 DIAGNOSIS — G8929 Other chronic pain: Secondary | ICD-10-CM

## 2022-07-15 DIAGNOSIS — M25562 Pain in left knee: Secondary | ICD-10-CM

## 2022-07-15 MED ORDER — DICLOFENAC SODIUM 75 MG PO TBEC: 75.0000 mg | DELAYED_RELEASE_TABLET | Freq: Two times a day (BID) | ORAL | 2 refills | Status: DC

## 2022-07-15 NOTE — Progress Notes (Signed)
Office Visit Note   Patient: Robin Bonilla           Date of Birth: [birth year]           MRN: [id number] Visit Date: 07/15/2022              Requested by: [name], MD [address] [place],  [place] [address] PCP: [name], MD   Assessment & Plan: Visit Diagnoses:  1. Chronic pain of left knee     Plan: Impression is left knee pain due to arthritis flare versus medial meniscal tear.  Disease processes and treatment options were explained she would like to try diclofenac for 2 weeks.  She would like to try to hold off on the injection for now.  She will follow-up with [place] if symptoms persist.  Follow-Up Instructions: No follow-ups on file.   Orders:  Orders Placed This Encounter  Procedures   XR KNEE 3 VIEW LEFT   Meds ordered this encounter  Medications   diclofenac (VOLTAREN) 75 MG EC tablet    Sig: Take 1 tablet (75 mg total) by mouth 2 (two) times daily.    Dispense:  30 tablet    Refill:  2      Procedures: No procedures performed   Clinical Data: No additional findings.   Subjective: Chief Complaint  Patient presents with   Left Knee - Pain    HPI Ms. Mungin is a very pleasant 66 year old female here for evaluation of left knee pain for 2 weeks that is getting worse.  Experiencing giving way and swelling.  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  Takes Tylenol for the pain.  Denies any mechanical symptoms. Review of Systems  Constitutional: Negative.   HENT: Negative.    Eyes: Negative.   Respiratory: Negative.    Cardiovascular: Negative.   Endocrine: Negative.   Musculoskeletal: Negative.   Neurological: Negative.   Hematological: Negative.   Psychiatric/Behavioral: Negative.    All other systems reviewed and are negative.    Objective: Vital Signs: There were no vitals taken for this visit.  Physical Exam Vitals and nursing note reviewed.  Constitutional:      Appearance: She is well-developed.   HENT:     Head: Atraumatic.     Nose: Nose normal.  Eyes:     Extraocular Movements: Extraocular movements intact.  Cardiovascular:     Pulses: Normal pulses.  Pulmonary:     Effort: Pulmonary effort is normal.  Abdominal:     Palpations: Abdomen is soft.  Musculoskeletal:     Cervical back: Neck supple.  Skin:    General: Skin is warm.     Capillary Refill: Capillary refill takes less than 2 seconds.  Neurological:     Mental Status: She is alert. Mental status is at baseline.  Psychiatric:        Behavior: Behavior normal.        Thought Content: Thought content normal.        Judgment: Judgment normal.     Ortho Exam Examination of the left knee shows trace effusion.  Slight medial joint line tenderness.  Negative McMurray.  Well-preserved range of motion.  There is no patellofemoral crepitus.  Collaterals and cruciates are stable. Specialty Comments:  No specialty comments available.  Imaging: No results found.   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   Class  1 obesity due to excess calories with serious comorbidity and body mass index (BMI) of 33.0 to 33.9 in adult 12/20/2021   Hypertension associated with diabetes ([place]) 09/14/2021   Thrombocytopenia ([place]) 09/14/2021   Psychophysiological insomnia 09/14/2021   Mixed diabetic hyperlipidemia associated with type 2 diabetes mellitus ([place]) 09/07/2021   Hypothyroidism (acquired) 02/05/2021   Mixed hyperlipidemia 02/05/2021   Close exposure to COVID-19 virus 05/21/2020   Vertigo 11/25/2019   Mixed conductive and sensorineural hearing loss of both ears 11/25/2019   ILD (interstitial lung disease) ([place]) 12/03/2015   Nausea 06/09/2011   Dermatitis, contact 06/09/2011   Pancreas cyst 05/08/2011   Past Medical History:  Diagnosis Date   Acute sinusitis    Depression    Diabetes mellitus 2005   Dysthymic disorder    Hirsutism     Hyperlipidemia    Rash, skin    Thyroid disease     Family History  Problem Relation Age of Onset   Breast cancer Mother 12   CAD Father    CAD Maternal Grandmother    Hypertension Maternal Grandmother    Diabetes Paternal Grandfather    Diabetes Paternal Aunt     Past Surgical History:  Procedure Laterality Date   BACK SURGERY     CHOLECYSTECTOMY  05/30/11   PARTIAL HYSTERECTOMY     TONSILLECTOMY     TUBAL LIGATION     Social History   Occupational History   Not on file  Tobacco Use   Smoking status: Never   Smokeless tobacco: Never  Substance and Sexual Activity   Alcohol use: No    Alcohol/week: 0.0 standard drinks of alcohol   Drug use: No   Sexual activity: Not on file

## 2022-07-16 ENCOUNTER — Ambulatory Visit: Payer: Managed Care, Other (non HMO) | Admitting: Family Medicine

## 2022-07-17 ENCOUNTER — Ambulatory Visit: Payer: Managed Care, Other (non HMO) | Admitting: Family Medicine

## 2022-07-17 VITALS — BP 110/70 | HR 84 | Temp 96.5°F | Resp 16 | Ht 60.0 in | Wt 171.0 lb

## 2022-07-17 DIAGNOSIS — F330 Major depressive disorder, recurrent, mild: Secondary | ICD-10-CM

## 2022-07-17 DIAGNOSIS — E1121 Type 2 diabetes mellitus with diabetic nephropathy: Secondary | ICD-10-CM

## 2022-07-17 DIAGNOSIS — E039 Hypothyroidism, unspecified: Secondary | ICD-10-CM | POA: Diagnosis not present

## 2022-07-17 DIAGNOSIS — E1169 Type 2 diabetes mellitus with other specified complication: Secondary | ICD-10-CM | POA: Diagnosis not present

## 2022-07-17 DIAGNOSIS — E6609 Other obesity due to excess calories: Secondary | ICD-10-CM

## 2022-07-17 DIAGNOSIS — Z6833 Body mass index (BMI) 33.0-33.9, adult: Secondary | ICD-10-CM

## 2022-07-17 DIAGNOSIS — I152 Hypertension secondary to endocrine disorders: Secondary | ICD-10-CM

## 2022-07-17 DIAGNOSIS — E782 Mixed hyperlipidemia: Secondary | ICD-10-CM

## 2022-07-17 DIAGNOSIS — E1159 Type 2 diabetes mellitus with other circulatory complications: Secondary | ICD-10-CM

## 2022-07-17 MED ORDER — TRESIBA FLEXTOUCH 200 UNIT/ML ~~LOC~~ SOPN: 20.0000 [IU] | PEN_INJECTOR | Freq: Every day | SUBCUTANEOUS | 0 refills | Status: DC

## 2022-07-17 MED ORDER — INSULIN PEN NEEDLE 32G X 4 MM MISC: 1.0000 | Freq: Every day | 3 refills | Status: DC

## 2022-07-17 NOTE — Progress Notes (Signed)
Subjective:  Patient ID: Robin Bonilla, female    DOB: 1955/12/23  Age: 66 y.o. MRN: [id number]  Chief Complaint  Patient presents with   Hypothyroidism   Diabetes    HPI  Diabetes: sugars 150-160s. Checking twice daily. ON rybelsus 7 mg once daily and xigduo xr 01/999 mg 2 daily. .  Utd on eye exam. Checking feet daily.  Healthy Diet. No exercising.   Depression: on citalopram 20 mg once daily.  Doing what very well.  Retiring.   Hyperlipidemia: : Patient is currently on Vascepa 1 g 2 capsules twice daily, fenofibrate 160 mg once daily and rosuvastatin 20 mg nightly.   Hypertension: On lisinopril 10 mg 1/2 daily.  This is also for nephro protection.    Current Outpatient Medications on File Prior to Visit  Medication Sig Dispense Refill   ACCU-CHEK GUIDE test strip TEST STRIPS TWICE A DAY 100 each 3   citalopram (CELEXA) 20 MG tablet TAKE 1 TABLET BY MOUTH EVERY DAY 90 tablet 1   Dapagliflozin-metFORMIN HCl ER ([name]) 01-999 MG TB24 Take 2 tablets by mouth daily. STOP METFORMIN. 180 tablet 0   diclofenac (VOLTAREN) 75 MG EC tablet Take 1 tablet (75 mg total) by mouth 2 (two) times daily. 30 tablet 2   fenofibrate 160 MG tablet TAKE 1 TABLET BY MOUTH EVERY DAY 90 tablet 0   icosapent Ethyl (VASCEPA) 1 g capsule TAKE 2 CAPSULES BY MOUTH 2 TIMES DAILY. 360 capsule 2   lisinopril (ZESTRIL) 10 MG tablet Take 1 tablet (10 mg total) by mouth daily. (Patient taking differently: Take 5 mg by mouth daily.) 90 tablet 0   ondansetron (ZOFRAN) 4 MG tablet Take 1 tablet (4 mg total) by mouth every 6 (six) hours as needed for nausea. 20 tablet 0   rosuvastatin (CRESTOR) 20 MG tablet TAKE 1 TABLET BY MOUTH EVERY DAY 90 tablet 1   triamcinolone cream (KENALOG) 0.5 % Apply 1 application. topically 2 (two) times daily.     No current facility-administered medications on file prior to visit.   Past Medical History:  Diagnosis Date   Acute sinusitis    Depression    Diabetes  mellitus 2005   Dysthymic disorder    Hirsutism    Hyperlipidemia    Rash, skin    Thyroid disease    Past Surgical History:  Procedure Laterality Date   BACK SURGERY     CHOLECYSTECTOMY  05/30/11   PARTIAL HYSTERECTOMY     TONSILLECTOMY     TUBAL LIGATION      Family History  Problem Relation Age of Onset   Breast cancer Mother 47   CAD Father    CAD Maternal Grandmother    Hypertension Maternal Grandmother    Diabetes Paternal Grandfather    Diabetes Paternal Aunt    Social History   Socioeconomic History   Marital status: Married    Spouse name: Not on file   Number of children: Not on file   Years of education: Not on file   Highest education level: Not on file  Occupational History   Not on file  Tobacco Use   Smoking status: Never   Smokeless tobacco: Never  Substance and Sexual Activity   Alcohol use: No    Alcohol/week: 0.0 standard drinks of alcohol   Drug use: No   Sexual activity: Not on file  Other Topics Concern   Not on file  Social History Narrative   Not on file   Social  Determinants of Health   Financial Resource Strain: Not on file  Food Insecurity: Not on file  Transportation Needs: Not on file  Physical Activity: Not on file  Stress: Not on file  Social Connections: Not on file    Review of Systems  Constitutional:  Positive for fatigue. Negative for chills and fever.  HENT:  Positive for congestion. Negative for rhinorrhea and sore throat.   Respiratory:  Negative for cough and shortness of breath.   Cardiovascular:  Negative for chest pain.  Gastrointestinal:  Negative for abdominal pain, constipation, diarrhea, nausea and vomiting.  Genitourinary:  Negative for dysuria and urgency.  Musculoskeletal:  Negative for back pain and myalgias.  Neurological:  Negative for dizziness, weakness, light-headedness and headaches.  Psychiatric/Behavioral:  Negative for dysphoric mood. The patient is not nervous/anxious.      Objective:   BP 110/70   Pulse 84   Temp (!) 96.5 F (35.8 C)   Resp 16   Ht 5' (1.524 m)   Wt 171 lb (77.6 kg)   BMI 33.40 kg/m      07/17/2022    1:45 PM 05/28/2022    8:28 AM 05/28/2022    7:38 AM  BP/Weight  Systolic BP 110 104 92  Diastolic BP 70 58 48  Wt. (Lbs) 171  165  BMI 33.4 kg/m2  32.22 kg/m2    Physical Exam  Diabetic Foot Exam - Simple   No data filed      Lab Results  Component Value Date   WBC 9.0 07/17/2022   HGB 12.9 07/17/2022   HCT 40.4 07/17/2022   PLT 265 07/17/2022   GLUCOSE 100 (H) 07/17/2022   CHOL 109 07/17/2022   TRIG 102 07/17/2022   HDL 40 07/17/2022   LDLCALC 50 07/17/2022   ALT 24 07/17/2022   AST 35 07/17/2022   NA 139 07/17/2022   K 4.4 07/17/2022   CL 101 07/17/2022   CREATININE 0.98 07/17/2022   BUN 19 07/17/2022   CO2 24 07/17/2022   TSH 0.297 (L) 07/17/2022   HGBA1C 9.0 (H) 07/17/2022   MICROALBUR 80 09/26/2021      Assessment & Plan:   Problem List Items Addressed This Visit       Cardiovascular and Mediastinum   Hypertension associated with diabetes (HCC)    Well controlled.  No changes to medicines lisinopril 10 mg 1/2 daily Continue to work on eating a healthy diet and exercise.  Labs drawn today.       Relevant Medications   insulin degludec (TRESIBA FLEXTOUCH) 200 UNIT/ML FlexTouch Pen   Other Relevant Orders   CBC with Differential/Platelet (Completed)   Comprehensive metabolic panel (Completed)     Endocrine   Hypothyroidism (acquired)    Previously well controlled Increase Synthroid to 199 mcg once daily in am.  Recheck TSH and adjust Synthroid as indicated       Relevant Orders   T4, free (Completed)   TSH (Completed)   Diabetic glomerulopathy (HCC)    Control: uncontrolled.  Improved, but not at goal. Recommend check sugars twice a day. Recommend check feet daily. Recommend annual eye exams. Medicines:  Increase tresiba to 30 U daily.  Continue rybelsus 7 mg once daily and xigduo xr 01/999 mg  2 daily.  Bring sugar log when she comes back to have thyroid checked.  Protein spilling in urine has improved.  Continue to work on eating a healthy diet and exercise.  Labs drawn today.  Relevant Medications   insulin degludec (TRESIBA FLEXTOUCH) 200 UNIT/ML FlexTouch Pen   Other Relevant Orders   Hemoglobin A1c (Completed)   Microalbumin / creatinine urine ratio (Completed)   RESOLVED: Mixed diabetic hyperlipidemia associated with type 2 diabetes mellitus ([place]) - Primary    Well controlled.  No changes to medicines. Continue Vascepa 1 g 2 capsules twice daily, fenofibrate 160 mg once daily and rosuvastatin 20 mg nightly.  Continue to work on eating a healthy diet and exercise.  Labs drawn today.        Relevant Medications   insulin degludec (TRESIBA FLEXTOUCH) 200 UNIT/ML FlexTouch Pen     Other   Mixed hyperlipidemia    Well controlled.  No changes to medicines. currently on Vascepa 1 g 2 capsules twice daily, fenofibrate 160 mg once daily and rosuvastatin 20 mg nightly Continue to work on eating a healthy diet and exercise.  Labs drawn today.       Relevant Orders   Lipid panel (Completed)   Class 1 obesity due to excess calories with serious comorbidity and body mass index (BMI) of 33.0 to 33.9 in adult    Recommend continue to work on eating healthy diet and exercise.       Relevant Medications   insulin degludec (TRESIBA FLEXTOUCH) 200 UNIT/ML FlexTouch Pen   Mild recurrent major depression (HCC)    The current medical regimen is effective;  continue present plan and medications. citalopram 20 mg once daily     .  Meds ordered this encounter  Medications   DISCONTD: insulin degludec (TRESIBA FLEXTOUCH) 200 UNIT/ML FlexTouch Pen    Sig: Inject 20 Units into the skin at bedtime.    Dispense:  3 mL    Refill:  0   Insulin Pen Needle 32G X 4 MM MISC    Sig: 1 Needle by Does not apply route daily.    Dispense:  100 each    Refill:  3   insulin  degludec (TRESIBA FLEXTOUCH) 200 UNIT/ML FlexTouch Pen    Sig: Inject 30 Units into the skin at bedtime.    Dispense:  1 mL    Refill:  0    Orders Placed This Encounter  Procedures   CBC with Differential/Platelet   Comprehensive metabolic panel   Lipid panel   Hemoglobin A1c   T4, free   TSH   Microalbumin / creatinine urine ratio   Cardiovascular Risk Assessment    I,Seneca Hoback,acting as a scribe for [name], MD.,have documented all relevant documentation on the behalf of [name], MD,as directed by  [name], MD while in the presence of [name], MD.   Follow-up: Return in about 3 months (around 10/17/2022) for chronic fasting.  An After Visit Summary was printed and given to the patient.  [name], MD [facility] [phone]

## 2022-07-18 LAB — COMPREHENSIVE METABOLIC PANEL
ALT: 24 IU/L (ref 0–32)
AST: 35 IU/L (ref 0–40)
Albumin/Globulin Ratio: 1.6 (ref 1.2–2.2)
Albumin: 4.6 g/dL (ref 3.9–4.9)
Alkaline Phosphatase: 59 IU/L (ref 44–121)
BUN/Creatinine Ratio: 19 (ref 12–28)
BUN: 19 mg/dL (ref 8–27)
Bilirubin Total: 0.6 mg/dL (ref 0.0–1.2)
CO2: 24 mmol/L (ref 20–29)
Calcium: 9.8 mg/dL (ref 8.7–10.3)
Chloride: 101 mmol/L (ref 96–106)
Creatinine, Ser: 0.98 mg/dL (ref 0.57–1.00)
Globulin, Total: 2.8 g/dL (ref 1.5–4.5)
Glucose: 100 mg/dL — ABNORMAL HIGH (ref 70–99)
Potassium: 4.4 mmol/L (ref 3.5–5.2)
Sodium: 139 mmol/L (ref 134–144)
Total Protein: 7.4 g/dL (ref 6.0–8.5)
eGFR: 64 mL/min/{1.73_m2} (ref >59)

## 2022-07-18 LAB — CBC WITH DIFFERENTIAL/PLATELET
Basophils Absolute: 0.1 10*3/uL (ref 0.0–0.2)
Basos: 1 %
EOS (ABSOLUTE): 0.2 10*3/uL (ref 0.0–0.4)
Eos: 3 %
Hematocrit: 40.4 % (ref 34.0–46.6)
Hemoglobin: 12.9 g/dL (ref 11.1–15.9)
Immature Grans (Abs): 0 10*3/uL (ref 0.0–0.1)
Immature Granulocytes: 0 %
Lymphocytes Absolute: 3.8 10*3/uL — ABNORMAL HIGH (ref 0.7–3.1)
Lymphs: 42 %
MCH: 26.2 pg — ABNORMAL LOW (ref 26.6–33.0)
MCHC: 31.9 g/dL (ref 31.5–35.7)
MCV: 82 fL (ref 79–97)
Monocytes Absolute: 0.6 10*3/uL (ref 0.1–0.9)
Monocytes: 7 %
Neutrophils Absolute: 4.3 10*3/uL (ref 1.4–7.0)
Neutrophils: 47 %
Platelets: 265 10*3/uL (ref 150–450)
RBC: 4.92 x10E6/uL (ref 3.77–5.28)
RDW: 13.3 % (ref 11.7–15.4)
WBC: 9 10*3/uL (ref 3.4–10.8)

## 2022-07-18 LAB — HEMOGLOBIN A1C
Est. average glucose Bld gHb Est-mCnc: 212 mg/dL
Hgb A1c MFr Bld: 9 % — ABNORMAL HIGH (ref 4.8–5.6)

## 2022-07-18 LAB — LIPID PANEL
Chol/HDL Ratio: 2.7 ratio (ref 0.0–4.4)
Cholesterol, Total: 109 mg/dL (ref 100–199)
HDL: 40 mg/dL (ref >39)
LDL Chol Calc (NIH): 50 mg/dL (ref 0–99)
Triglycerides: 102 mg/dL (ref 0–149)
VLDL Cholesterol Cal: 19 mg/dL (ref 5–40)

## 2022-07-18 LAB — T4, FREE: Free T4: 1.74 ng/dL (ref 0.82–1.77)

## 2022-07-18 LAB — CARDIOVASCULAR RISK ASSESSMENT

## 2022-07-18 LAB — TSH: TSH: 0.297 u[IU]/mL — ABNORMAL LOW (ref 0.450–4.500)

## 2022-07-18 LAB — MICROALBUMIN / CREATININE URINE RATIO
Creatinine, Urine: 80 mg/dL
Microalb/Creat Ratio: 18 mg/g creat (ref 0–29)
Microalbumin, Urine: 14.3 ug/mL

## 2022-07-20 NOTE — Progress Notes (Signed)
Blood count normal.  Liver function normal.  Kidney function normal.  Thyroid function abnormal. Decrease to 100 mcg once daily in am. Recheck in 6 weeks.  Cholesterol: good HBA1C: 9. Improved, but not at goal. Increase tresiba to 30 U daily. Check sugars twice daily before breakfast and supper. Bring sugar log when she comes back to have thyroid checked.  Protein spilling in urine has improved.

## 2022-07-21 ENCOUNTER — Other Ambulatory Visit: Payer: Self-pay

## 2022-07-21 DIAGNOSIS — E039 Hypothyroidism, unspecified: Secondary | ICD-10-CM

## 2022-07-21 MED ORDER — LEVOTHYROXINE SODIUM 100 MCG PO TABS: 100.0000 ug | ORAL_TABLET | Freq: Every day | ORAL | 2 refills | Status: DC

## 2022-07-21 MED ORDER — RYBELSUS 7 MG PO TABS: 7.0000 mg | ORAL_TABLET | Freq: Every day | ORAL | 3 refills | Status: DC

## 2022-07-23 ENCOUNTER — Other Ambulatory Visit: Payer: Self-pay

## 2022-07-23 MED ORDER — RYBELSUS 7 MG PO TABS: 7.0000 mg | ORAL_TABLET | Freq: Every day | ORAL | 3 refills | Status: DC

## 2022-07-24 NOTE — Assessment & Plan Note (Addendum)
Well controlled.  No changes to medicines. Continue Vascepa 1 g 2 capsules twice daily, fenofibrate 160 mg once daily and rosuvastatin 20 mg nightly.  Continue to work on eating a healthy diet and exercise.  Labs drawn today.

## 2022-07-24 NOTE — Assessment & Plan Note (Addendum)
Control: uncontrolled.  Improved, but not at goal. Recommend check sugars twice a day. Recommend check feet daily. Recommend annual eye exams. Medicines:  Increase tresiba to 30 U daily.  Continue rybelsus 7 mg once daily and xigduo xr 01/999 mg 2 daily.  Bring sugar log when she comes back to have thyroid checked.  Protein spilling in urine has improved.  Continue to work on eating a healthy diet and exercise.  Labs drawn today.

## 2022-07-24 NOTE — Assessment & Plan Note (Signed)
The current medical regimen is effective;  continue present plan and medications. citalopram 20 mg once daily

## 2022-07-24 NOTE — Assessment & Plan Note (Addendum)
Previously well controlled Increase Synthroid to 199 mcg once daily in am.  Recheck TSH and adjust Synthroid as indicated

## 2022-07-24 NOTE — Assessment & Plan Note (Signed)
Well controlled.  No changes to medicines. currently on Vascepa 1 g 2 capsules twice daily, fenofibrate 160 mg once daily and rosuvastatin 20 mg nightly Continue to work on eating a healthy diet and exercise.  Labs drawn today.

## 2022-07-24 NOTE — Assessment & Plan Note (Addendum)
Well controlled.  No changes to medicines.  lisinopril 10 mg 1/2 daily.  Continue to work on eating a healthy diet and exercise.  Labs drawn today.   

## 2022-07-25 ENCOUNTER — Encounter: Payer: Self-pay | Admitting: Family Medicine

## 2022-07-25 MED ORDER — TRESIBA FLEXTOUCH 200 UNIT/ML ~~LOC~~ SOPN: 30.0000 [IU] | PEN_INJECTOR | Freq: Every day | SUBCUTANEOUS | 0 refills | Status: DC

## 2022-07-25 NOTE — Assessment & Plan Note (Signed)
Recommend continue to work on eating healthy diet and exercise.  

## 2022-08-19 ENCOUNTER — Other Ambulatory Visit: Payer: Self-pay | Admitting: Family Medicine

## 2022-08-27 ENCOUNTER — Encounter: Payer: Self-pay | Admitting: Family Medicine

## 2022-08-27 ENCOUNTER — Telehealth: Payer: Managed Care, Other (non HMO) | Admitting: Family Medicine

## 2022-08-27 DIAGNOSIS — U071 COVID-19: Secondary | ICD-10-CM | POA: Diagnosis not present

## 2022-08-27 DIAGNOSIS — J069 Acute upper respiratory infection, unspecified: Secondary | ICD-10-CM

## 2022-08-27 HISTORY — DX: Acute upper respiratory infection, unspecified: J06.9

## 2022-08-27 HISTORY — DX: COVID-19: U07.1

## 2022-08-27 MED ORDER — MOLNUPIRAVIR EUA 200MG CAPSULE: 4.0000 | ORAL_CAPSULE | Freq: Two times a day (BID) | ORAL | 0 refills | Status: AC

## 2022-08-27 NOTE — Progress Notes (Signed)
Virtual Visit via Telephone Note   This visit type was conducted due to national recommendations for restrictions regarding the COVID-19 Pandemic (e.g. social distancing) in an effort to limit this patient's exposure and mitigate transmission in our community.  Due to her co-morbid illnesses, this patient is at least at moderate risk for complications without adequate follow up.  This format is felt to be most appropriate for this patient at this time.  The patient did not have access to video technology/had technical difficulties with video requiring transitioning to audio format only (telephone).  All issues noted in this document were discussed and addressed.  No physical exam could be performed with this format.  Patient verbally consented to a telehealth visit.   Date:  08/27/2022   ID:  Robin Bonilla, DOB 05-15-1956, MRN [id number]  Patient Location: Home Provider Location: Office/Clinic  PCP:  Blane Ohara, MD   Evaluation Performed:  acute  Chief Complaint:  URI  History of Present Illness:    Robin Bonilla is a 66 y.o. female with congestion, headache, mild fever. No cough, shortness of breath, sore throat. Patient has not been checking her sugars. Husband positive for covid 19 today.   The patient does have symptoms concerning for COVID-19 infection (fever, chills, cough, or new shortness of breath).    Past Medical History:  Diagnosis Date   Acute sinusitis    Depression    Diabetes mellitus 2005   Dysthymic disorder    Hirsutism    Hyperlipidemia    Rash, skin    Thyroid disease     Past Surgical History:  Procedure Laterality Date   BACK SURGERY     CHOLECYSTECTOMY  05/30/11   PARTIAL HYSTERECTOMY     TONSILLECTOMY     TUBAL LIGATION      Family History  Problem Relation Age of Onset   Breast cancer Mother 26   CAD Father    CAD Maternal Grandmother    Hypertension Maternal Grandmother    Diabetes Paternal Grandfather    Diabetes  Paternal Aunt     Social History   Socioeconomic History   Marital status: Married    Spouse name: Not on file   Number of children: Not on file   Years of education: Not on file   Highest education level: Not on file  Occupational History   Not on file  Tobacco Use   Smoking status: Never   Smokeless tobacco: Never  Substance and Sexual Activity   Alcohol use: No    Alcohol/week: 0.0 standard drinks of alcohol   Drug use: No   Sexual activity: Not on file  Other Topics Concern   Not on file  Social History Narrative   Not on file   Social Determinants of Health   Financial Resource Strain: Not on file  Food Insecurity: Not on file  Transportation Needs: Not on file  Physical Activity: Not on file  Stress: Not on file  Social Connections: Not on file  Intimate Partner Violence: Not on file    Outpatient Medications Prior to Visit  Medication Sig Dispense Refill   ACCU-CHEK GUIDE test strip TEST STRIPS TWICE A DAY 100 each 3   citalopram (CELEXA) 20 MG tablet TAKE 1 TABLET BY MOUTH EVERY DAY 90 tablet 1   Dapagliflozin-metFORMIN HCl ER ([name]) 01-999 MG TB24 Take 2 tablets by mouth daily. STOP METFORMIN. 180 tablet 0   diclofenac (VOLTAREN) 75 MG EC tablet Take 1 tablet (75 mg  total) by mouth 2 (two) times daily. 30 tablet 2   fenofibrate 160 MG tablet TAKE 1 TABLET BY MOUTH EVERY DAY 90 tablet 0   icosapent Ethyl (VASCEPA) 1 g capsule TAKE 2 CAPSULES BY MOUTH 2 TIMES DAILY. 360 capsule 2   insulin degludec (TRESIBA FLEXTOUCH) 200 UNIT/ML FlexTouch Pen Inject 30 Units into the skin at bedtime. 1 mL 0   Insulin Pen Needle 32G X 4 MM MISC 1 Needle by Does not apply route daily. 100 each 3   levothyroxine (SYNTHROID) 100 MCG tablet TAKE 1 TABLET BY MOUTH DAILY BEFORE BREAKFAST. 30 tablet 0   lisinopril (ZESTRIL) 10 MG tablet Take 1 tablet (10 mg total) by mouth daily. (Patient taking differently: Take 5 mg by mouth daily.) 90 tablet 0   ondansetron (ZOFRAN) 4 MG  tablet Take 1 tablet (4 mg total) by mouth every 6 (six) hours as needed for nausea. 20 tablet 0   rosuvastatin (CRESTOR) 20 MG tablet TAKE 1 TABLET BY MOUTH EVERY DAY 90 tablet 1   Semaglutide ([name]) 7 MG TABS Take 7 mg by mouth daily. 30 tablet 3   triamcinolone cream (KENALOG) 0.5 % Apply 1 application. topically 2 (two) times daily.     No facility-administered medications prior to visit.    Allergies:   Lunesta [eszopiclone], Penicillins, and Simvastatin   Social History   Tobacco Use   Smoking status: Never   Smokeless tobacco: Never  Substance Use Topics   Alcohol use: No    Alcohol/week: 0.0 standard drinks of alcohol   Drug use: No     Review of Systems  Constitutional:  Positive for fever.  HENT:  Positive for congestion. Negative for ear pain and sore throat.   Respiratory:  Negative for cough and shortness of breath.   Cardiovascular:  Negative for chest pain.     Labs/Other Tests and Data Reviewed:    Recent Labs: 07/17/2022: ALT 24; BUN 19; Creatinine, Ser 0.98; Hemoglobin 12.9; Platelets 265; Potassium 4.4; Sodium 139; TSH 0.297   Recent Lipid Panel Lab Results  Component Value Date/Time   CHOL 109 07/17/2022 02:26 PM   TRIG 102 07/17/2022 02:26 PM   HDL 40 07/17/2022 02:26 PM   CHOLHDL 2.7 07/17/2022 02:26 PM   LDLCALC 50 07/17/2022 02:26 PM    Wt Readings from Last 3 Encounters:  07/17/22 171 lb (77.6 kg)  05/28/22 165 lb (74.8 kg)  04/09/22 167 lb (75.8 kg)     Objective:    Vital Signs:  patient unable to do.    Physical Exam not in respiratory distress over the phone.  ASSESSMENT & PLAN:   1. Upper respiratory tract infection due to COVID-19 virus    No orders of the defined types were placed in this encounter.    Meds ordered this encounter  Medications   molnupiravir EUA (LAGEVRIO) 200 mg CAPS capsule    Sig: Take 4 capsules (800 mg total) by mouth 2 (two) times daily for 5 days.    Dispense:  40 capsule    Refill:  0     COVID-19 Education: The signs and symptoms of COVID-19 were discussed with the patient and how to seek care for testing (follow up with PCP or arrange E-visit). The importance of social distancing was discussed today.   I spent 8 minutes dedicated to the care of this patient on the date of this encounter to include audio time with the patient.  Follow Up:  In Person prn  Signed,  Blane Ohara, MD  08/27/2022 4:02 PM    [facility] [place]

## 2022-08-27 NOTE — Assessment & Plan Note (Signed)
Presumed positive based on symptoms and exposure to her husband.  You should remain isolated and quarantine for at least 5 days from start of symptoms. You must be feeling better and be fever free without any fever reducers for at least 24 hours as well. You should wear a mask at all times when out of your home or around others for 5 days after leaving isolation.  Your household contacts should be tested as well as work contacts. If you feel worse or have increasing shortness of breath, you should be seen in person at urgent care or the emergency room.   Rx: molnupiravir. Recommend rest, fluids, 3 meals per day.  Fever reducing medicines.  OTC cold and congestion medicines.

## 2022-08-27 NOTE — Patient Instructions (Signed)
Presumed positive based on symptoms and exposure to her husband.  You should remain isolated and quarantine for at least 5 days from start of symptoms. You must be feeling better and be fever free without any fever reducers for at least 24 hours as well. You should wear a mask at all times when out of your home or around others for 5 days after leaving isolation.  Your household contacts should be tested as well as work contacts. If you feel worse or have increasing shortness of breath, you should be seen in person at urgent care or the emergency room.   Rx: molnupiravir. Recommend rest, fluids, 3 meals per day.  Fever reducing medicines.  OTC cold and congestion medicines.   

## 2022-09-01 ENCOUNTER — Other Ambulatory Visit: Payer: Managed Care, Other (non HMO)

## 2022-09-11 ENCOUNTER — Other Ambulatory Visit: Payer: Self-pay | Admitting: Family Medicine

## 2022-09-11 DIAGNOSIS — E782 Mixed hyperlipidemia: Secondary | ICD-10-CM

## 2022-09-16 ENCOUNTER — Other Ambulatory Visit: Payer: Self-pay | Admitting: Family Medicine

## 2022-09-16 ENCOUNTER — Telehealth: Payer: Self-pay

## 2022-09-16 NOTE — Telephone Encounter (Signed)
Patients husband called and stated that per insurance the prescription needs to be a 90 day supply for thyroid medication.

## 2022-09-18 ENCOUNTER — Other Ambulatory Visit: Payer: Self-pay

## 2022-09-18 MED ORDER — LEVOTHYROXINE SODIUM 100 MCG PO TABS: 100.0000 ug | ORAL_TABLET | Freq: Every day | ORAL | 0 refills | Status: DC

## 2022-09-18 NOTE — Telephone Encounter (Signed)
Patient made aware, will come tomorrow for lab.

## 2022-09-19 ENCOUNTER — Other Ambulatory Visit: Payer: Managed Care, Other (non HMO)

## 2022-09-19 DIAGNOSIS — E039 Hypothyroidism, unspecified: Secondary | ICD-10-CM

## 2022-09-20 ENCOUNTER — Other Ambulatory Visit: Payer: Self-pay | Admitting: Family Medicine

## 2022-09-20 LAB — TSH: TSH: 4.2 u[IU]/mL (ref 0.450–4.500)

## 2022-09-20 LAB — T4, FREE: Free T4: 1.65 ng/dL (ref 0.82–1.77)

## 2022-09-20 MED ORDER — LEVOTHYROXINE SODIUM 100 MCG PO TABS: 100.0000 ug | ORAL_TABLET | Freq: Every day | ORAL | 3 refills | Status: DC

## 2022-10-10 ENCOUNTER — Other Ambulatory Visit: Payer: Self-pay | Admitting: Family Medicine

## 2022-10-13 ENCOUNTER — Other Ambulatory Visit: Payer: Self-pay

## 2022-10-13 MED ORDER — FENOFIBRATE 160 MG PO TABS: 160.0000 mg | ORAL_TABLET | Freq: Every day | ORAL | 1 refills | Status: DC

## 2022-10-21 ENCOUNTER — Other Ambulatory Visit: Payer: Self-pay

## 2022-10-21 MED ORDER — XIGDUO XR 5-1000 MG PO TB24: 2.0000 | ORAL_TABLET | Freq: Every day | ORAL | 0 refills | Status: DC

## 2022-10-23 ENCOUNTER — Encounter: Payer: Self-pay | Admitting: Family Medicine

## 2022-10-23 NOTE — Assessment & Plan Note (Addendum)
Well controlled.  No changes to medicines. Vascepa 1 g 2 capsules twice daily, fenofibrate 160 mg once daily and rosuvastatin 20 mg nightly.  Continue to work on eating a healthy diet and exercise.  Labs drawn today.

## 2022-10-23 NOTE — Assessment & Plan Note (Signed)
Previously well controlled Continue Synthroid at current dose  Recheck TSH and adjust Synthroid as indicated   

## 2022-10-23 NOTE — Assessment & Plan Note (Addendum)
Control: Fair Recommend check sugars fasting daily. Recommend check feet daily. Recommend annual eye exams. Medicines: [name] 7 mg once daily and xigduo xr 01/999 mg 2 daily.  Continue to work on eating a healthy diet and exercise.  Labs drawn today.

## 2022-10-23 NOTE — Progress Notes (Unsigned)
Subjective:  Patient ID: Robin Bonilla, female    DOB: 26-Nov-1955  Age: 67 y.o. MRN: [id number]  Chief Complaint  Patient presents with   Hypothyroidism   Diabetes   Hyperlipidemia   Depression    HPI Diabetes:  Sugars 125-160s. Checking twice daily.  Medications:: ON rybelsus 7 mg once daily and xigduo xr 01/999 mg 2 daily. ON tresiba 10 U before bed.  Utd on eye exam.  Checking feet daily.  Healthy Diet. No exercising.   Depression: on citalopram 20 mg once daily.  Struggling over the last few months since her aunt passed away.    Hyperlipidemia: : Patient is currently on Vascepa 1 g 2 capsules twice daily, fenofibrate 160 mg once daily and rosuvastatin 20 mg nightly.   Hypertension: On lisinopril 10 mg 1/2 daily.  This is also for nephro protection.     10/24/2022    8:48 AM 01/14/2022   11:23 AM 12/25/2021    9:04 AM 12/09/2021    8:52 AM 12/02/2021   12:03 PM  Depression screen PHQ 2/9  Decreased Interest 0 1 1 3 3   Down, Depressed, Hopeless 2 1 1 3    PHQ - 2 Score 2 2 2 6 3   Altered sleeping 2 2 1 2    Tired, decreased energy 3 1 1 3    Change in appetite 0 1 1 2    Feeling bad or failure about yourself  3 2 2 2    Trouble concentrating 2 1 2 3    Moving slowly or fidgety/restless 2 1 3 3    Suicidal thoughts 1 0 0 1   PHQ-9 Score 15 10 12 22    Difficult doing work/chores Very difficult Somewhat difficult Very difficult Very difficult          01/27/2022   10:15 PM 01/28/2022    9:00 AM 01/28/2022    7:25 PM 01/29/2022    8:30 AM 10/24/2022    8:48 AM  Fall Risk  Falls in the past year?     0  Was there an injury with Fall?     0  Fall Risk Category Calculator     0  (RETIRED) Patient Fall Risk Level Low fall risk Low fall risk Low fall risk Low fall risk   Patient at Risk for Falls Due to     No Fall Risks  Fall risk Follow up     Falls evaluation completed      Review of Systems  Constitutional:  Negative for chills, fatigue and fever.  HENT:  Negative  for congestion, rhinorrhea and sore throat.   Respiratory:  Negative for cough and shortness of breath.   Cardiovascular:  Negative for chest pain.  Gastrointestinal:  Positive for diarrhea and vomiting (tuesday night x 2). Negative for abdominal pain, constipation and nausea.  Genitourinary:  Negative for dysuria and urgency.  Musculoskeletal:  Negative for back pain and myalgias.  Neurological:  Negative for dizziness, weakness, light-headedness and headaches.  Psychiatric/Behavioral:  Positive for dysphoric mood. The patient is not nervous/anxious.     Current Outpatient Medications on File Prior to Visit  Medication Sig Dispense Refill   citalopram (CELEXA) 20 MG tablet TAKE 1 TABLET BY MOUTH EVERY DAY 90 tablet 1   Dapagliflozin Pro-metFORMIN ER ([name]) 01-999 MG TB24 Take 2 tablets by mouth daily. STOP METFORMIN. 180 tablet 0   fenofibrate 160 MG tablet Take 1 tablet (160 mg total) by mouth daily. 90 tablet 1   icosapent Ethyl (VASCEPA)  1 g capsule TAKE 2 CAPSULES BY MOUTH 2 TIMES DAILY. 360 capsule 2   insulin degludec (TRESIBA FLEXTOUCH) 200 UNIT/ML FlexTouch Pen INJECT 10 UNITS INTO THE SKIN AT BEDTIME. 3 mL 2   Insulin Pen Needle 32G X 4 MM MISC 1 Needle by Does not apply route daily. 100 each 3   levothyroxine (SYNTHROID) 100 MCG tablet Take 1 tablet (100 mcg total) by mouth daily before breakfast. 90 tablet 3   lisinopril (ZESTRIL) 10 MG tablet Take 1 tablet (10 mg total) by mouth daily. (Patient taking differently: Take 5 mg by mouth daily.) 90 tablet 0   ondansetron (ZOFRAN) 4 MG tablet Take 1 tablet (4 mg total) by mouth every 6 (six) hours as needed for nausea. 20 tablet 0   rosuvastatin (CRESTOR) 20 MG tablet TAKE 1 TABLET BY MOUTH EVERY DAY 90 tablet 1   Semaglutide ([name]) 7 MG TABS Take 7 mg by mouth daily. 30 tablet 3   No current facility-administered medications on file prior to visit.   Past Medical History:  Diagnosis Date   Acute sinusitis    Depression     Diabetes mellitus 2005   Dysthymic disorder    Hirsutism    Hyperlipidemia    Rash, skin    Thrombocytopenia (HCC) 09/14/2021   Thyroid disease    Upper respiratory tract infection due to COVID-19 virus 08/27/2022   Past Surgical History:  Procedure Laterality Date   BACK SURGERY     CHOLECYSTECTOMY  05/30/11   PARTIAL HYSTERECTOMY     TONSILLECTOMY     TUBAL LIGATION      Family History  Problem Relation Age of Onset   Breast cancer Mother 60   CAD Father    CAD Maternal Grandmother    Hypertension Maternal Grandmother    Diabetes Paternal Grandfather    Diabetes Paternal Aunt    Social History   Socioeconomic History   Marital status: Married    Spouse name: Not on file   Number of children: Not on file   Years of education: Not on file   Highest education level: Not on file  Occupational History   Not on file  Tobacco Use   Smoking status: Never   Smokeless tobacco: Never  Substance and Sexual Activity   Alcohol use: No    Alcohol/week: 0.0 standard drinks of alcohol   Drug use: No   Sexual activity: Not on file  Other Topics Concern   Not on file  Social History Narrative   Not on file   Social Determinants of Health   Financial Resource Strain: Not on file  Food Insecurity: Not on file  Transportation Needs: Not on file  Physical Activity: Not on file  Stress: Not on file  Social Connections: Not on file    Objective:  BP (!) 110/56   Pulse 84   Temp (!) 97.5 F (36.4 C)   Resp 16   Ht 5' (1.524 m)   Wt 175 lb (79.4 kg)   BMI 34.18 kg/m      10/24/2022    8:43 AM 07/17/2022    1:45 PM 05/28/2022    8:28 AM  BP/Weight  Systolic BP 110 110 104  Diastolic BP 56 70 58  Wt. (Lbs) 175 171   BMI 34.18 kg/m2 33.4 kg/m2     Physical Exam Vitals reviewed.  Constitutional:      Appearance: Normal appearance. She is normal weight.  Neck:     Vascular: No carotid  bruit.  Cardiovascular:     Rate and Rhythm: Normal rate and regular  rhythm.     Heart sounds: Normal heart sounds.  Pulmonary:     Effort: Pulmonary effort is normal. No respiratory distress.     Breath sounds: Normal breath sounds.  Abdominal:     General: Abdomen is flat. Bowel sounds are normal.     Palpations: Abdomen is soft.     Tenderness: There is no abdominal tenderness.  Neurological:     Mental Status: She is alert and oriented to person, place, and time.  Psychiatric:        Mood and Affect: Mood normal.        Behavior: Behavior normal.     Diabetic Foot Exam - Simple   Simple Foot Form Diabetic Foot exam was performed with the following findings: Yes 10/24/2022  9:19 AM  Visual Inspection No deformities, no ulcerations, no other skin breakdown bilaterally: Yes Sensation Testing Intact to touch and monofilament testing bilaterally: Yes Pulse Check Posterior Tibialis and Dorsalis pulse intact bilaterally: Yes Comments      Lab Results  Component Value Date   WBC 9.0 07/17/2022   HGB 12.9 07/17/2022   HCT 40.4 07/17/2022   PLT 265 07/17/2022   GLUCOSE 100 (H) 07/17/2022   CHOL 109 07/17/2022   TRIG 102 07/17/2022   HDL 40 07/17/2022   LDLCALC 50 07/17/2022   ALT 24 07/17/2022   AST 35 07/17/2022   NA 139 07/17/2022   K 4.4 07/17/2022   CL 101 07/17/2022   CREATININE 0.98 07/17/2022   BUN 19 07/17/2022   CO2 24 07/17/2022   TSH 4.200 09/19/2022   HGBA1C 9.0 (H) 07/17/2022   MICROALBUR 80 09/26/2021      Assessment & Plan:    Hypertension associated with diabetes ([place]) Assessment & Plan: Well controlled.  No changes to medicines.  lisinopril 10 mg 1/2 daily.  Continue to work on eating a healthy diet and exercise.  Labs drawn today.    Orders: -     Comprehensive metabolic panel -     CBC with Differential/Platelet  Hypothyroidism (acquired) Assessment & Plan: Previously well controlled Continue Synthroid at current dose  Recheck TSH and adjust Synthroid as indicated     Diabetic glomerulopathy  ([place]) Assessment & Plan: Control: Await labs/testing for assessment and recommendations. Recommend check sugars fasting daily. Recommend check feet daily. Recommend annual eye exams. Medicines: [name] 7 mg once daily, xigduo xr 01/999 mg 2 daily, continue tresiba 10 U before bed. .  Continue to work on eating a healthy diet and exercise.  Labs drawn today.     Orders: -     Hemoglobin A1c -     Microalbumin / creatinine urine ratio  Mixed hyperlipidemia Assessment & Plan: Well controlled.  No changes to medicines. Vascepa 1 g 2 capsules twice daily, fenofibrate 160 mg once daily and rosuvastatin 20 mg nightly.  Continue to work on eating a healthy diet and exercise.  Labs drawn today.    Orders: -     Lipid panel  Class 1 obesity due to excess calories with serious comorbidity and body mass index (BMI) of 34.0 to 34.9 in adult Assessment & Plan: Recommend continue to work on eating healthy diet and exercise.    Moderate recurrent major depression ([place]) Assessment & Plan: Patient prefers not to change dose of her medicine.  Not at goal.   Other orders -     Accu-Chek Guide; TEST  STRIPS TWICE A DAY  Dispense: 200 each; Refill: 3     Meds ordered this encounter  Medications   ACCU-CHEK GUIDE test strip    Sig: TEST STRIPS TWICE A DAY    Dispense:  200 each    Refill:  3    DX E11.59, Last A1C 9    Orders Placed This Encounter  Procedures   Comprehensive metabolic panel   Hemoglobin A1c   Lipid panel   CBC with Differential/Platelet   Microalbumin / creatinine urine ratio     Follow-up: Return in about 3 months (around 01/23/2023) for chronic fasting.  An After Visit Summary was printed and given to the patient.  Clayborn Bigness I Leal-Borjas,acting as a scribe for Blane Ohara, MD.,have documented all relevant documentation on the behalf of Blane Ohara, MD,as directed by  Blane Ohara, MD while in the presence of Blane Ohara, MD.   Blane Ohara, MD [facility] [phone]

## 2022-10-23 NOTE — Assessment & Plan Note (Addendum)
Well controlled.  No changes to medicines.  lisinopril 10 mg 1/2 daily.  Continue to work on eating a healthy diet and exercise.  Labs drawn today.

## 2022-10-24 ENCOUNTER — Ambulatory Visit: Payer: Managed Care, Other (non HMO) | Admitting: Family Medicine

## 2022-10-24 VITALS — BP 110/56 | HR 84 | Temp 97.5°F | Resp 16 | Ht 60.0 in | Wt 175.0 lb

## 2022-10-24 DIAGNOSIS — I152 Hypertension secondary to endocrine disorders: Secondary | ICD-10-CM

## 2022-10-24 DIAGNOSIS — Z6834 Body mass index (BMI) 34.0-34.9, adult: Secondary | ICD-10-CM

## 2022-10-24 DIAGNOSIS — E1159 Type 2 diabetes mellitus with other circulatory complications: Secondary | ICD-10-CM

## 2022-10-24 DIAGNOSIS — E6609 Other obesity due to excess calories: Secondary | ICD-10-CM

## 2022-10-24 DIAGNOSIS — E039 Hypothyroidism, unspecified: Secondary | ICD-10-CM | POA: Diagnosis not present

## 2022-10-24 DIAGNOSIS — E782 Mixed hyperlipidemia: Secondary | ICD-10-CM | POA: Diagnosis not present

## 2022-10-24 DIAGNOSIS — E1121 Type 2 diabetes mellitus with diabetic nephropathy: Secondary | ICD-10-CM | POA: Diagnosis not present

## 2022-10-24 DIAGNOSIS — F331 Major depressive disorder, recurrent, moderate: Secondary | ICD-10-CM

## 2022-10-24 MED ORDER — ACCU-CHEK GUIDE VI STRP: ORAL_STRIP | 3 refills | Status: DC

## 2022-10-26 ENCOUNTER — Encounter: Payer: Self-pay | Admitting: Family Medicine

## 2022-10-26 NOTE — Assessment & Plan Note (Signed)
Patient prefers not to change dose of her medicine.  Not at goal.

## 2022-10-26 NOTE — Assessment & Plan Note (Signed)
Recommend continue to work on eating healthy diet and exercise.  

## 2022-10-27 LAB — CARDIOVASCULAR RISK ASSESSMENT

## 2022-10-27 LAB — LIPID PANEL
Chol/HDL Ratio: 3.3 ratio (ref 0.0–4.4)
Cholesterol, Total: 112 mg/dL (ref 100–199)
HDL: 34 mg/dL — ABNORMAL LOW (ref >39)
LDL Chol Calc (NIH): 55 mg/dL (ref 0–99)
Triglycerides: 129 mg/dL (ref 0–149)
VLDL Cholesterol Cal: 23 mg/dL (ref 5–40)

## 2022-10-27 LAB — COMPREHENSIVE METABOLIC PANEL
ALT: 22 IU/L (ref 0–32)
AST: 25 IU/L (ref 0–40)
Albumin/Globulin Ratio: 1.6 (ref 1.2–2.2)
Albumin: 4.4 g/dL (ref 3.9–4.9)
Alkaline Phosphatase: 56 IU/L (ref 44–121)
BUN/Creatinine Ratio: 19 (ref 12–28)
BUN: 18 mg/dL (ref 8–27)
Bilirubin Total: 0.9 mg/dL (ref 0.0–1.2)
CO2: 22 mmol/L (ref 20–29)
Calcium: 9.7 mg/dL (ref 8.7–10.3)
Chloride: 99 mmol/L (ref 96–106)
Creatinine, Ser: 0.93 mg/dL (ref 0.57–1.00)
Globulin, Total: 2.7 g/dL (ref 1.5–4.5)
Glucose: 111 mg/dL — ABNORMAL HIGH (ref 70–99)
Potassium: 4.1 mmol/L (ref 3.5–5.2)
Sodium: 138 mmol/L (ref 134–144)
Total Protein: 7.1 g/dL (ref 6.0–8.5)
eGFR: 69 mL/min/{1.73_m2} (ref >59)

## 2022-10-27 LAB — CBC WITH DIFFERENTIAL/PLATELET
Basophils Absolute: 0 10*3/uL (ref 0.0–0.2)
Basos: 0 %
EOS (ABSOLUTE): 0.3 10*3/uL (ref 0.0–0.4)
Eos: 3 %
Hematocrit: 42.4 % (ref 34.0–46.6)
Hemoglobin: 14 g/dL (ref 11.1–15.9)
Immature Grans (Abs): 0 10*3/uL (ref 0.0–0.1)
Immature Granulocytes: 0 %
Lymphocytes Absolute: 3.7 10*3/uL — ABNORMAL HIGH (ref 0.7–3.1)
Lymphs: 39 %
MCH: 26.6 pg (ref 26.6–33.0)
MCHC: 33 g/dL (ref 31.5–35.7)
MCV: 81 fL (ref 79–97)
Monocytes Absolute: 0.6 10*3/uL (ref 0.1–0.9)
Monocytes: 6 %
Neutrophils Absolute: 5 10*3/uL (ref 1.4–7.0)
Neutrophils: 52 %
Platelets: 283 10*3/uL (ref 150–450)
RBC: 5.26 x10E6/uL (ref 3.77–5.28)
RDW: 13.6 % (ref 11.7–15.4)
WBC: 9.6 10*3/uL (ref 3.4–10.8)

## 2022-10-27 LAB — MICROALBUMIN / CREATININE URINE RATIO
Creatinine, Urine: 89.5 mg/dL
Microalb/Creat Ratio: 16 mg/g creat (ref 0–29)
Microalbumin, Urine: 14.3 ug/mL

## 2022-10-27 LAB — HEMOGLOBIN A1C
Est. average glucose Bld gHb Est-mCnc: 212 mg/dL
Hgb A1c MFr Bld: 9 % — ABNORMAL HIGH (ref 4.8–5.6)

## 2022-10-27 NOTE — Progress Notes (Signed)
Blood count normal.  Liver function normal.  Kidney function normal.  Cholesterol: At goal other than HDL too low.  Recommend healthy diet and exercise. HBA1C: Very high at 9.  Unchanged from last visit.  Recommend increase Tresiba to 16 units nightly.  Recommend increase by 2 units every 3 days until sugars are less than 150. Continue other diabetes medications ([name] 7 mg once daily, xigduo xr 01/999 mg 2 daily). Not spilling protein now.

## 2022-11-28 ENCOUNTER — Other Ambulatory Visit: Payer: Self-pay | Admitting: Family Medicine

## 2022-11-28 DIAGNOSIS — E1169 Type 2 diabetes mellitus with other specified complication: Secondary | ICD-10-CM

## 2022-11-28 MED ORDER — TRESIBA FLEXTOUCH 200 UNIT/ML ~~LOC~~ SOPN: 10.0000 [IU] | PEN_INJECTOR | Freq: Every day | SUBCUTANEOUS | 2 refills | Status: DC

## 2022-12-02 ENCOUNTER — Encounter: Payer: Self-pay | Admitting: Family Medicine

## 2022-12-02 ENCOUNTER — Ambulatory Visit (INDEPENDENT_AMBULATORY_CARE_PROVIDER_SITE_OTHER): Payer: PPO | Admitting: Family Medicine

## 2022-12-02 VITALS — BP 110/60 | HR 99 | Temp 96.9°F | Ht 60.0 in | Wt 174.0 lb

## 2022-12-02 DIAGNOSIS — E1169 Type 2 diabetes mellitus with other specified complication: Secondary | ICD-10-CM

## 2022-12-02 DIAGNOSIS — E782 Mixed hyperlipidemia: Secondary | ICD-10-CM | POA: Diagnosis not present

## 2022-12-02 DIAGNOSIS — H6993 Unspecified Eustachian tube disorder, bilateral: Secondary | ICD-10-CM | POA: Diagnosis not present

## 2022-12-02 DIAGNOSIS — J018 Other acute sinusitis: Secondary | ICD-10-CM | POA: Diagnosis not present

## 2022-12-02 MED ORDER — AZITHROMYCIN 250 MG PO TABS: ORAL_TABLET | ORAL | 0 refills | Status: AC

## 2022-12-02 MED ORDER — TRESIBA FLEXTOUCH 200 UNIT/ML ~~LOC~~ SOPN: 20.0000 [IU] | PEN_INJECTOR | Freq: Every day | SUBCUTANEOUS | 0 refills | Status: DC

## 2022-12-02 MED ORDER — FLUTICASONE PROPIONATE 50 MCG/ACT NA SUSP: 2.0000 | Freq: Every day | NASAL | 1 refills | Status: DC

## 2022-12-02 NOTE — Progress Notes (Signed)
Acute Office Visit  Subjective:    Patient ID: Robin Bonilla, female    DOB: 12/16/1955, 67 y.o.   MRN: [id number]  Chief Complaint  Patient presents with   Ear Pain    HPI: Patient is in today for BL ear pain x 1 weeks states her ears feel full and has some dizziness and feels nauseous. Did have vomiting last week which has resolved. Could not find medication for vertigo.  Past Medical History:  Diagnosis Date   Acute sinusitis    Depression    Diabetes mellitus 2005   Dysthymic disorder    Hirsutism    Hyperlipidemia    Rash, skin    Thrombocytopenia ([place]) 09/14/2021   Thyroid disease    Upper respiratory tract infection due to COVID-19 virus 08/27/2022    Past Surgical History:  Procedure Laterality Date   BACK SURGERY     CHOLECYSTECTOMY  05/30/11   PARTIAL HYSTERECTOMY     TONSILLECTOMY     TUBAL LIGATION      Family History  Problem Relation Age of Onset   Breast cancer Mother 51   CAD Father    CAD Maternal Grandmother    Hypertension Maternal Grandmother    Diabetes Paternal Grandfather    Diabetes Paternal Aunt     Social History   Socioeconomic History   Marital status: Married    Spouse name: Not on file   Number of children: Not on file   Years of education: Not on file   Highest education level: Not on file  Occupational History   Not on file  Tobacco Use   Smoking status: Never   Smokeless tobacco: Never  Substance and Sexual Activity   Alcohol use: No    Alcohol/week: 0.0 standard drinks of alcohol   Drug use: No   Sexual activity: Not on file  Other Topics Concern   Not on file  Social History Narrative   Not on file   Social Determinants of Health   Financial Resource Strain: Low Risk  (12/02/2022)   Overall Financial Resource Strain (CARDIA)    Difficulty of Paying Living Expenses: Not hard at all  Food Insecurity: No Food Insecurity (12/02/2022)   Hunger Vital Sign    Worried About Running Out of Food in the Last  Year: Never true    Ran Out of Food in the Last Year: Never true  Transportation Needs: No Transportation Needs (12/02/2022)   PRAPARE - Hydrologist (Medical): No    Lack of Transportation (Non-Medical): No  Physical Activity: Inactive (12/02/2022)   Exercise Vital Sign    Days of Exercise per Week: 0 days    Minutes of Exercise per Session: 0 min  Stress: Stress Concern Present (12/02/2022)   [place]    Feeling of Stress : To some extent  Social Connections: Moderately Integrated (12/02/2022)   Social Connection and Isolation Panel [NHANES]    Frequency of Communication with Friends and Family: More than three times a week    Frequency of Social Gatherings with Friends and Family: More than three times a week    Attends Religious Services: More than 4 times per year    Active Member of [employer] or Organizations: No    Attends Archivist Meetings: Never    Marital Status: Married  Human resources officer Violence: Not At Risk (12/02/2022)   Humiliation, Afraid, Rape, and Kick questionnaire  Fear of Current or Ex-Partner: No    Emotionally Abused: No    Physically Abused: No    Sexually Abused: No    Outpatient Medications Prior to Visit  Medication Sig Dispense Refill   ACCU-CHEK GUIDE test strip TEST STRIPS TWICE A DAY 200 each 3   citalopram (CELEXA) 20 MG tablet TAKE 1 TABLET BY MOUTH EVERY DAY 90 tablet 1   Dapagliflozin Pro-metFORMIN ER ([name]) 01-999 MG TB24 Take 2 tablets by mouth daily. STOP METFORMIN. 180 tablet 0   fenofibrate 160 MG tablet Take 1 tablet (160 mg total) by mouth daily. 90 tablet 1   icosapent Ethyl (VASCEPA) 1 g capsule TAKE 2 CAPSULES BY MOUTH 2 TIMES DAILY. 360 capsule 2   Insulin Pen Needle 32G X 4 MM MISC 1 Needle by Does not apply route daily. 100 each 3   levothyroxine (SYNTHROID) 100 MCG tablet Take 1 tablet (100 mcg total) by mouth daily before  breakfast. 90 tablet 3   lisinopril (ZESTRIL) 10 MG tablet Take 1 tablet (10 mg total) by mouth daily. (Patient taking differently: Take 5 mg by mouth daily.) 90 tablet 0   ondansetron (ZOFRAN) 4 MG tablet Take 1 tablet (4 mg total) by mouth every 6 (six) hours as needed for nausea. 20 tablet 0   rosuvastatin (CRESTOR) 20 MG tablet TAKE 1 TABLET BY MOUTH EVERY DAY 90 tablet 1   Semaglutide ([name]) 7 MG TABS Take 7 mg by mouth daily. 30 tablet 3   insulin degludec (TRESIBA FLEXTOUCH) 200 UNIT/ML FlexTouch Pen Inject 10 Units into the skin at bedtime. 3 mL 2   No facility-administered medications prior to visit.    Allergies  Allergen Reactions   Lunesta [Eszopiclone] Other (See Comments)    Fatigue.    Penicillins Rash   Simvastatin Other (See Comments)    nightmares     Review of Systems  Constitutional:  Negative for chills, fatigue and fever.  HENT:  Negative for congestion, ear pain, postnasal drip, rhinorrhea, sinus pressure, sinus pain and sore throat.   Respiratory:  Negative for cough and shortness of breath.   Cardiovascular:  Negative for chest pain.  Gastrointestinal:  Positive for nausea and vomiting. Negative for diarrhea.  Neurological:  Positive for dizziness. Negative for headaches.       Objective:        12/02/2022    3:59 PM 10/24/2022    8:43 AM 07/17/2022    1:45 PM  Vitals with BMI  Height 5\' 0"  5\' 0"  5\' 0"   Weight 174 lbs 175 lbs 171 lbs  BMI 33.98 55.73 22.0  Systolic 254 270 623  Diastolic 60 56 70  Pulse 99 84 84    No data found.   Physical Exam Vitals reviewed.  Constitutional:      Appearance: Normal appearance.  HENT:     Right Ear: Tympanic membrane, ear canal and external ear normal.     Left Ear: Tympanic membrane, ear canal and external ear normal.     Nose: Congestion present.     Comments: Sinus tenderness     Mouth/Throat:     Pharynx: Oropharynx is clear.  Cardiovascular:     Rate and Rhythm: Normal rate and regular  rhythm.     Heart sounds: Normal heart sounds. No murmur heard. Pulmonary:     Effort: Pulmonary effort is normal. No respiratory distress.     Breath sounds: Normal breath sounds.  Lymphadenopathy:     Cervical: No cervical  adenopathy.  Neurological:     Mental Status: She is alert and oriented to person, place, and time.  Psychiatric:        Mood and Affect: Mood normal.        Behavior: Behavior normal.     Health Maintenance Due  Topic Date Due   Medicare Annual Wellness (AWV)  Never done   COVID-19 Vaccine (1) Never done   DTaP/Tdap/Td (1 - Tdap) Never done   Zoster Vaccines- Shingrix (1 of 2) Never done   COLONOSCOPY (Pts 45-30yrs Insurance coverage will need to be confirmed)  05/01/2022    There are no preventive care reminders to display for this patient.   Lab Results  Component Value Date   TSH 4.200 09/19/2022   Lab Results  Component Value Date   WBC 9.6 10/24/2022   HGB 14.0 10/24/2022   HCT 42.4 10/24/2022   MCV 81 10/24/2022   PLT 283 10/24/2022   Lab Results  Component Value Date   NA 138 10/24/2022   K 4.1 10/24/2022   CO2 22 10/24/2022   GLUCOSE 111 (H) 10/24/2022   BUN 18 10/24/2022   CREATININE 0.93 10/24/2022   BILITOT 0.9 10/24/2022   ALKPHOS 56 10/24/2022   AST 25 10/24/2022   ALT 22 10/24/2022   PROT 7.1 10/24/2022   ALBUMIN 4.4 10/24/2022   CALCIUM 9.7 10/24/2022   ANIONGAP 10 01/29/2022   EGFR 69 10/24/2022   GFR 70.16 11/16/2015   Lab Results  Component Value Date   CHOL 112 10/24/2022   Lab Results  Component Value Date   HDL 34 (L) 10/24/2022   Lab Results  Component Value Date   LDLCALC 55 10/24/2022   Lab Results  Component Value Date   TRIG 129 10/24/2022   Lab Results  Component Value Date   CHOLHDL 3.3 10/24/2022   Lab Results  Component Value Date   HGBA1C 9.0 (H) 10/24/2022       Assessment & Plan:  Acute non-recurrent sinusitis of other sinus Assessment & Plan: Prescription: zpack and flonase.     Mixed diabetic hyperlipidemia associated with type 2 diabetes mellitus ([place]) Assessment & Plan: Increase tresiba to 20 U daily.   Orders: -     Tyler Aas FlexTouch; Inject 20 Units into the skin at bedtime.  Dispense: 9 mL; Refill: 0  Dysfunction of both eustachian tubes Assessment & Plan: Prescription: flonase    Other orders -     Azithromycin; Take 2 tablets on day 1, then 1 tablet daily on days 2 through 5  Dispense: 6 tablet; Refill: 0 -     Fluticasone Propionate; Place 2 sprays into both nostrils daily.  Dispense: 16 g; Refill: 1     Meds ordered this encounter  Medications   azithromycin (ZITHROMAX) 250 MG tablet    Sig: Take 2 tablets on day 1, then 1 tablet daily on days 2 through 5    Dispense:  6 tablet    Refill:  0   fluticasone (FLONASE) 50 MCG/ACT nasal spray    Sig: Place 2 sprays into both nostrils daily.    Dispense:  16 g    Refill:  1   insulin degludec (TRESIBA FLEXTOUCH) 200 UNIT/ML FlexTouch Pen    Sig: Inject 20 Units into the skin at bedtime.    Dispense:  9 mL    Refill:  0    No orders of the defined types were placed in this encounter.    Follow-up: Return for  chronic fasting (already scheduled.).  An After Visit Summary was printed and given to the patient.  [name], MD [facility] [phone]

## 2022-12-07 DIAGNOSIS — H6993 Unspecified Eustachian tube disorder, bilateral: Secondary | ICD-10-CM | POA: Insufficient documentation

## 2022-12-07 DIAGNOSIS — J019 Acute sinusitis, unspecified: Secondary | ICD-10-CM | POA: Insufficient documentation

## 2022-12-07 NOTE — Assessment & Plan Note (Signed)
Prescription: flonase

## 2022-12-07 NOTE — Assessment & Plan Note (Signed)
Increase tresiba to 20 U daily.

## 2022-12-07 NOTE — Assessment & Plan Note (Signed)
Prescription: zpack and flonase.

## 2022-12-09 ENCOUNTER — Other Ambulatory Visit: Payer: Self-pay | Admitting: Family Medicine

## 2022-12-09 DIAGNOSIS — E1121 Type 2 diabetes mellitus with diabetic nephropathy: Secondary | ICD-10-CM

## 2022-12-09 DIAGNOSIS — I10 Essential (primary) hypertension: Secondary | ICD-10-CM

## 2022-12-24 ENCOUNTER — Other Ambulatory Visit: Payer: Self-pay | Admitting: Family Medicine

## 2023-01-01 ENCOUNTER — Telehealth: Payer: Self-pay

## 2023-01-01 NOTE — Telephone Encounter (Signed)
[facility] called and wanted to do Prior authorization for Vascepa over the phone. Given all the information and awaiting response.

## 2023-01-02 ENCOUNTER — Other Ambulatory Visit: Payer: Self-pay | Admitting: Family Medicine

## 2023-01-02 DIAGNOSIS — Z1231 Encounter for screening mammogram for malignant neoplasm of breast: Secondary | ICD-10-CM

## 2023-01-07 ENCOUNTER — Other Ambulatory Visit: Payer: Self-pay

## 2023-01-07 ENCOUNTER — Ambulatory Visit
Admission: RE | Admit: 2023-01-07 | Discharge: 2023-01-07 | Disposition: A | Discharge status: Home / Self Care | Payer: PPO | Source: Ambulatory Visit | Attending: Family Medicine | Admitting: Family Medicine

## 2023-01-07 DIAGNOSIS — Z1231 Encounter for screening mammogram for malignant neoplasm of breast: Secondary | ICD-10-CM

## 2023-01-07 MED ORDER — RYBELSUS 7 MG PO TABS: 7.0000 mg | ORAL_TABLET | Freq: Every day | ORAL | 0 refills | Status: DC

## 2023-01-21 ENCOUNTER — Ambulatory Visit (INDEPENDENT_AMBULATORY_CARE_PROVIDER_SITE_OTHER): Payer: PPO | Admitting: Family Medicine

## 2023-01-21 VITALS — BP 126/74 | HR 90 | Temp 96.5°F | Ht 60.0 in | Wt 178.0 lb

## 2023-01-21 DIAGNOSIS — M1712 Unilateral primary osteoarthritis, left knee: Secondary | ICD-10-CM

## 2023-01-21 MED ORDER — DICLOFENAC SODIUM 75 MG PO TBEC: 75.0000 mg | DELAYED_RELEASE_TABLET | Freq: Two times a day (BID) | ORAL | 0 refills | Status: DC

## 2023-01-21 NOTE — Progress Notes (Signed)
Acute Office Visit  Subjective:    Patient ID: Robin Bonilla, female    DOB: 1956/02/17, 67 y.o.   MRN: [id number]  Chief Complaint  Patient presents with   Knee Pain    HPI: Patient is in today for left knee pain. States Sunday she heard it "pop" was not doing anything (walking, lifting) at times pain is throbbing, if not weight bearing knee does not hurt. Walking makes pain worse. Has not tried any pain relief medications or methods.   Past Medical History:  Diagnosis Date   Acute sinusitis    Depression    Diabetes mellitus 2005   Dysthymic disorder    Hirsutism    Hyperlipidemia    Rash, skin    Thrombocytopenia ([facility]) 09/14/2021   Thyroid disease    Upper respiratory tract infection due to COVID-19 virus 08/27/2022    Past Surgical History:  Procedure Laterality Date   BACK SURGERY     CHOLECYSTECTOMY  05/30/11   PARTIAL HYSTERECTOMY     TONSILLECTOMY     TUBAL LIGATION      Family History  Problem Relation Age of Onset   Breast cancer Mother 44   CAD Father    CAD Maternal Grandmother    Hypertension Maternal Grandmother    Diabetes Paternal Grandfather    Diabetes Paternal Aunt     Social History   Socioeconomic History   Marital status: Married    Spouse name: Not on file   Number of children: Not on file   Years of education: Not on file   Highest education level: Not on file  Occupational History   Not on file  Tobacco Use   Smoking status: Never   Smokeless tobacco: Never  Substance and Sexual Activity   Alcohol use: No    Alcohol/week: 0.0 standard drinks of alcohol   Drug use: No   Sexual activity: Not on file  Other Topics Concern   Not on file  Social History Narrative   Not on file   Social Determinants of Health   Financial Resource Strain: Low Risk  (12/02/2022)   Overall Financial Resource Strain (CARDIA)    Difficulty of Paying Living Expenses: Not hard at all  Food Insecurity: No Food Insecurity (12/02/2022)   Hunger  Vital Sign    Worried About Running Out of Food in the Last Year: Never true    Ran Out of Food in the Last Year: Never true  Transportation Needs: No Transportation Needs (12/02/2022)   PRAPARE - Administrator, Civil Service (Medical): No    Lack of Transportation (Non-Medical): No  Physical Activity: Inactive (12/02/2022)   Exercise Vital Sign    Days of Exercise per Week: 0 days    Minutes of Exercise per Session: 0 min  Stress: Stress Concern Present (12/02/2022)   [employer] of Occupational Health - Occupational Stress Questionnaire    Feeling of Stress : To some extent  Social Connections: Moderately Integrated (12/02/2022)   Social Connection and Isolation Panel [NHANES]    Frequency of Communication with Friends and Family: More than three times a week    Frequency of Social Gatherings with Friends and Family: More than three times a week    Attends Religious Services: More than 4 times per year    Active Member of [employer] or Organizations: No    Attends Banker Meetings: Never    Marital Status: Married  Catering manager Violence: Not At Risk (12/02/2022)  Humiliation, Afraid, Rape, and Kick questionnaire    Fear of Current or Ex-Partner: No    Emotionally Abused: No    Physically Abused: No    Sexually Abused: No    Outpatient Medications Prior to Visit  Medication Sig Dispense Refill   ACCU-CHEK GUIDE test strip TEST STRIPS TWICE A DAY 200 each 3   citalopram (CELEXA) 20 MG tablet TAKE 1 TABLET BY MOUTH EVERY DAY 90 tablet 1   Dapagliflozin Pro-metFORMIN ER ([name]) 01-999 MG TB24 Take 2 tablets by mouth daily. STOP METFORMIN. 180 tablet 0   fenofibrate 160 MG tablet Take 1 tablet (160 mg total) by mouth daily. 90 tablet 1   fluticasone (FLONASE) 50 MCG/ACT nasal spray SPRAY 2 SPRAYS INTO EACH NOSTRIL EVERY DAY 48 mL 1   icosapent Ethyl (VASCEPA) 1 g capsule TAKE 2 CAPSULES BY MOUTH 2 TIMES DAILY. 360 capsule 2   insulin degludec (TRESIBA  FLEXTOUCH) 200 UNIT/ML FlexTouch Pen Inject 20 Units into the skin at bedtime. 9 mL 0   Insulin Pen Needle 32G X 4 MM MISC 1 Needle by Does not apply route daily. 100 each 3   levothyroxine (SYNTHROID) 100 MCG tablet Take 1 tablet (100 mcg total) by mouth daily before breakfast. 90 tablet 3   lisinopril (ZESTRIL) 10 MG tablet Take 1 tablet (10 mg total) by mouth daily. 90 tablet 0   ondansetron (ZOFRAN) 4 MG tablet Take 1 tablet (4 mg total) by mouth every 6 (six) hours as needed for nausea. 20 tablet 0   rosuvastatin (CRESTOR) 20 MG tablet TAKE 1 TABLET BY MOUTH EVERY DAY 90 tablet 1   Semaglutide ([name]) 7 MG TABS Take 1 tablet (7 mg total) by mouth daily. 90 tablet 0   No facility-administered medications prior to visit.    Allergies  Allergen Reactions   Lunesta [Eszopiclone] Other (See Comments)    Fatigue.    Penicillins Rash   Simvastatin Other (See Comments)    nightmares     Review of Systems  Musculoskeletal:  Positive for arthralgias (left knee pain).       Objective:        01/21/2023    3:12 PM 12/02/2022    3:59 PM 10/24/2022    8:43 AM  Vitals with BMI  Height 5\' 0"  5\' 0"  5\' 0"   Weight 178 lbs 174 lbs 175 lbs  BMI 34.76 33.98 34.18  Systolic 126 110 161  Diastolic 74 60 56  Pulse 90 99 84    No data found.   Physical Exam  Health Maintenance Due  Topic Date Due   Medicare Annual Wellness (AWV)  Never done   COVID-19 Vaccine (1) Never done   DTaP/Tdap/Td (1 - Tdap) Never done   Zoster Vaccines- Shingrix (1 of 2) Never done   COLONOSCOPY (Pts 45-8yrs Insurance coverage will need to be confirmed)  05/01/2022   Pneumonia Vaccine 38+ Years old (1 of 1 - PCV) Never done   DEXA SCAN  Never done    There are no preventive care reminders to display for this patient.   Lab Results  Component Value Date   TSH 4.200 09/19/2022   Lab Results  Component Value Date   WBC 9.6 10/24/2022   HGB 14.0 10/24/2022   HCT 42.4 10/24/2022   MCV 81 10/24/2022    PLT 283 10/24/2022   Lab Results  Component Value Date   NA 138 10/24/2022   K 4.1 10/24/2022   CO2 22 10/24/2022  GLUCOSE 111 (H) 10/24/2022   BUN 18 10/24/2022   CREATININE 0.93 10/24/2022   BILITOT 0.9 10/24/2022   ALKPHOS 56 10/24/2022   AST 25 10/24/2022   ALT 22 10/24/2022   PROT 7.1 10/24/2022   ALBUMIN 4.4 10/24/2022   CALCIUM 9.7 10/24/2022   ANIONGAP 10 01/29/2022   EGFR 69 10/24/2022   GFR 70.16 11/16/2015   Lab Results  Component Value Date   CHOL 112 10/24/2022   Lab Results  Component Value Date   HDL 34 (L) 10/24/2022   Lab Results  Component Value Date   LDLCALC 55 10/24/2022   Lab Results  Component Value Date   TRIG 129 10/24/2022   Lab Results  Component Value Date   CHOLHDL 3.3 10/24/2022   Lab Results  Component Value Date   HGBA1C 9.0 (H) 10/24/2022       Assessment & Plan:  Primary osteoarthritis of left knee Assessment & Plan: Start diclofenac 75 mg twice daily.  If no improvement, may call and will do steroid injection.   Other orders -     Diclofenac Sodium; Take 1 tablet (75 mg total) by mouth 2 (two) times daily.  Dispense: 60 tablet; Refill: 0     Meds ordered this encounter  Medications   diclofenac (VOLTAREN) 75 MG EC tablet    Sig: Take 1 tablet (75 mg total) by mouth 2 (two) times daily.    Dispense:  60 tablet    Refill:  0    No orders of the defined types were placed in this encounter.    Follow-up: Return if symptoms worsen or fail to improve.  An After Visit Summary was printed and given to the patient.   Clayborn Bigness I Leal-Borjas,acting as a scribe for Blane Ohara, MD.,have documented all relevant documentation on the behalf of Blane Ohara, MD,as directed by  Blane Ohara, MD while in the presence of Blane Ohara, MD.    Blane Ohara, MD [facility] [phone]

## 2023-01-24 DIAGNOSIS — M1712 Unilateral primary osteoarthritis, left knee: Secondary | ICD-10-CM | POA: Insufficient documentation

## 2023-01-24 NOTE — Assessment & Plan Note (Addendum)
Start diclofenac 75 mg twice daily.  If no improvement, may call and will do steroid injection.

## 2023-01-25 ENCOUNTER — Encounter: Payer: Self-pay | Admitting: Family Medicine

## 2023-01-28 NOTE — Assessment & Plan Note (Signed)
Well controlled.  No changes to medicines.  lisinopril 10 mg daily.  Continue to work on eating a healthy diet and exercise.  Labs drawn today.

## 2023-01-28 NOTE — Assessment & Plan Note (Signed)
Control: improved Recommend check sugars fasting daily. Recommend check feet daily. Recommend annual eye exams. Medicines: Increase [name] 14 mg once daily, xigduo xr 01/999 mg 2 daily, continue tresiba 20 U before bed. .  Continue to work on eating a healthy diet and exercise.  Labs drawn today.

## 2023-01-28 NOTE — Assessment & Plan Note (Signed)
Previously well controlled Continue Synthroid at current dose  Recheck TSH and adjust Synthroid as indicated   

## 2023-01-28 NOTE — Progress Notes (Signed)
Subjective:  Patient ID: Robin Bonilla, female    DOB: [birth year]  Age: 67 y.o. MRN: [id number]  Chief Complaint  Patient presents with   Hypothyroidism   Hyperlipidemia    HPI Diabetes:  Sugars 174-200 checking twice daily.  Medications:: On rybelsus 7 mg once daily and xigduo xr 01/999 mg 2 daily. On tresiba 20 units before bed.  Eye exam: due   Checking feet daily.  Healthy Diet. No exercising.   Depression: on citalopram 20 mg once daily.    Hyperlipidemia: : Patient is currently on Vascepa 1 g 2 capsules twice daily, fenofibrate 160 mg once daily and rosuvastatin 20 mg nightly.   Hypertension: On lisinopril 10 mg daily.  This is also for nephro protection.     01/29/2023    7:50 AM 10/24/2022    8:48 AM 01/14/2022   11:23 AM 12/25/2021    9:04 AM 12/09/2021    8:52 AM  Depression screen PHQ 2/9  Decreased Interest 0 0 1 1 3   Down, Depressed, Hopeless 0 2 1 1 3   PHQ - 2 Score 0 2 2 2 6   Altered sleeping  2 2 1 2   Tired, decreased energy  3 1 1 3   Change in appetite  0 1 1 2   Feeling bad or failure about yourself   3 2 2 2   Trouble concentrating  2 1 2 3   Moving slowly or fidgety/restless  2 1 3 3   Suicidal thoughts  1 0 0 1  PHQ-9 Score  15 10 12 22   Difficult doing work/chores  Very difficult Somewhat difficult Very difficult Very difficult        01/29/2023    7:49 AM  Fall Risk   Falls in the past year? 0  Number falls in past yr: 0  Risk for fall due to : No Fall Risks  Follow up Falls evaluation completed;Falls prevention discussed    Patient Care Team: Analynn Daum, Fritzi Mandes, MD as PCP - General (Family Medicine)   Review of Systems  Constitutional:  Negative for chills, fatigue and fever.  HENT:  Negative for congestion, rhinorrhea and sore throat.   Respiratory:  Negative for cough and shortness of breath.   Cardiovascular:  Negative for chest pain.  Gastrointestinal:  Positive for nausea. Negative for abdominal pain, constipation, diarrhea and  vomiting.  Genitourinary:  Negative for dysuria and urgency.  Musculoskeletal:  Positive for arthralgias (left knee pain improving). Negative for back pain and myalgias.  Neurological:  Positive for dizziness. Negative for weakness, light-headedness and headaches.  Psychiatric/Behavioral:  Negative for dysphoric mood. The patient is not nervous/anxious.     Current Outpatient Medications on File Prior to Visit  Medication Sig Dispense Refill   ACCU-CHEK GUIDE test strip TEST STRIPS TWICE A DAY 200 each 3   citalopram (CELEXA) 20 MG tablet TAKE 1 TABLET BY MOUTH EVERY DAY 90 tablet 1   Dapagliflozin Pro-metFORMIN ER ([name]) 01-999 MG TB24 Take 2 tablets by mouth daily. STOP METFORMIN. 180 tablet 0   diclofenac (VOLTAREN) 75 MG EC tablet Take 1 tablet (75 mg total) by mouth 2 (two) times daily. 60 tablet 0   fenofibrate 160 MG tablet Take 1 tablet (160 mg total) by mouth daily. 90 tablet 1   fluticasone (FLONASE) 50 MCG/ACT nasal spray SPRAY 2 SPRAYS INTO EACH NOSTRIL EVERY DAY 48 mL 1   icosapent Ethyl (VASCEPA) 1 g capsule TAKE 2 CAPSULES BY MOUTH 2 TIMES DAILY. 360 capsule 2  insulin degludec (TRESIBA FLEXTOUCH) 200 UNIT/ML FlexTouch Pen Inject 20 Units into the skin at bedtime. 9 mL 0   Insulin Pen Needle 32G X 4 MM MISC 1 Needle by Does not apply route daily. 100 each 3   levothyroxine (SYNTHROID) 100 MCG tablet Take 1 tablet (100 mcg total) by mouth daily before breakfast. 90 tablet 3   lisinopril (ZESTRIL) 10 MG tablet Take 1 tablet (10 mg total) by mouth daily. 90 tablet 0   ondansetron (ZOFRAN) 4 MG tablet Take 1 tablet (4 mg total) by mouth every 6 (six) hours as needed for nausea. 20 tablet 0   rosuvastatin (CRESTOR) 20 MG tablet TAKE 1 TABLET BY MOUTH EVERY DAY 90 tablet 1   No current facility-administered medications on file prior to visit.   Past Medical History:  Diagnosis Date   Acute sinusitis    Depression    Diabetes mellitus 2005   Dysthymic disorder     Hirsutism    Hyperlipidemia    Rash, skin    Thrombocytopenia (HCC) 09/14/2021   Thyroid disease    Upper respiratory tract infection due to COVID-19 virus 08/27/2022   Past Surgical History:  Procedure Laterality Date   BACK SURGERY     CHOLECYSTECTOMY  05/30/11   PARTIAL HYSTERECTOMY     TONSILLECTOMY     TUBAL LIGATION      Family History  Problem Relation Age of Onset   Breast cancer Mother 19   CAD Father    CAD Maternal Grandmother    Hypertension Maternal Grandmother    Diabetes Paternal Grandfather    Diabetes Paternal Aunt    Social History   Socioeconomic History   Marital status: Married    Spouse name: Not on file   Number of children: Not on file   Years of education: Not on file   Highest education level: Not on file  Occupational History   Not on file  Tobacco Use   Smoking status: Never   Smokeless tobacco: Never  Substance and Sexual Activity   Alcohol use: No    Alcohol/week: 0.0 standard drinks of alcohol   Drug use: No   Sexual activity: Not on file  Other Topics Concern   Not on file  Social History Narrative   Not on file   Social Determinants of Health   Financial Resource Strain: Low Risk  (12/02/2022)   Overall Financial Resource Strain (CARDIA)    Difficulty of Paying Living Expenses: Not hard at all  Food Insecurity: No Food Insecurity (12/02/2022)   Hunger Vital Sign    Worried About Running Out of Food in the Last Year: Never true    Ran Out of Food in the Last Year: Never true  Transportation Needs: No Transportation Needs (12/02/2022)   PRAPARE - Administrator, Civil Service (Medical): No    Lack of Transportation (Non-Medical): No  Physical Activity: Inactive (12/02/2022)   Exercise Vital Sign    Days of Exercise per Week: 0 days    Minutes of Exercise per Session: 0 min  Stress: Stress Concern Present (12/02/2022)   [employer] of Occupational Health - Occupational Stress Questionnaire    Feeling of Stress : To  some extent  Social Connections: Moderately Integrated (12/02/2022)   Social Connection and Isolation Panel [NHANES]    Frequency of Communication with Friends and Family: More than three times a week    Frequency of Social Gatherings with Friends and Family: More than three times a  week    Attends Religious Services: More than 4 times per year    Active Member of Clubs or Organizations: No    Attends Banker Meetings: Never    Marital Status: Married    Objective:  BP 120/68   Pulse 88   Temp (!) 97.2 F (36.2 C)   Ht 5' (1.524 m)   Wt 178 lb (80.7 kg)   SpO2 96%   BMI 34.76 kg/m      01/29/2023    7:42 AM 01/21/2023    3:12 PM 12/02/2022    3:59 PM  BP/Weight  Systolic BP 120 126 110  Diastolic BP 68 74 60  Wt. (Lbs) 178 178 174  BMI 34.76 kg/m2 34.76 kg/m2 33.98 kg/m2    Physical Exam Vitals reviewed.  Constitutional:      Appearance: Normal appearance. She is normal weight.  HENT:     Nose:     Right Turbinates: Swollen.     Left Turbinates: Swollen.  Eyes:     Extraocular Movements: Extraocular movements intact.     Comments: No nystagmus   Neck:     Vascular: No carotid bruit.  Cardiovascular:     Rate and Rhythm: Normal rate and regular rhythm.     Heart sounds: Normal heart sounds.  Pulmonary:     Effort: Pulmonary effort is normal. No respiratory distress.     Breath sounds: Normal breath sounds.  Abdominal:     General: Abdomen is flat. Bowel sounds are normal.     Palpations: Abdomen is soft.     Tenderness: There is no abdominal tenderness.  Neurological:     Mental Status: She is alert and oriented to person, place, and time.  Psychiatric:        Mood and Affect: Mood normal.        Behavior: Behavior normal.     Diabetic Foot Exam - Simple   Simple Foot Form  01/29/2023  9:41 PM  Visual Inspection No deformities, no ulcerations, no other skin breakdown bilaterally: Yes Sensation Testing Intact to touch and monofilament testing  bilaterally: Yes Pulse Check Posterior Tibialis and Dorsalis pulse intact bilaterally: Yes Comments      Lab Results  Component Value Date   WBC 8.7 01/29/2023   HGB 14.3 01/29/2023   HCT 44.4 01/29/2023   PLT 224 01/29/2023   GLUCOSE 171 (H) 01/29/2023   CHOL 110 01/29/2023   TRIG 177 (H) 01/29/2023   HDL 28 (L) 01/29/2023   LDLCALC 52 01/29/2023   ALT 34 (H) 01/29/2023   AST 34 01/29/2023   NA 138 01/29/2023   K 4.4 01/29/2023   CL 101 01/29/2023   CREATININE 0.90 01/29/2023   BUN 14 01/29/2023   CO2 18 (L) 01/29/2023   TSH 3.930 01/29/2023   HGBA1C 9.6 (H) 01/29/2023   MICROALBUR 80 09/26/2021      Assessment & Plan:    Hypertension associated with diabetes (HCC) Assessment & Plan: Well controlled.  No changes to medicines.  lisinopril 10 mg daily.  Continue to work on eating a healthy diet and exercise.  Labs drawn today.    Orders: -     Comprehensive metabolic panel -     CBC with Differential/Platelet  Hypothyroidism (acquired) Assessment & Plan: Previously well controlled Continue Synthroid at current dose  Recheck TSH and adjust Synthroid as indicated    Orders: -     TSH  Diabetic glomerulopathy (HCC) Assessment & Plan: Control: improved Recommend  check sugars fasting daily. Recommend check feet daily. Recommend annual eye exams. Medicines: Increase [name] 14 mg once daily, xigduo xr 01/999 mg 2 daily, continue tresiba 20 U before bed. .  Continue to work on eating a healthy diet and exercise.  Labs drawn today.     Orders: -     Hemoglobin A1c -     Microalbumin / creatinine urine ratio -     [name]; Take 1 tablet (14 mg total) by mouth daily.  Dispense: 30 tablet; Refill: 2 -     Cardiovascular Risk Assessment  Mixed hyperlipidemia Assessment & Plan: Well controlled.  No changes to medicines. Vascepa 1 g 2 capsules twice daily, fenofibrate 160 mg once daily and rosuvastatin 20 mg nightly.  Continue to work on eating a healthy  diet and exercise.  Labs drawn today.    Orders: -     Lipid panel  Class 1 obesity due to excess calories with serious comorbidity and body mass index (BMI) of 34.0 to 34.9 in adult Assessment & Plan: Recommend continue to work on eating healthy diet and exercise.    Mild recurrent major depression (HCC) Assessment & Plan: The current medical regimen is effective;  continue present plan and medications. Continue citalopram 20 mg daily.       Meds ordered this encounter  Medications   Semaglutide ([name]) 14 MG TABS    Sig: Take 1 tablet (14 mg total) by mouth daily.    Dispense:  30 tablet    Refill:  2    Orders Placed This Encounter  Procedures   Comprehensive metabolic panel   Hemoglobin A1c   Lipid panel   CBC with Differential/Platelet   Microalbumin / creatinine urine ratio   TSH   Cardiovascular Risk Assessment     Follow-up: Return in about 3 months (around 05/01/2023) for fasting.   Clayborn Bigness I Leal-Borjas,acting as a scribe for Blane Ohara, MD.,have documented all relevant documentation on the behalf of Blane Ohara, MD,as directed by  Blane Ohara, MD while in the presence of Blane Ohara, MD.   An After Visit Summary was printed and given to the patient  I attest that I have reviewed this visit and agree with the plan scribed by my staff.   Blane Ohara, MD [facility] [phone]

## 2023-01-28 NOTE — Assessment & Plan Note (Signed)
Well controlled.  No changes to medicines. Vascepa 1 g 2 capsules twice daily, fenofibrate 160 mg once daily and rosuvastatin 20 mg nightly.  Continue to work on eating a healthy diet and exercise.  Labs drawn today.   

## 2023-01-29 ENCOUNTER — Ambulatory Visit (INDEPENDENT_AMBULATORY_CARE_PROVIDER_SITE_OTHER): Payer: PPO | Admitting: Family Medicine

## 2023-01-29 ENCOUNTER — Encounter: Payer: Self-pay | Admitting: Family Medicine

## 2023-01-29 VITALS — BP 120/68 | HR 88 | Temp 97.2°F | Ht 60.0 in | Wt 178.0 lb

## 2023-01-29 DIAGNOSIS — E1121 Type 2 diabetes mellitus with diabetic nephropathy: Secondary | ICD-10-CM | POA: Diagnosis not present

## 2023-01-29 DIAGNOSIS — F330 Major depressive disorder, recurrent, mild: Secondary | ICD-10-CM

## 2023-01-29 DIAGNOSIS — E1159 Type 2 diabetes mellitus with other circulatory complications: Secondary | ICD-10-CM | POA: Diagnosis not present

## 2023-01-29 DIAGNOSIS — Z7984 Long term (current) use of oral hypoglycemic drugs: Secondary | ICD-10-CM

## 2023-01-29 DIAGNOSIS — E039 Hypothyroidism, unspecified: Secondary | ICD-10-CM

## 2023-01-29 DIAGNOSIS — E6609 Other obesity due to excess calories: Secondary | ICD-10-CM | POA: Diagnosis not present

## 2023-01-29 DIAGNOSIS — I152 Hypertension secondary to endocrine disorders: Secondary | ICD-10-CM | POA: Diagnosis not present

## 2023-01-29 DIAGNOSIS — E782 Mixed hyperlipidemia: Secondary | ICD-10-CM | POA: Diagnosis not present

## 2023-01-29 DIAGNOSIS — Z794 Long term (current) use of insulin: Secondary | ICD-10-CM

## 2023-01-29 DIAGNOSIS — Z6834 Body mass index (BMI) 34.0-34.9, adult: Secondary | ICD-10-CM

## 2023-01-29 MED ORDER — RYBELSUS 14 MG PO TABS
14.0000 mg | ORAL_TABLET | Freq: Every day | ORAL | 2 refills | Status: DC
Start: 2023-01-29 — End: 2023-05-06

## 2023-01-29 NOTE — Assessment & Plan Note (Signed)
The current medical regimen is effective;  continue present plan and medications. Continue citalopram 20 mg daily.  

## 2023-01-29 NOTE — Assessment & Plan Note (Signed)
Recommend continue to work on eating healthy diet and exercise.  

## 2023-01-30 LAB — CBC WITH DIFFERENTIAL/PLATELET
Basophils Absolute: 0.1 10*3/uL (ref 0.0–0.2)
Basos: 1 %
EOS (ABSOLUTE): 0.5 10*3/uL — ABNORMAL HIGH (ref 0.0–0.4)
Eos: 6 %
Hematocrit: 44.4 % (ref 34.0–46.6)
Hemoglobin: 14.3 g/dL (ref 11.1–15.9)
Immature Grans (Abs): 0 10*3/uL (ref 0.0–0.1)
Immature Granulocytes: 0 %
Lymphocytes Absolute: 3.3 10*3/uL — ABNORMAL HIGH (ref 0.7–3.1)
Lymphs: 38 %
MCH: 26.2 pg — ABNORMAL LOW (ref 26.6–33.0)
MCHC: 32.2 g/dL (ref 31.5–35.7)
MCV: 81 fL (ref 79–97)
Monocytes Absolute: 0.5 10*3/uL (ref 0.1–0.9)
Monocytes: 6 %
Neutrophils Absolute: 4.3 10*3/uL (ref 1.4–7.0)
Neutrophils: 49 %
Platelets: 224 10*3/uL (ref 150–450)
RBC: 5.46 x10E6/uL — ABNORMAL HIGH (ref 3.77–5.28)
RDW: 13.6 % (ref 11.7–15.4)
WBC: 8.7 10*3/uL (ref 3.4–10.8)

## 2023-01-30 LAB — CARDIOVASCULAR RISK ASSESSMENT

## 2023-01-30 LAB — LIPID PANEL
Chol/HDL Ratio: 3.9 ratio (ref 0.0–4.4)
Cholesterol, Total: 110 mg/dL (ref 100–199)
HDL: 28 mg/dL — ABNORMAL LOW (ref >39)
LDL Chol Calc (NIH): 52 mg/dL (ref 0–99)
Triglycerides: 177 mg/dL — ABNORMAL HIGH (ref 0–149)
VLDL Cholesterol Cal: 30 mg/dL (ref 5–40)

## 2023-01-30 LAB — COMPREHENSIVE METABOLIC PANEL
ALT: 34 IU/L — ABNORMAL HIGH (ref 0–32)
AST: 34 IU/L (ref 0–40)
Albumin/Globulin Ratio: 1.5 (ref 1.2–2.2)
Albumin: 4.5 g/dL (ref 3.9–4.9)
Alkaline Phosphatase: 62 IU/L (ref 44–121)
BUN/Creatinine Ratio: 16 (ref 12–28)
BUN: 14 mg/dL (ref 8–27)
Bilirubin Total: 1 mg/dL (ref 0.0–1.2)
CO2: 18 mmol/L — ABNORMAL LOW (ref 20–29)
Calcium: 10 mg/dL (ref 8.7–10.3)
Chloride: 101 mmol/L (ref 96–106)
Creatinine, Ser: 0.9 mg/dL (ref 0.57–1.00)
Globulin, Total: 3 g/dL (ref 1.5–4.5)
Glucose: 171 mg/dL — ABNORMAL HIGH (ref 70–99)
Potassium: 4.4 mmol/L (ref 3.5–5.2)
Sodium: 138 mmol/L (ref 134–144)
Total Protein: 7.5 g/dL (ref 6.0–8.5)
eGFR: 71 mL/min/{1.73_m2} (ref >59)

## 2023-01-30 LAB — TSH: TSH: 3.93 u[IU]/mL (ref 0.450–4.500)

## 2023-01-30 LAB — MICROALBUMIN / CREATININE URINE RATIO
Creatinine, Urine: 71 mg/dL
Microalb/Creat Ratio: 32 mg/g creat — ABNORMAL HIGH (ref 0–29)
Microalbumin, Urine: 22.7 ug/mL

## 2023-01-30 LAB — HEMOGLOBIN A1C
Est. average glucose Bld gHb Est-mCnc: 229 mg/dL
Hgb A1c MFr Bld: 9.6 % — ABNORMAL HIGH (ref 4.8–5.6)

## 2023-02-01 ENCOUNTER — Encounter: Payer: Self-pay | Admitting: Family Medicine

## 2023-02-10 ENCOUNTER — Ambulatory Visit: Payer: PPO

## 2023-02-10 LAB — HM DIABETES EYE EXAM

## 2023-02-16 ENCOUNTER — Telehealth: Payer: Self-pay

## 2023-02-16 NOTE — Telephone Encounter (Signed)
LM with normal results from retinopathy screening

## 2023-02-19 ENCOUNTER — Other Ambulatory Visit: Payer: Self-pay | Admitting: Family Medicine

## 2023-02-19 DIAGNOSIS — E1121 Type 2 diabetes mellitus with diabetic nephropathy: Secondary | ICD-10-CM

## 2023-02-19 DIAGNOSIS — I10 Essential (primary) hypertension: Secondary | ICD-10-CM

## 2023-02-20 ENCOUNTER — Telehealth: Payer: Self-pay

## 2023-02-20 ENCOUNTER — Other Ambulatory Visit: Payer: Self-pay

## 2023-02-20 ENCOUNTER — Other Ambulatory Visit: Payer: Self-pay | Admitting: Physician Assistant

## 2023-02-20 DIAGNOSIS — E1121 Type 2 diabetes mellitus with diabetic nephropathy: Secondary | ICD-10-CM

## 2023-02-20 DIAGNOSIS — I10 Essential (primary) hypertension: Secondary | ICD-10-CM

## 2023-02-20 NOTE — Telephone Encounter (Signed)
Patient complaining of nausea and diarrhea for the last week.  She reports no fever or chills.  No change in blood sugars.  Dr. Sedalia Muta advised follow-up in the ED.

## 2023-03-02 ENCOUNTER — Emergency Department (HOSPITAL_BASED_OUTPATIENT_CLINIC_OR_DEPARTMENT_OTHER): Payer: PPO

## 2023-03-02 ENCOUNTER — Emergency Department (HOSPITAL_BASED_OUTPATIENT_CLINIC_OR_DEPARTMENT_OTHER)
Admission: EM | Admit: 2023-03-02 | Discharge: 2023-03-02 | Disposition: A | Discharge status: Home / Self Care | Payer: PPO | Attending: Emergency Medicine | Admitting: Emergency Medicine

## 2023-03-02 ENCOUNTER — Encounter (HOSPITAL_BASED_OUTPATIENT_CLINIC_OR_DEPARTMENT_OTHER): Payer: Self-pay | Admitting: Radiology

## 2023-03-02 ENCOUNTER — Other Ambulatory Visit: Payer: Self-pay

## 2023-03-02 DIAGNOSIS — K5730 Diverticulosis of large intestine without perforation or abscess without bleeding: Secondary | ICD-10-CM | POA: Diagnosis not present

## 2023-03-02 DIAGNOSIS — R112 Nausea with vomiting, unspecified: Secondary | ICD-10-CM

## 2023-03-02 DIAGNOSIS — Z794 Long term (current) use of insulin: Secondary | ICD-10-CM | POA: Insufficient documentation

## 2023-03-02 DIAGNOSIS — R109 Unspecified abdominal pain: Secondary | ICD-10-CM | POA: Diagnosis not present

## 2023-03-02 DIAGNOSIS — D72829 Elevated white blood cell count, unspecified: Secondary | ICD-10-CM | POA: Insufficient documentation

## 2023-03-02 DIAGNOSIS — E119 Type 2 diabetes mellitus without complications: Secondary | ICD-10-CM | POA: Diagnosis not present

## 2023-03-02 DIAGNOSIS — R197 Diarrhea, unspecified: Secondary | ICD-10-CM | POA: Diagnosis not present

## 2023-03-02 DIAGNOSIS — K5792 Diverticulitis of intestine, part unspecified, without perforation or abscess without bleeding: Secondary | ICD-10-CM | POA: Diagnosis not present

## 2023-03-02 LAB — COMPREHENSIVE METABOLIC PANEL
ALT: 25 U/L (ref 0–44)
AST: 35 U/L (ref 15–41)
Albumin: 3.9 g/dL (ref 3.5–5.0)
Alkaline Phosphatase: 48 U/L (ref 38–126)
Anion gap: 14 (ref 5–15)
BUN: 19 mg/dL (ref 8–23)
CO2: 21 mmol/L — ABNORMAL LOW (ref 22–32)
Calcium: 9.4 mg/dL (ref 8.9–10.3)
Chloride: 101 mmol/L (ref 98–111)
Creatinine, Ser: 0.98 mg/dL (ref 0.44–1.00)
GFR, Estimated: >60 mL/min (ref >60)
Glucose, Bld: 239 mg/dL — ABNORMAL HIGH (ref 70–99)
Potassium: 4.2 mmol/L (ref 3.5–5.1)
Sodium: 136 mmol/L (ref 135–145)
Total Bilirubin: 1.1 mg/dL (ref 0.3–1.2)
Total Protein: 7.5 g/dL (ref 6.5–8.1)

## 2023-03-02 LAB — CBC
HCT: 44.2 % (ref 36.0–46.0)
Hemoglobin: 14.4 g/dL (ref 12.0–15.0)
MCH: 26 pg (ref 26.0–34.0)
MCHC: 32.6 g/dL (ref 30.0–36.0)
MCV: 79.8 fL — ABNORMAL LOW (ref 80.0–100.0)
Platelets: 242 10*3/uL (ref 150–400)
RBC: 5.54 MIL/uL — ABNORMAL HIGH (ref 3.87–5.11)
RDW: 14.2 % (ref 11.5–15.5)
WBC: 12.5 10*3/uL — ABNORMAL HIGH (ref 4.0–10.5)
nRBC: 0 % (ref 0.0–0.2)

## 2023-03-02 LAB — LIPASE, BLOOD: Lipase: 26 U/L (ref 11–51)

## 2023-03-02 MED ORDER — IOHEXOL 300 MG/ML  SOLN
100.0000 mL | Freq: Once | INTRAMUSCULAR | Status: AC | PRN
  Administered 2023-03-02: 100 mL via INTRAVENOUS

## 2023-03-02 MED ORDER — LIDOCAINE VISCOUS HCL 2 % MT SOLN
15.0000 mL | Freq: Once | OROMUCOSAL | Status: AC
  Administered 2023-03-02: 15 mL via ORAL
  Filled 2023-03-02: qty 15

## 2023-03-02 MED ORDER — LACTATED RINGERS IV BOLUS
1000.0000 mL | Freq: Once | INTRAVENOUS | Status: AC
  Administered 2023-03-02: 1000 mL via INTRAVENOUS

## 2023-03-02 MED ORDER — ONDANSETRON HCL 4 MG/2ML IJ SOLN
4.0000 mg | Freq: Once | INTRAMUSCULAR | Status: AC
  Administered 2023-03-02: 4 mg via INTRAVENOUS
  Filled 2023-03-02: qty 2

## 2023-03-02 MED ORDER — CIPROFLOXACIN HCL 500 MG PO TABS: 500.0000 mg | ORAL_TABLET | Freq: Two times a day (BID) | ORAL | 0 refills | Status: DC

## 2023-03-02 MED ORDER — METRONIDAZOLE 500 MG PO TABS: 500.0000 mg | ORAL_TABLET | Freq: Three times a day (TID) | ORAL | 0 refills | Status: DC

## 2023-03-02 MED ORDER — ALUM & MAG HYDROXIDE-SIMETH 200-200-20 MG/5ML PO SUSP
30.0000 mL | Freq: Once | ORAL | Status: AC
  Administered 2023-03-02: 30 mL via ORAL
  Filled 2023-03-02: qty 30

## 2023-03-02 MED ORDER — ONDANSETRON HCL 4 MG PO TABS: 4.0000 mg | ORAL_TABLET | Freq: Four times a day (QID) | ORAL | 0 refills | Status: DC | PRN

## 2023-03-02 NOTE — Discharge Instructions (Addendum)
Please take both antibiotics that I have prescribed, the metronidazole (Flagyl) is 3 times daily, whereas the other antibiotic Cipro is only twice daily.  You can use the nausea medication I prescribed as needed to help keep food and fluids on your stomach.  I would stick to a liquid diet for at least a week, you can slowly transition back to your normal diet as tolerated.  If you have ongoing pain, diarrhea, vomiting you may want to stick with a liquid diet until the symptoms improve.  Please follow-up with the GI doctor to ensure resolution of symptoms, or if symptoms do not completely improve despite treatment.

## 2023-03-02 NOTE — ED Provider Notes (Signed)
[place] [facility] Provider Note   CSN: [id number] Arrival date & time: 03/02/23  1100     History  Chief Complaint  Patient presents with   Abdominal Pain    Robin Bonilla is a 67 y.o. female with past medical history significant for hyperlipidemia, diabetes, obesity, interstitial lung disease who presents with concern for abdominal pain, nausea, vomiting, diarrhea worse since last night.  Patient reports she has been having the symptoms intermittently for a month.  Patient reports decreased appetite, chills.  She denies any hematemesis, hematochezia.  She denies any dysuria.  Previous history of cholecystectomy, hysterectomy.  Patient denies any recent change in her diet.  Patient reports that she did have something to eat this morning and has been able to keep it down but left a bad taste in her mouth and she continues to feel nauseous.  She rates her pain a 5/10 at this time.  Reports that it is sharp, diffuse.   Abdominal Pain      Home Medications Prior to Admission medications   Medication Sig Start Date End Date Taking? Authorizing Provider  ciprofloxacin (CIPRO) 500 MG tablet Take 1 tablet (500 mg total) by mouth every 12 (twelve) hours. 03/02/23  Yes Jadan Rouillard H, PA-C  metroNIDAZOLE (FLAGYL) 500 MG tablet Take 1 tablet (500 mg total) by mouth 3 (three) times daily. 03/02/23  Yes Rashell Shambaugh H, PA-C  ondansetron (ZOFRAN) 4 MG tablet Take 1 tablet (4 mg total) by mouth every 6 (six) hours as needed for nausea or vomiting. 03/02/23  Yes Kylee Umana H, PA-C  ACCU-CHEK GUIDE test strip TEST STRIPS TWICE A DAY 10/24/22   Cox, Kirsten, MD  citalopram (CELEXA) 20 MG tablet TAKE 1 TABLET BY MOUTH EVERY DAY 11/28/22   Cox, Fritzi Mandes, MD  Dapagliflozin Pro-metFORMIN ER ([name]) 01-999 MG TB24 Take 2 tablets by mouth daily. STOP METFORMIN. 10/21/22   CoxFritzi Mandes, MD  diclofenac (VOLTAREN) 75 MG EC tablet Take 1 tablet (75  mg total) by mouth 2 (two) times daily. 01/21/23   CoxFritzi Mandes, MD  fenofibrate 160 MG tablet Take 1 tablet (160 mg total) by mouth daily. 10/13/22   Cox, Fritzi Mandes, MD  fluticasone [facility]) 50 MCG/ACT nasal spray SPRAY 2 SPRAYS INTO EACH NOSTRIL EVERY DAY 12/24/22   Cox, Kirsten, MD  icosapent Ethyl (VASCEPA) 1 g capsule TAKE 2 CAPSULES BY MOUTH 2 TIMES DAILY. 05/29/22   Cox, Kirsten, MD  insulin degludec (TRESIBA FLEXTOUCH) 200 UNIT/ML FlexTouch Pen Inject 20 Units into the skin at bedtime. 12/02/22   Cox, Fritzi Mandes, MD  Insulin Pen Needle 32G X 4 MM MISC 1 Needle by Does not apply route daily. 07/17/22   Blane Ohara, MD  levothyroxine (SYNTHROID) 100 MCG tablet Take 1 tablet (100 mcg total) by mouth daily before breakfast. 09/20/22   Cox, Kirsten, MD  lisinopril (ZESTRIL) 10 MG tablet TAKE 1 TABLET BY MOUTH EVERY DAY 02/19/23   Cox, Kirsten, MD  rosuvastatin (CRESTOR) 20 MG tablet TAKE 1 TABLET BY MOUTH EVERY DAY 09/16/22   Cox, Kirsten, MD  Semaglutide ([name]) 14 MG TABS Take 1 tablet (14 mg total) by mouth daily. 01/29/23   Blane Ohara, MD      Allergies    Lunesta [eszopiclone], Penicillins, and Simvastatin    Review of Systems   Review of Systems  Gastrointestinal:  Positive for abdominal pain.  All other systems reviewed and are negative.   Physical Exam Updated Vital Signs BP 135/61  Pulse 85   Temp 98.6 F (37 C) (Oral)   Resp 16   Ht 5' (1.524 m)   Wt 77.1 kg   SpO2 94%   BMI 33.20 kg/m  Physical Exam Vitals and nursing note reviewed.  Constitutional:      General: She is not in acute distress.    Appearance: Normal appearance.  HENT:     Head: Normocephalic and atraumatic.  Eyes:     General:        Right eye: No discharge.        Left eye: No discharge.  Cardiovascular:     Rate and Rhythm: Normal rate and regular rhythm.     Heart sounds: No murmur heard.    No friction rub. No gallop.  Pulmonary:     Effort: Pulmonary effort is normal.     Breath sounds:  Normal breath sounds.  Abdominal:     General: Bowel sounds are normal.     Palpations: Abdomen is soft.     Comments: Patient with some tenderness to palpation, most focally in the left upper quadrant, no rebound, rigidity, guarding.  No significant abdominal distention.  Normal bowel sounds throughout.  Skin:    General: Skin is warm and dry.     Capillary Refill: Capillary refill takes less than 2 seconds.  Neurological:     Mental Status: She is alert and oriented to person, place, and time.  Psychiatric:        Mood and Affect: Mood normal.        Behavior: Behavior normal.     ED Results / Procedures / Treatments   Labs (all labs ordered are listed, but only abnormal results are displayed) Labs Reviewed  COMPREHENSIVE METABOLIC PANEL - Abnormal; Notable for the following components:      Result Value   CO2 21 (*)    Glucose, Bld 239 (*)    All other components within normal limits  CBC - Abnormal; Notable for the following components:   WBC 12.5 (*)    RBC 5.54 (*)    MCV 79.8 (*)    All other components within normal limits  LIPASE, BLOOD  URINALYSIS, ROUTINE W REFLEX MICROSCOPIC    EKG None  Radiology CT ABDOMEN PELVIS W CONTRAST  Result Date: 03/02/2023 CLINICAL DATA:  Abdominal pain EXAM: CT ABDOMEN AND PELVIS WITH CONTRAST TECHNIQUE: Multidetector CT imaging of the abdomen and pelvis was performed using the standard protocol following bolus administration of intravenous contrast. RADIATION DOSE REDUCTION: This exam was performed according to the departmental dose-optimization program which includes automated exposure control, adjustment of the mA and/or kV according to patient size and/or use of iterative reconstruction technique. CONTRAST:  OMNIPAQUE IOHEXOL 300 MG/ML  SOLN COMPARISON:  02/12/2022 FINDINGS: Lower chest: Visualized lower lung fields are clear. Hepatobiliary: There is 7 mm low-density in the inferior aspect of right lobe of liver with no  interval change, possibly a cyst. Liver measures 22.5 cm in length. There is no dilation of bile ducts. Surgical clips are seen in gallbladder fossa. Pancreas: No focal abnormalities are seen. Spleen: Unremarkable. Adrenals/Urinary Tract: Adrenals are unremarkable. There is no hydronephrosis. There are no renal or ureteral stones. Urinary bladder is unremarkable. Stomach/Bowel: Stomach is unremarkable. Small bowel loops are not dilated. Appendix is not dilated. There is no significant wall thickening in colon. Scattered diverticula are seen in colon. There is mild stranding in the fat planes along the posterior margin of distal descending colon at the  level of left iliac crest. There is no loculated pericolic fluid collection. Vascular/Lymphatic: Vascular structures are unremarkable. There are slightly enlarged lymph nodes in retroperitoneum, mesentery and adjacent to porta hepatis with no significant interval change suggesting possible benign reactive hyperplasia. Reproductive: Uterus is not seen.  There are no adnexal masses. Other: There is no ascites or pneumoperitoneum. Musculoskeletal: Degenerative changes are noted in thoracic and lumbar spine. Degenerative changes are noted in both hips. IMPRESSION: There is no evidence of intestinal obstruction or pneumoperitoneum. Appendix is not dilated. There is no hydronephrosis. Diverticulosis of colon. There is mild stranding in pericolic fat adjacent to the posterior margin of descending colon at the level of left iliac crest suggesting possible mild acute or chronic diverticulitis. There is no loculated pericolic abscess. Enlarged fatty liver. There is 7 mm low-density in the inferior right lobe of liver with no interval change, possibly a cyst. Electronically Signed   By: Ernie Avena M.D.   On: 03/02/2023 13:04    Procedures Procedures    Medications Ordered in ED Medications  alum & mag hydroxide-simeth (MAALOX/MYLANTA) 200-200-20 MG/5ML suspension  30 mL (30 mLs Oral Given 03/02/23 1150)    And  lidocaine (XYLOCAINE) 2 % viscous mouth solution 15 mL (15 mLs Oral Given 03/02/23 1150)  lactated ringers bolus 1,000 mL ( Intravenous Stopped 03/02/23 1306)  ondansetron (ZOFRAN) injection 4 mg (4 mg Intravenous Given 03/02/23 1144)  iohexol (OMNIPAQUE) 300 MG/ML solution 100 mL (100 mLs Intravenous Contrast Given 03/02/23 1230)    ED Course/ Medical Decision Making/ A&P                             Medical Decision Making Amount and/or Complexity of Data Reviewed Labs: ordered. Radiology: ordered.  Risk OTC drugs. Prescription drug management.   This patient is a 67 y.o. female  who presents to the ED for concern of abdominal pain, nausea, vomiting, diarrhea.   Differential diagnoses prior to evaluation: The emergent differential diagnosis includes, but is not limited to,  The causes of generalized abdominal pain include but are not limited to AAA, mesenteric ischemia, appendicitis, diverticulitis, DKA, gastritis, gastroenteritis, AMI, nephrolithiasis, pancreatitis, peritonitis, adrenal insufficiency,lead poisoning, iron toxicity, intestinal ischemia, constipation, UTI,SBO/LBO, splenic rupture, biliary disease, IBD, IBS, PUD, or hepatitis. This is not an exhaustive differential.   Past Medical History / Co-morbidities / Social History: hyperlipidemia, diabetes, obesity, interstitial lung disease, previous cholecystectomy, previous hysterectomy, pancreatic cyst  Additional history: Chart reviewed. Pertinent results include: Reviewed outpatient family medicine visits, lab work, imaging from previous emergency department visits, patient with no recent GI visits on file but she does report that she has seen Dr. Dulce Sellar in the past.  Physical Exam: Physical exam performed. The pertinent findings include: Patient with overall stable vital signs, no fever, tachycardia, stable oxygen saturation on room air.  She has some tenderness to palpation in the  left upper and lower quadrant without rebound, rigidity, guarding.  Lab Tests/Imaging studies: I personally interpreted labs/imaging and the pertinent results include: Patient with mild hyperglycemia, glucose 239, CMP otherwise unremarkable, CBC is notable for mild leukocytosis, white blood cells 12.5, otherwise unremarkable.  Normal lipase.  Patient did not provide a urine sample but her symptoms do not seem consistent with urinary tract infection, she has not had any dysuria or other worrisome urinary symptoms.  Dependently interpreted CT abdomen pelvis with contrast which shows diverticulosis, some diffuse inflammation around the colon which could be consistent with  an mild diverticulitis versus chronic diverticulitis.  No other evidence of acute or surgical finding on CT scan. I agree with the radiologist interpretation.   Medications: I ordered medication including fluid bolus, Zofran, Maalox Mylanta for stomach pain, patient tolerating p.o. intake at time of discharge, given CT evidence of mild versus chronic diverticulitis I think bowel rest, Cipro, Flagyl, with GI follow up is reasonable plan at this time.  I have reviewed the patients home medicines and have made adjustments as needed.   Disposition: After consideration of the diagnostic results and the patients response to treatment, I feel that patient is stable for discharge at this time .   emergency department workup does not suggest an emergent condition requiring admission or immediate intervention beyond what has been performed at this time. The plan is: as above. The patient is safe for discharge and has been instructed to return immediately for worsening symptoms, change in symptoms or any other concerns.  Final Clinical Impression(s) / ED Diagnoses Final diagnoses:  Diverticulitis  Abdominal pain, unspecified abdominal location  Nausea vomiting and diarrhea    Rx / DC Orders ED Discharge Orders          Ordered     ciprofloxacin (CIPRO) 500 MG tablet  Every 12 hours        03/02/23 1318    metroNIDAZOLE (FLAGYL) 500 MG tablet  3 times daily        03/02/23 1318    ondansetron (ZOFRAN) 4 MG tablet  Every 6 hours PRN        03/02/23 1318              Nathen Balaban, [place] H, PA-C 03/02/23 1333    Jacalyn Lefevre, MD 03/02/23 1344

## 2023-03-02 NOTE — ED Notes (Signed)
Pt states nausea has improved after medication

## 2023-03-02 NOTE — ED Triage Notes (Signed)
Patient presents to ED via POV from home. Here with abdominal pain, nausea, vomiting and diarrhea x 1 month. Reports seeing here PCP and being told she has a "stomach bug".

## 2023-03-03 ENCOUNTER — Telehealth: Payer: Self-pay

## 2023-03-03 NOTE — Telephone Encounter (Signed)
PA submitted and approved via covermymeds for rybelsus 14 mg. [id number]

## 2023-03-14 ENCOUNTER — Other Ambulatory Visit: Payer: Self-pay | Admitting: Family Medicine

## 2023-03-17 ENCOUNTER — Other Ambulatory Visit: Payer: Self-pay | Admitting: Orthopaedic Surgery

## 2023-03-25 ENCOUNTER — Telehealth: Payer: Self-pay | Admitting: Family Medicine

## 2023-03-25 NOTE — Telephone Encounter (Signed)
Please recommend she look into patient assistance for rybelsus.  If she is willing, I will do a referral to our pharmacists at [facility], who can assist her in getting this. Dr. Sedalia Muta

## 2023-03-25 NOTE — Telephone Encounter (Signed)
Pt husband called in stating the med  Semaglutide ([name]) 14 MG TABS  after insurance is filed its costing over $800 out of the pt pocket. He stated they couldn't afford that. Please advise. Ty.

## 2023-03-25 NOTE — Telephone Encounter (Signed)
Patient is unable to afford [name] 14 mg. Please advise what to do?

## 2023-03-30 ENCOUNTER — Other Ambulatory Visit: Payer: Self-pay | Admitting: Family Medicine

## 2023-03-30 DIAGNOSIS — E782 Mixed hyperlipidemia: Secondary | ICD-10-CM

## 2023-03-30 DIAGNOSIS — I10 Essential (primary) hypertension: Secondary | ICD-10-CM

## 2023-03-30 DIAGNOSIS — E1121 Type 2 diabetes mellitus with diabetic nephropathy: Secondary | ICD-10-CM

## 2023-03-30 NOTE — Telephone Encounter (Signed)
Patient informed and she states that she is ok with being referred to our pharmacists for their help on patient assistance. Please send the referral!

## 2023-03-30 NOTE — Telephone Encounter (Signed)
Referral completed

## 2023-04-06 ENCOUNTER — Telehealth: Payer: Self-pay

## 2023-04-06 NOTE — Progress Notes (Signed)
   Care Guide Note  04/06/2023 Name: Robin Bonilla MRN: [id number] DOB: 07-25-1956  Referred by: Blane Ohara, MD Reason for referral : Care Coordination (Outreach to schedule with Pharm d )   Robin Bonilla is a 67 y.o. year old female who is a primary care patient of Cox, Kirsten, MD. [name] was referred to the pharmacist for assistance related to DM.    Successful contact was made with the patient to discuss pharmacy services including being ready for the pharmacist to call at least 5 minutes before the scheduled appointment time, to have medication bottles and any blood sugar or blood pressure readings ready for review. The patient agreed to meet with the pharmacist via with the pharmacist via telephone visit on (date/time).  04/22/2023  [name], RMA Care Guide [facility]  [place], [place] [address] Direct Dial: [phone] [name]

## 2023-04-10 ENCOUNTER — Other Ambulatory Visit: Payer: Self-pay | Admitting: Family Medicine

## 2023-04-20 ENCOUNTER — Other Ambulatory Visit: Payer: Self-pay | Admitting: Family Medicine

## 2023-04-22 ENCOUNTER — Other Ambulatory Visit: Payer: PPO

## 2023-04-22 NOTE — Progress Notes (Signed)
   04/22/2023  Patient ID: Robin Bonilla, female   DOB: April 02, 1956, 67 y.o.   MRN: [id number]  S/O Telephone visit to assist with affordability of [name]  DM -Current DM regimen:  Tresiba 20 units daily -Was taking [name] 7mg  + 2 tablets of 3mg  to equal 13mg  daily, but she has recently ran out of medication -FBG averaging 200 per patient -Does not endorse any s/sx of hypoglycemia   A/P  DM -Patient would qualify for Novo PAP for [name] -will coordinate with medication assistance team to begin application process for 14mg  [name] daily -Contacting Dr. Renea Ee office to see if they have 2 boxes of 3mg  [name] samples she can get to be able to at least take 6mg  daily while we pursue PAP -Instructed patient to increase Tresiba to 22 units daily in the absence of [name]  Follow-up:  Telephone visit to check on BG 7/31  [name], PharmD, DPLA

## 2023-04-28 ENCOUNTER — Telehealth: Payer: Self-pay

## 2023-04-28 NOTE — Progress Notes (Signed)
   04/28/2023  Patient ID: Robin Bonilla, female   DOB: 06/02/1956, 67 y.o.   MRN: [id number]  Patient outreach to inform Ms. Brannan Dr. Cox's office has set aside [name] 3mg  samples for her while we pursue PAP.  She also stated that she went to pick up the [facility] prescription and was unable to, because it was not affordable.  She will qualify for AZ&Me PAP for Xigduo, too; and I will coordinate with the medication assistance team to start this application process.  I will also see if the PCP office has any samples of this or even Farxiga/Jardiance the patient could get in the mean time.  [name], PharmD, DPLA

## 2023-04-29 ENCOUNTER — Other Ambulatory Visit: Payer: PPO

## 2023-04-29 ENCOUNTER — Telehealth: Payer: Self-pay

## 2023-04-29 NOTE — Telephone Encounter (Signed)
Mailed AZ&ME application to patients home for assistance with Xigduo medication.

## 2023-04-29 NOTE — Progress Notes (Signed)
   04/29/2023  Patient ID: Robin Bonilla, female   DOB: May 12, 1956, 67 y.o.   MRN: [id number]  S/O Telephone follow-up regarding medication access/adherence related to diabetes medications  Diabetes Management Plan -Patient recently increased to 22 units of Tresiba -She inquired about affordability assistance for Tresiba in addition to her [name] and Xigduo -Recent FBG 204 -She has continued to take [name] 5/1000mg  2 daily but is running low -She is completely out of [name] at this time  Diabetes Management Plan -Dr. Renea Ee office has samples of [name] 10/1000mg  she can take once daily while we work on PAP for prescribed dose -Office also has [name] 3mg  samples she can take 2 tablets of daily while we work on PAP for 14mg   Follow-up:  2 weeks to see if further insulin increase necessary while working on PAP applications  [name], PharmD, DPLA

## 2023-04-29 NOTE — Telephone Encounter (Signed)
Submitted application for [name] 14MG  & TRESIBA U-100 to [employer]O [employer]ORDISK for patient assistance via online portal.   Phone: [phone]

## 2023-04-29 NOTE — Telephone Encounter (Signed)
-----   Message from [name] sent at 04/28/2023  2:39 PM EDT ----- Elvina Sidle there!  I recently messaged about PAP for [name]; and when I followed up with the patient today, she informed me she went to pick up Xigduo 5/1000mg , and it was no affordable.  She would qualify for PAP through AZ&Me for this.  Would you all mind to start this application process for her, also?  Thank you!

## 2023-04-29 NOTE — Telephone Encounter (Signed)
-----   Message from [name] sent at 04/29/2023 11:12 AM EDT ----- Can we also go ahead and add her [name] 20 units daily to the Novo PAP app?  She is concerned with copay for it, too now that she is in the coverage gap.  Thank you!

## 2023-04-30 ENCOUNTER — Telehealth: Payer: Self-pay | Admitting: Family Medicine

## 2023-04-30 NOTE — Telephone Encounter (Signed)
Patient farxiga to [name] 06/999 mg.  Ideally we want her on [name] 01/999 mg 2 daily. Patient also picked up [name] 3 mg to take 2 a day. Waiting for patient assistance to come through.

## 2023-05-05 NOTE — Telephone Encounter (Signed)
Novo Nordisk Notification:  Patient approved for Enrollment with [employer] Patient Assistance Program thru 04/25/2024.  Medication should be received in 10-14 business days.

## 2023-05-06 NOTE — Progress Notes (Signed)
   05/06/2023  Patient ID: Robin Bonilla, female   DOB: September 25, 1956, 67 y.o.   MRN: [id number]  -Novo PAP for [name] and [name] 14mg  daily approved 7/28.  Medication should arrive at PCP office in 10-14 business days  -AM&Me PAP app for [name] 5/1000mg  2 tablets daily was mailed to patient's home 7/31.  Checking with medication assistance team to see if this has been returned yet.  [name], PharmD, DPLA

## 2023-05-13 ENCOUNTER — Other Ambulatory Visit: Payer: PPO

## 2023-05-13 NOTE — Progress Notes (Signed)
   05/13/2023 Name: Robin Bonilla MRN: [id number] DOB: Feb 13, 1956  Telephone follow-up to check on home BG and any PAP application updates  Subjective:  Care Team: Primary Care Provider: Blane Ohara, MD ; Next Scheduled Visit: 8/21 for 3 month fasting labs  Medication Access/Adherence Current Pharmacy:  [id number] [place], [place] - [address] [place] [address] Phone: [phone] Fax: [phone]  [facility] [address] [place] [address] Phone: [phone] Fax: [phone]  [facility] [address] [place] - [address] [facility] [place] [address] Phone: [phone] Fax: [phone]  Diabetes: Current medications: [name] 10/1000mg  daily, [name] 6mg  daily, Tresiba 22 units at bedtime -Patient was recently approved for [place] and [name] 14mg  PAP through Novo- these medications should arrive at [facility] by the end of this week -She did receive the AZ&Me PAP application for [name] 5/1000mg  BID and just needs to sign, date, and return it- plans to this week -She endorses having 1 more bottle of [name] 3mg  sample on hand, but she only has a few days remaining of the [name] 10/1000mg  samples -States BG is running high- FBG this morning 201 this morning -No episodes of hypoglycemia   Objective: Lab Results  Component Value Date   HGBA1C 9.6 (H) 01/29/2023   Assessment/Plan:   Diabetes: - Currently uncontrolled - Instructed patient to continue current regimen at this time, and to start [name] 14mg  daily as soon as PAP supply is received - Gave my direct number to call if [place] and [name] 14mg  are not received by Friday, so I can follow-up with Novo - Checking to see if Dr. Renea Ee office has anymore Xigduo samples the patient can get, because realistically she would not receive supply from PAP (if approved) for another  3-4 weeks  Follow  Up Plan: Monitoring PAP process and will follow-up with patient accordingly   [name], PharmD, DPLA

## 2023-05-19 ENCOUNTER — Telehealth: Payer: Self-pay

## 2023-05-19 NOTE — Telephone Encounter (Signed)
PT PICKED UP HER PA TRESIBA AND [name] - BLUE FORM SIGNED AND LEFT IN Creola Corn, LPN BOX

## 2023-05-19 NOTE — Telephone Encounter (Signed)
Patient made aware PA is in office for pick up

## 2023-05-20 ENCOUNTER — Ambulatory Visit: Payer: PPO | Admitting: Family Medicine

## 2023-05-25 NOTE — Telephone Encounter (Signed)
Rec'd signed patient pages  Faxed az&me pcp pgs to provider

## 2023-05-25 NOTE — Telephone Encounter (Signed)
Per LPN note 10/04/08:  [employer] Notification:   Patient approved for Enrollment with [employer] Patient Assistance Program thru 04/25/2024.  Medication should be received in 10-14 business days.

## 2023-05-27 ENCOUNTER — Telehealth: Payer: Self-pay

## 2023-05-27 ENCOUNTER — Other Ambulatory Visit: Payer: Self-pay | Admitting: Family Medicine

## 2023-05-27 NOTE — Progress Notes (Signed)
   05/27/2023  Patient ID: Robin Bonilla, female   DOB: 29-Mar-1956, 67 y.o.   MRN: [id number]  Returning missed call/voicemail from patient regarding Xigduo PAP application.  Completed patient portion has been received, and medication assistance team is awaiting provider portion to submit this to AZ&Me PAP.  I notified the patient of this and let her know I have reached out to Dr. Renea Ee office about additional samples on her behalf.  She is now out of the two week supply provided around the beginning of August.  I will follow back up with her once sample status is known.  [name], PharmD, DPLA

## 2023-05-29 NOTE — Progress Notes (Signed)
   05/29/2023  Patient ID: Robin Bonilla, female   DOB: [birth year], 67 y.o.   MRN: [id number]  Provider portion has been completed per Dr. Sedalia Muta, but the office does not have anymore Xigduo samples.  Dr. Sedalia Muta is out of the office, but suggested reaching out to clinical pool to see if they have any Synjardy samples on hand that patient could get until PAP processed and medication received.  [name], PharmD, DPLA

## 2023-06-02 ENCOUNTER — Ambulatory Visit (INDEPENDENT_AMBULATORY_CARE_PROVIDER_SITE_OTHER): Payer: PPO | Admitting: Family Medicine

## 2023-06-02 ENCOUNTER — Telehealth: Payer: Self-pay

## 2023-06-02 ENCOUNTER — Encounter: Payer: Self-pay | Admitting: Family Medicine

## 2023-06-02 VITALS — BP 110/60 | HR 78 | Temp 96.8°F | Resp 16 | Ht 60.0 in | Wt 176.4 lb

## 2023-06-02 DIAGNOSIS — Z0000 Encounter for general adult medical examination without abnormal findings: Secondary | ICD-10-CM

## 2023-06-02 DIAGNOSIS — Z23 Encounter for immunization: Secondary | ICD-10-CM | POA: Diagnosis not present

## 2023-06-02 DIAGNOSIS — E6609 Other obesity due to excess calories: Secondary | ICD-10-CM | POA: Diagnosis not present

## 2023-06-02 DIAGNOSIS — Z6834 Body mass index (BMI) 34.0-34.9, adult: Secondary | ICD-10-CM

## 2023-06-02 MED ORDER — SYNJARDY XR 12.5-1000 MG PO TB24: 1.0000 | ORAL_TABLET | Freq: Two times a day (BID) | ORAL | Status: DC

## 2023-06-02 NOTE — Telephone Encounter (Addendum)
Samples have been put up for patient  ----- Message from [name] sent at 06/02/2023 10:26 AM EDT ----- Randie Heinz, would you mind to set that aside for her?  I will let her know! ----- Message ----- From: Jomarie Longs, CMA Sent: 06/02/2023   9:20 AM EDT To: [name], RPH  Yes ma'am, 12.01/999 mg, and it has 14 tablets and we have 2 boxes ----- Message ----- From: [name], [facility] Sent: 05/29/2023  12:41 PM EDT To: Jomarie Longs, CMA  Is it the XR 12.01/999?  How many tablets in a sample?  Sorry for all of the questions! ----- Message ----- From: Jomarie Longs, CMA Sent: 05/29/2023  11:39 AM EDT To: [name], RPH  Yes, we have 12.5 mg or 10 mg ----- Message ----- From: [name], [facility] Sent: 05/29/2023  11:04 AM EDT To: [facility]  Good morning!  Do you all have any Synjardy samples patient could get?  Thank you!

## 2023-06-02 NOTE — Progress Notes (Deleted)
Subjective:  Patient ID: Robin Bonilla, female    DOB: 12/05/1955  Age: 67 y.o. MRN: [id number]  Chief Complaint  Patient presents with   Annual Exam    Welcome to Medicare     HPI Well Adult Physical: Patient here for a comprehensive physical exam.The patient reports no problems Do you take any herbs or supplements that were not prescribed by a doctor? nono Are you taking calcium supplements? no Are you taking aspirin daily? no  Encounter for general adult medical examination without abnormal findings  Physical ("At Risk" items are starred): Patient's last physical exam was 1 year ago .  Patient is not afflicted from Stress Incontinence and Urge Incontinence  Patient wears a seat belts Patient has smoke detectors and has carbon monoxide detectors. Patient practices appropriate gun safety. Patient wears sunscreen with extended sun exposure. Dental Care: biannual cleanings, brushes and flosses daily. Ophthalmology/Optometry: Annual visit.  Hearing loss: none Vision impairments: none     06/02/2023    1:42 PM 01/29/2023    7:50 AM 10/24/2022    8:48 AM 01/14/2022   11:23 AM 12/25/2021    9:04 AM  Depression screen PHQ 2/9  Decreased Interest 0 0 0 1 1  Down, Depressed, Hopeless 0 0 2 1 1   PHQ - 2 Score 0 0 2 2 2   Altered sleeping   2 2 1   Tired, decreased energy   3 1 1   Change in appetite   0 1 1  Feeling bad or failure about yourself    3 2 2   Trouble concentrating   2 1 2   Moving slowly or fidgety/restless   2 1 3   Suicidal thoughts   1 0 0  PHQ-9 Score   15 10 12   Difficult doing work/chores   Very difficult Somewhat difficult Very difficult         01/29/2022    8:30 AM 10/24/2022    8:48 AM 01/21/2023    3:15 PM 01/29/2023    7:49 AM 06/02/2023    2:10 PM  Fall Risk  Falls in the past year?  0 0 0 0  Was there an injury with Fall?  0 0  0  Fall Risk Category Calculator  0 0  0  (RETIRED) Patient Fall Risk Level Low fall risk      Patient at Risk for Falls Due  to  No Fall Risks No Fall Risks No Fall Risks No Fall Risks  Fall risk Follow up  Falls evaluation completed Falls evaluation completed Falls evaluation completed;Falls prevention discussed Falls evaluation completed     Current Exercise Habits: The patient does not participate in regular exercise at present Exercise limited by: None identified     Social Hx   Social History   Socioeconomic History   Marital status: Married    Spouse name: Not on file   Number of children: Not on file   Years of education: Not on file   Highest education level: Not on file  Occupational History   Not on file  Tobacco Use   Smoking status: Never   Smokeless tobacco: Never  Substance and Sexual Activity   Alcohol use: No    Alcohol/week: 0.0 standard drinks of alcohol   Drug use: No   Sexual activity: Not on file  Other Topics Concern   Not on file  Social History Narrative   Not on file   Social Determinants of Health   Financial Resource Strain: Low Risk  (  06/02/2023)   Overall Financial Resource Strain (CARDIA)    Difficulty of Paying Living Expenses: Not hard at all  Food Insecurity: No Food Insecurity (06/02/2023)   Hunger Vital Sign    Worried About Running Out of Food in the Last Year: Never true    Ran Out of Food in the Last Year: Never true  Transportation Needs: No Transportation Needs (12/02/2022)   PRAPARE - Administrator, Civil Service (Medical): No    Lack of Transportation (Non-Medical): No  Physical Activity: Inactive (06/02/2023)   Exercise Vital Sign    Days of Exercise per Week: 0 days    Minutes of Exercise per Session: 0 min  Stress: No Stress Concern Present (06/02/2023)   [employer] of Occupational Health - Occupational Stress Questionnaire    Feeling of Stress : Only a little  Social Connections: Socially Integrated (06/02/2023)   Social Connection and Isolation Panel [NHANES]    Frequency of Communication with Friends and Family: More than three  times a week    Frequency of Social Gatherings with Friends and Family: More than three times a week    Attends Religious Services: More than 4 times per year    Active Member of Clubs or Organizations: Yes    Attends Banker Meetings: More than 4 times per year    Marital Status: Married   Past Medical History:  Diagnosis Date   Acute sinusitis    Depression    Diabetes mellitus 2005   Dysthymic disorder    Hirsutism    Hyperlipidemia    Rash, skin    Thrombocytopenia (HCC) 09/14/2021   Thyroid disease    Upper respiratory tract infection due to COVID-19 virus 08/27/2022   Past Surgical History:  Procedure Laterality Date   BACK SURGERY     CHOLECYSTECTOMY  05/30/11   PARTIAL HYSTERECTOMY     TONSILLECTOMY     TUBAL LIGATION      Family History  Problem Relation Age of Onset   Breast cancer Mother 11   CAD Father    CAD Maternal Grandmother    Hypertension Maternal Grandmother    Diabetes Paternal Grandfather    Diabetes Paternal Aunt     Review of Systems  Constitutional:  Negative for chills, fatigue and fever.  HENT:  Negative for congestion, ear pain and sore throat.   Respiratory:  Negative for cough and shortness of breath.   Cardiovascular:  Negative for chest pain.     Objective:  BP 110/60   Pulse 78   Temp (!) 96.8 F (36 C)   Resp 16   Ht 5' (1.524 m)   Wt 176 lb 6.4 oz (80 kg)   BMI 34.45 kg/m      06/02/2023    1:35 PM 03/02/2023   12:00 PM 03/02/2023   11:09 AM  BP/Weight  Systolic BP 110 135   Diastolic BP 60 61   Wt. (Lbs) 176.4  170  BMI 34.45 kg/m2  33.2 kg/m2    Physical Exam Vitals reviewed.  Constitutional:      Appearance: Normal appearance. She is normal weight.  HENT:     Right Ear: Tympanic membrane, ear canal and external ear normal.     Left Ear: Tympanic membrane, ear canal and external ear normal.     Nose: Nose normal.     Mouth/Throat:     Pharynx: Oropharynx is clear.  Neck:     Vascular: No carotid  bruit.  Cardiovascular:     Rate and Rhythm: Normal rate and regular rhythm.     Heart sounds: Normal heart sounds. No murmur heard. Pulmonary:     Effort: Pulmonary effort is normal. No respiratory distress.     Breath sounds: Normal breath sounds.  Abdominal:     General: Abdomen is flat. Bowel sounds are normal.     Palpations: Abdomen is soft.     Tenderness: There is no abdominal tenderness.  Neurological:     Mental Status: She is alert and oriented to person, place, and time.  Psychiatric:        Mood and Affect: Mood normal.        Behavior: Behavior normal.     Lab Results  Component Value Date   WBC 12.5 (H) 03/02/2023   HGB 14.4 03/02/2023   HCT 44.2 03/02/2023   PLT 242 03/02/2023   GLUCOSE 239 (H) 03/02/2023   CHOL 110 01/29/2023   TRIG 177 (H) 01/29/2023   HDL 28 (L) 01/29/2023   LDLCALC 52 01/29/2023   ALT 25 03/02/2023   AST 35 03/02/2023   NA 136 03/02/2023   K 4.2 03/02/2023   CL 101 03/02/2023   CREATININE 0.98 03/02/2023   BUN 19 03/02/2023   CO2 21 (L) 03/02/2023   TSH 3.930 01/29/2023   HGBA1C 9.6 (H) 01/29/2023   MICROALBUR 80 09/26/2021      Assessment & Plan:  Routine general medical examination at a health care facility  Encounter for Medicare annual wellness exam     Body mass index is 34.45 kg/m.   These are the goals we discussed:  Goals   None      This is a list of the screening recommended for you and due dates:  Health Maintenance  Topic Date Due   DTaP/Tdap/Td vaccine (1 - Tdap) Never done   Zoster (Shingles) Vaccine (1 of 2) Never done   Colon Cancer Screening  05/01/2022   Pneumonia Vaccine (1 of 1 - PCV) Never done   DEXA scan (bone density measurement)  Never done   Flu Shot  04/30/2023   COVID-19 Vaccine (1 - 2023-24 season) Never done   Hemoglobin A1C  08/01/2023   Complete foot exam   10/25/2023   Yearly kidney health urinalysis for diabetes  01/29/2024   Eye exam for diabetics  02/10/2024   Yearly  kidney function blood test for diabetes  03/01/2024   Medicare Annual Wellness Visit  06/01/2024   Mammogram  01/06/2025   Hepatitis C Screening  Completed   HIV Screening  Completed   HPV Vaccine  Aged Out     No orders of the defined types were placed in this encounter.   Follow-up: No follow-ups on file.  An After Visit Summary was printed and given to the patient.  Blane Ohara, MD [facility] [phone]

## 2023-06-02 NOTE — Progress Notes (Signed)
   06/02/2023  Patient ID: Robin Bonilla, female   DOB: 10-Sep-1956, 67 y.o.   MRN: [id number]  Patient outreach attempt to inform  [name] that Dr. Sedalia Muta has completed the provider portion of the Xigduo PAP application; so the completed forms should be received by the company for processing soon.  Office does not have additional samples of Xigduo, but has set aside Synjardy XR 12.5/1000mg  daily sample patient can take in the mean time.  I was not able to reach [name] or leave a voicemail but will attempt to reach her again tomorrow.  [name], PharmD, DPLA

## 2023-06-04 NOTE — Assessment & Plan Note (Signed)
Well controlled.  No changes to medicines.  lisinopril 10 mg daily.  Continue to work on eating a healthy diet and exercise.  Labs drawn today.

## 2023-06-04 NOTE — Assessment & Plan Note (Addendum)
Control: very poor. Worsened. Recommend check sugars fasting daily. Recommend check feet daily. Recommend annual eye exams. Medicines: xigduo xr 06/999 mg 2 daily, but patient has not taking over a week. She is taking synjardy xr 12.01/999 mg 2 daily until her PAP comes in. I sent samples with her until she can get xigduo. continue tresiba 20 U before bed. [name] 14 mg daily. Increase tresiba every 3 days by 2 U until sugars less than 150 consistently. Continue to work on eating a healthy diet and exercise.  Labs drawn today.

## 2023-06-04 NOTE — Progress Notes (Unsigned)
Subjective:  Patient ID: Robin Bonilla, female    DOB: 03-18-1956  Age: 67 y.o. MRN: [id number]  Chief Complaint  Patient presents with   Medical Management of Chronic Issues    HPI Diabetes:  Complications: hyperlipidemia Glucose checking: fasting daily Glucose logs: 200-270 Hypoglycemia: no Most recent A1C: 9.6 Current medications: Was on xigduo xr 06/999 mg 2 daily, but ran out about a week ago.  She is waiting on patient assistance to come through.  Continue tresiba 20 U before bed. [name] 14 mg daily. Last Eye Exam:02/10/23 Foot checks: daily  Hyperlipidemia: Current medications: Vascepa 1 g 2 capsules twice daily, fenofibrate 160 mg once daily and rosuvastatin 20 mg nightly.   Hypertension: Complications: diabetes Current medications: Lisinopril 10 mg daily.  Diet: low sugar diet Exercise: not exercise  Hypothyroidism: Taking Levothyroxine 100 mcg daily  Patient mentioned ear pain, and fullness and pop up since one month     06/02/2023    1:42 PM 01/29/2023    7:50 AM 10/24/2022    8:48 AM 01/14/2022   11:23 AM 12/25/2021    9:04 AM  Depression screen PHQ 2/9  Decreased Interest 0 0 0 1 1  Down, Depressed, Hopeless 0 0 2 1 1   PHQ - 2 Score 0 0 2 2 2   Altered sleeping   2 2 1   Tired, decreased energy   3 1 1   Change in appetite   0 1 1  Feeling bad or failure about yourself    3 2 2   Trouble concentrating   2 1 2   Moving slowly or fidgety/restless   2 1 3   Suicidal thoughts   1 0 0  PHQ-9 Score   15 10 12   Difficult doing work/chores   Very difficult Somewhat difficult Very difficult        06/02/2023    2:10 PM  Fall Risk   Falls in the past year? 0  Number falls in past yr: 0  Injury with Fall? 0  Risk for fall due to : No Fall Risks  Follow up Falls evaluation completed    Patient Care Team: Blane Ohara, MD as PCP - General (Family Medicine)   Review of Systems  Constitutional:  Negative for chills, fatigue and fever.  HENT:  Positive  for ear pain. Negative for congestion and sore throat.   Respiratory:  Negative for cough and shortness of breath.   Cardiovascular:  Negative for chest pain and palpitations.  Gastrointestinal:  Negative for abdominal pain, constipation, diarrhea, nausea and vomiting.  Endocrine: Negative for polydipsia, polyphagia and polyuria.  Genitourinary:  Negative for difficulty urinating and dysuria.  Musculoskeletal:  Negative for arthralgias, back pain and myalgias.  Skin:  Negative for rash.  Neurological:  Negative for headaches.  Psychiatric/Behavioral:  Negative for dysphoric mood. The patient is not nervous/anxious.     Current Outpatient Medications on File Prior to Visit  Medication Sig Dispense Refill   ACCU-CHEK GUIDE test strip TEST STRIPS TWICE A DAY 200 each 3   Empagliflozin-metFORMIN HCl ER (SYNJARDY XR) 12.01-999 MG TB24 Take 1 tablet by mouth 2 (two) times daily.     fenofibrate 160 MG tablet TAKE 1 TABLET BY MOUTH EVERY DAY 90 tablet 1   fluticasone (FLONASE) 50 MCG/ACT nasal spray SPRAY 2 SPRAYS INTO EACH NOSTRIL EVERY DAY 48 mL 1   icosapent Ethyl (VASCEPA) 1 g capsule TAKE 2 CAPSULES BY MOUTH 2 TIMES DAILY. 360 capsule 2   insulin degludec (TRESIBA FLEXTOUCH)  200 UNIT/ML FlexTouch Pen Inject 20 Units into the skin at bedtime. (Patient taking differently: Inject 22 Units into the skin at bedtime.) 9 mL 0   Insulin Pen Needle 32G X 4 MM MISC 1 Needle by Does not apply route daily. 100 each 3   levothyroxine (SYNTHROID) 100 MCG tablet Take 1 tablet (100 mcg total) by mouth daily before breakfast. 90 tablet 3   lisinopril (ZESTRIL) 10 MG tablet TAKE 1 TABLET BY MOUTH EVERY DAY 90 tablet 0   ondansetron (ZOFRAN) 4 MG tablet Take 1 tablet (4 mg total) by mouth every 6 (six) hours as needed for nausea or vomiting. 15 tablet 0   rosuvastatin (CRESTOR) 20 MG tablet TAKE 1 TABLET BY MOUTH EVERY DAY 90 tablet 1   Semaglutide ([name]) 14 MG TABS Take 1 tablet by mouth daily.      Dapagliflozin Pro-metFORMIN ER ([name]) 06-999 MG TB24 Take 1 tablet by mouth daily. (Patient not taking: Reported on 06/05/2023)     No current facility-administered medications on file prior to visit.   Past Medical History:  Diagnosis Date   Acute sinusitis    Depression    Diabetes mellitus 2005   Dysthymic disorder    Hirsutism    Hyperlipidemia    Rash, skin    Thrombocytopenia (HCC) 09/14/2021   Thyroid disease    Upper respiratory tract infection due to COVID-19 virus 08/27/2022   Past Surgical History:  Procedure Laterality Date   BACK SURGERY     CHOLECYSTECTOMY  05/30/11   PARTIAL HYSTERECTOMY     TONSILLECTOMY     TUBAL LIGATION      Family History  Problem Relation Age of Onset   Breast cancer Mother 47   CAD Father    CAD Maternal Grandmother    Hypertension Maternal Grandmother    Diabetes Paternal Grandfather    Diabetes Paternal Aunt    Social History   Socioeconomic History   Marital status: Married    Spouse name: Not on file   Number of children: Not on file   Years of education: Not on file   Highest education level: Not on file  Occupational History   Not on file  Tobacco Use   Smoking status: Never   Smokeless tobacco: Never  Substance and Sexual Activity   Alcohol use: No    Alcohol/week: 0.0 standard drinks of alcohol   Drug use: No   Sexual activity: Not on file  Other Topics Concern   Not on file  Social History Narrative   Not on file   Social Determinants of Health   Financial Resource Strain: Low Risk  (06/02/2023)   Overall Financial Resource Strain (CARDIA)    Difficulty of Paying Living Expenses: Not hard at all  Food Insecurity: No Food Insecurity (06/02/2023)   Hunger Vital Sign    Worried About Running Out of Food in the Last Year: Never true    Ran Out of Food in the Last Year: Never true  Transportation Needs: No Transportation Needs (12/02/2022)   PRAPARE - Administrator, Civil Service (Medical): No     Lack of Transportation (Non-Medical): No  Physical Activity: Inactive (06/02/2023)   Exercise Vital Sign    Days of Exercise per Week: 0 days    Minutes of Exercise per Session: 0 min  Stress: No Stress Concern Present (06/02/2023)   [employer] of Occupational Health - Occupational Stress Questionnaire    Feeling of Stress : Only a  little  Social Connections: Socially Integrated (06/02/2023)   Social Connection and Isolation Panel [NHANES]    Frequency of Communication with Friends and Family: More than three times a week    Frequency of Social Gatherings with Friends and Family: More than three times a week    Attends Religious Services: More than 4 times per year    Active Member of [employer] or Organizations: Yes    Attends Engineer, structural: More than 4 times per year    Marital Status: Married    Objective:  BP 110/60   Pulse 77   Temp 97.7 F (36.5 C)   Resp 14   Ht 5' (1.524 m)   Wt 175 lb (79.4 kg)   SpO2 98%   BMI 34.18 kg/m      06/05/2023    7:55 AM 06/02/2023    1:35 PM 03/02/2023   12:00 PM  BP/Weight  Systolic BP 110 110 135  Diastolic BP 60 60 61  Wt. (Lbs) 175 176.4   BMI 34.18 kg/m2 34.45 kg/m2     Physical Exam Vitals reviewed.  Constitutional:      Appearance: Normal appearance.  HENT:     Right Ear: Tympanic membrane normal.     Left Ear: Tympanic membrane normal.     Nose: Nose normal.     Comments: Swelling and pale specifically the turbinate    Mouth/Throat:     Mouth: Mucous membranes are moist.     Pharynx: No oropharyngeal exudate or posterior oropharyngeal erythema.  Neck:     Vascular: No carotid bruit.  Cardiovascular:     Rate and Rhythm: Normal rate and regular rhythm.     Pulses: Normal pulses.     Heart sounds: Normal heart sounds.     Comments: Small murmur Pulmonary:     Effort: Pulmonary effort is normal.     Breath sounds: Normal breath sounds.  Abdominal:     General: Bowel sounds are normal.     Palpations:  Abdomen is soft.     Tenderness: There is no abdominal tenderness.  Lymphadenopathy:     Cervical: No cervical adenopathy.  Skin:    Findings: No lesion or rash.  Neurological:     Mental Status: She is alert and oriented to person, place, and time.  Psychiatric:        Mood and Affect: Mood normal.        Behavior: Behavior normal.     Diabetic Foot Exam - Simple   Simple Foot Form  06/05/2023  9:45 PM  Visual Inspection No deformities, no ulcerations, no other skin breakdown bilaterally: Yes Sensation Testing Intact to touch and monofilament testing bilaterally: Yes Pulse Check Posterior Tibialis and Dorsalis pulse intact bilaterally: Yes Comments      Lab Results  Component Value Date   WBC 8.4 06/05/2023   HGB 14.7 06/05/2023   HCT 45.8 06/05/2023   PLT 248 06/05/2023   GLUCOSE 192 (H) 06/05/2023   CHOL 141 06/05/2023   TRIG 185 (H) 06/05/2023   HDL 32 (L) 06/05/2023   LDLCALC 77 06/05/2023   ALT 25 06/05/2023   AST 25 06/05/2023   NA 139 06/05/2023   K 4.5 06/05/2023   CL 98 06/05/2023   CREATININE 1.00 06/05/2023   BUN 15 06/05/2023   CO2 24 06/05/2023   TSH 2.070 06/05/2023   HGBA1C 11.3 (H) 06/05/2023   MICROALBUR 80 09/26/2021      Assessment & Plan:  Hypertension associated with diabetes [facility]) Assessment & Plan: Well controlled.  No changes to medicines.  lisinopril 10 mg daily.  Continue to work on eating a healthy diet and exercise.  Labs drawn today.    Orders: -     CBC with Differential/Platelet -     Comprehensive metabolic panel  Hypothyroidism (acquired) Assessment & Plan: Previously well controlled Continue Synthroid at current dose  Recheck TSH and adjust Synthroid as indicated    Orders: -     TSH  Diabetic glomerulopathy (HCC) Assessment & Plan: Control: very poor. Worsened. Recommend check sugars fasting daily. Recommend check feet daily. Recommend annual eye exams. Medicines: xigduo xr 06/999 mg 2 daily, but  patient has not taking over a week. She is taking synjardy xr 12.01/999 mg 2 daily until her PAP comes in. I sent samples with her until she can get xigduo. continue tresiba 20 U before bed. [name] 14 mg daily. Increase tresiba every 3 days by 2 U until sugars less than 150 consistently. Continue to work on eating a healthy diet and exercise.  Labs drawn today.     Orders: -     Hemoglobin A1c  Mixed hyperlipidemia Assessment & Plan: Well controlled.  No changes to medicines. Vascepa 1 g 2 capsules twice daily, fenofibrate 160 mg once daily and rosuvastatin 20 mg nightly.  Continue to work on eating a healthy diet and exercise.  Labs drawn today.    Orders: -     Lipid panel  Encounter for immunization -     Flu Vaccine Trivalent High Dose (Fluad)  Class 1 obesity due to excess calories with serious comorbidity and body mass index (BMI) of 34.0 to 34.9 in adult     No orders of the defined types were placed in this encounter.   Orders Placed This Encounter  Procedures   Flu Vaccine Trivalent High Dose (Fluad)   CBC with Differential/Platelet   Comprehensive metabolic panel   Hemoglobin A1c   Lipid panel   TSH     Follow-up: Return in about 3 months (around 09/04/2023) for chronic fasting.   I,Marla I Leal-Borjas,acting as a scribe for Blane Ohara, MD.,have documented all relevant documentation on the behalf of Blane Ohara, MD,as directed by  Blane Ohara, MD while in the presence of Blane Ohara, MD.   An After Visit Summary was printed and given to the patient.  Blane Ohara, MD [facility] [phone]

## 2023-06-04 NOTE — Assessment & Plan Note (Signed)
Well controlled.  No changes to medicines. Vascepa 1 g 2 capsules twice daily, fenofibrate 160 mg once daily and rosuvastatin 20 mg nightly.  Continue to work on eating a healthy diet and exercise.  Labs drawn today.   

## 2023-06-04 NOTE — Assessment & Plan Note (Signed)
Previously well controlled Continue Synthroid at current dose  Recheck TSH and adjust Synthroid as indicated   

## 2023-06-05 ENCOUNTER — Encounter: Payer: Self-pay | Admitting: Family Medicine

## 2023-06-05 ENCOUNTER — Ambulatory Visit (INDEPENDENT_AMBULATORY_CARE_PROVIDER_SITE_OTHER): Payer: PPO | Admitting: Family Medicine

## 2023-06-05 VITALS — BP 110/60 | HR 77 | Temp 97.7°F | Resp 14 | Ht 60.0 in | Wt 175.0 lb

## 2023-06-05 DIAGNOSIS — E6609 Other obesity due to excess calories: Secondary | ICD-10-CM

## 2023-06-05 DIAGNOSIS — Z6834 Body mass index (BMI) 34.0-34.9, adult: Secondary | ICD-10-CM

## 2023-06-05 DIAGNOSIS — E782 Mixed hyperlipidemia: Secondary | ICD-10-CM | POA: Diagnosis not present

## 2023-06-05 DIAGNOSIS — E1159 Type 2 diabetes mellitus with other circulatory complications: Secondary | ICD-10-CM

## 2023-06-05 DIAGNOSIS — E1121 Type 2 diabetes mellitus with diabetic nephropathy: Secondary | ICD-10-CM | POA: Diagnosis not present

## 2023-06-05 DIAGNOSIS — E039 Hypothyroidism, unspecified: Secondary | ICD-10-CM

## 2023-06-05 DIAGNOSIS — Z23 Encounter for immunization: Secondary | ICD-10-CM

## 2023-06-05 DIAGNOSIS — E66811 Obesity, class 1: Secondary | ICD-10-CM

## 2023-06-05 DIAGNOSIS — I152 Hypertension secondary to endocrine disorders: Secondary | ICD-10-CM | POA: Diagnosis not present

## 2023-06-05 LAB — CBC WITH DIFFERENTIAL/PLATELET
Basophils Absolute: 0.1 10*3/uL (ref 0.0–0.2)
Basos: 1 %
EOS (ABSOLUTE): 0.3 10*3/uL (ref 0.0–0.4)
Eos: 4 %
Hematocrit: 45.8 % (ref 34.0–46.6)
Hemoglobin: 14.7 g/dL (ref 11.1–15.9)
Immature Grans (Abs): 0 10*3/uL (ref 0.0–0.1)
Immature Granulocytes: 1 %
Lymphocytes Absolute: 2.8 10*3/uL (ref 0.7–3.1)
Lymphs: 33 %
MCH: 25.9 pg — ABNORMAL LOW (ref 26.6–33.0)
MCHC: 32.1 g/dL (ref 31.5–35.7)
MCV: 81 fL (ref 79–97)
Monocytes Absolute: 0.5 10*3/uL (ref 0.1–0.9)
Monocytes: 6 %
Neutrophils Absolute: 4.7 10*3/uL (ref 1.4–7.0)
Neutrophils: 55 %
Platelets: 248 10*3/uL (ref 150–450)
RBC: 5.67 x10E6/uL — ABNORMAL HIGH (ref 3.77–5.28)
RDW: 13.7 % (ref 11.7–15.4)
WBC: 8.4 10*3/uL (ref 3.4–10.8)

## 2023-06-05 LAB — COMPREHENSIVE METABOLIC PANEL
ALT: 25 IU/L (ref 0–32)
AST: 25 IU/L (ref 0–40)
Albumin: 4.6 g/dL (ref 3.9–4.9)
Alkaline Phosphatase: 80 IU/L (ref 44–121)
BUN/Creatinine Ratio: 15 (ref 12–28)
BUN: 15 mg/dL (ref 8–27)
Bilirubin Total: 1.1 mg/dL (ref 0.0–1.2)
CO2: 24 mmol/L (ref 20–29)
Calcium: 10.1 mg/dL (ref 8.7–10.3)
Chloride: 98 mmol/L (ref 96–106)
Creatinine, Ser: 1 mg/dL (ref 0.57–1.00)
Globulin, Total: 2.8 g/dL (ref 1.5–4.5)
Glucose: 192 mg/dL — ABNORMAL HIGH (ref 70–99)
Potassium: 4.5 mmol/L (ref 3.5–5.2)
Sodium: 139 mmol/L (ref 134–144)
Total Protein: 7.4 g/dL (ref 6.0–8.5)
eGFR: 63 mL/min/{1.73_m2} (ref >59)

## 2023-06-05 LAB — HEMOGLOBIN A1C
Est. average glucose Bld gHb Est-mCnc: 278 mg/dL
Hgb A1c MFr Bld: 11.3 % — ABNORMAL HIGH (ref 4.8–5.6)

## 2023-06-05 LAB — LIPID PANEL
Chol/HDL Ratio: 4.4 ratio (ref 0.0–4.4)
Cholesterol, Total: 141 mg/dL (ref 100–199)
HDL: 32 mg/dL — ABNORMAL LOW (ref >39)
LDL Chol Calc (NIH): 77 mg/dL (ref 0–99)
Triglycerides: 185 mg/dL — ABNORMAL HIGH (ref 0–149)
VLDL Cholesterol Cal: 32 mg/dL (ref 5–40)

## 2023-06-05 LAB — TSH: TSH: 2.07 u[IU]/mL (ref 0.450–4.500)

## 2023-06-05 NOTE — Patient Instructions (Signed)
Increase tresiba every 3 days by 2 U until sugars less than 150 consistently. Dr. Sedalia Muta

## 2023-06-06 DIAGNOSIS — Z0000 Encounter for general adult medical examination without abnormal findings: Secondary | ICD-10-CM | POA: Insufficient documentation

## 2023-06-06 NOTE — Assessment & Plan Note (Signed)

## 2023-06-06 NOTE — Patient Instructions (Signed)

## 2023-06-06 NOTE — Progress Notes (Unsigned)
Subjective:    Robin Bonilla is a 67 y.o. female who presents for a Welcome to Medicare exam.   Review of Systems *** Cardiac Risk Factors include: advanced age (>6men, >78 women);diabetes mellitus      Objective:    Today's Vitals   06/02/23 1335  BP: 110/60  Pulse: 78  Resp: 16  Temp: (!) 96.8 F (36 C)  Weight: 176 lb 6.4 oz (80 kg)  Height: 5' (1.524 m)  Body mass index is 34.45 kg/m.  Medications Outpatient Encounter Medications as of 06/02/2023  Medication Sig   Empagliflozin-metFORMIN HCl ER (SYNJARDY XR) 12.01-999 MG TB24 Take 1 tablet by mouth 2 (two) times daily.   ACCU-CHEK GUIDE test strip TEST STRIPS TWICE A DAY   Dapagliflozin Pro-metFORMIN ER ([name]) 06-999 MG TB24 Take 1 tablet by mouth daily. (Patient not taking: Reported on 06/05/2023)   fenofibrate 160 MG tablet TAKE 1 TABLET BY MOUTH EVERY DAY   fluticasone (FLONASE) 50 MCG/ACT nasal spray SPRAY 2 SPRAYS INTO EACH NOSTRIL EVERY DAY   icosapent Ethyl (VASCEPA) 1 g capsule TAKE 2 CAPSULES BY MOUTH 2 TIMES DAILY.   insulin degludec (TRESIBA FLEXTOUCH) 200 UNIT/ML FlexTouch Pen Inject 20 Units into the skin at bedtime. (Patient taking differently: Inject 22 Units into the skin at bedtime.)   Insulin Pen Needle 32G X 4 MM MISC 1 Needle by Does not apply route daily.   levothyroxine (SYNTHROID) 100 MCG tablet Take 1 tablet (100 mcg total) by mouth daily before breakfast.   lisinopril (ZESTRIL) 10 MG tablet TAKE 1 TABLET BY MOUTH EVERY DAY   ondansetron (ZOFRAN) 4 MG tablet Take 1 tablet (4 mg total) by mouth every 6 (six) hours as needed for nausea or vomiting.   rosuvastatin (CRESTOR) 20 MG tablet TAKE 1 TABLET BY MOUTH EVERY DAY   Semaglutide ([name]) 14 MG TABS Take 1 tablet by mouth daily.   [DISCONTINUED] ciprofloxacin (CIPRO) 500 MG tablet Take 1 tablet (500 mg total) by mouth every 12 (twelve) hours.   [DISCONTINUED] citalopram (CELEXA) 20 MG tablet TAKE 1 TABLET BY MOUTH EVERY DAY (Patient  not taking: Reported on 06/02/2023)   [DISCONTINUED] Dapagliflozin Pro-metFORMIN ER ([name]) 01-999 MG TB24 Take 2 tablets by mouth daily. (Patient not taking: Reported on 05/06/2023)   [DISCONTINUED] diclofenac (VOLTAREN) 75 MG EC tablet Take 1 tablet (75 mg total) by mouth 2 (two) times daily.   [DISCONTINUED] metroNIDAZOLE (FLAGYL) 500 MG tablet Take 1 tablet (500 mg total) by mouth 3 (three) times daily.   [DISCONTINUED] Semaglutide ([name]) 3 MG TABS Take 2 tablets by mouth daily.   No facility-administered encounter medications on file as of 06/02/2023.     History: Past Medical History:  Diagnosis Date   Acute sinusitis    Depression    Diabetes mellitus 2005   Dysthymic disorder    Hirsutism    Hyperlipidemia    Rash, skin    Thrombocytopenia (HCC) 09/14/2021   Thyroid disease    Upper respiratory tract infection due to COVID-19 virus 08/27/2022   Past Surgical History:  Procedure Laterality Date   BACK SURGERY     CHOLECYSTECTOMY  05/30/11   PARTIAL HYSTERECTOMY     TONSILLECTOMY     TUBAL LIGATION      Family History  Problem Relation Age of Onset   Breast cancer Mother 56   CAD Father    CAD Maternal Grandmother    Hypertension Maternal Grandmother    Diabetes Paternal Grandfather  Diabetes Paternal Aunt    Social History   Occupational History   Not on file  Tobacco Use   Smoking status: Never   Smokeless tobacco: Never  Substance and Sexual Activity   Alcohol use: No    Alcohol/week: 0.0 standard drinks of alcohol   Drug use: No   Sexual activity: Not on file    Tobacco Counseling Counseling given: Not Answered   Immunizations and Health Maintenance Immunization History  Administered Date(s) Administered   Fluad Trivalent(High Dose 65+) 06/05/2023   Influenza Inj Mdck Quad Pf 07/06/2020   Influenza, Quadrivalent, Recombinant, Inj, Pf 06/24/2018   Influenza,inj,Quad PF,6+ Mos 06/30/2015   Influenza-Unspecified 07/03/2022   PNEUMOCOCCAL  CONJUGATE-20 06/02/2023   Health Maintenance Due  Topic Date Due   DTaP/Tdap/Td (1 - Tdap) Never done   Zoster Vaccines- Shingrix (1 of 2) Never done   Colonoscopy  05/01/2022   DEXA SCAN  Never done   COVID-19 Vaccine (1 - 2023-24 season) Never done    Activities of Daily Living    06/02/2023    2:11 PM 01/21/2023    3:16 PM  In your present state of health, do you have any difficulty performing the following activities:  Hearing? 1 0  Comment ears popping   Vision? 0 0  Difficulty concentrating or making decisions? 1 0  Walking or climbing stairs? 0 0  Dressing or bathing? 0 0  Doing errands, shopping? 0 0  Preparing Food and eating ? N   Using the Toilet? N   In the past six months, have you accidently leaked urine? N   Do you have problems with loss of bowel control? N   Managing your Medications? N   Managing your Finances? N   Housekeeping or managing your Housekeeping? N     Physical Exam  ***(optional), or other factors deemed appropriate based on the beneficiary's medical and social history and current clinical standards.  Advanced Directives: Does Patient Have a Medical Advance Directive?: No    Assessment:    This is a routine wellness examination for this patient . ***  Vision/Hearing screen No results found.   Goals   None    Depression Screen    06/02/2023    1:42 PM 01/29/2023    7:50 AM 10/24/2022    8:48 AM 01/14/2022   11:23 AM  PHQ 2/9 Scores  PHQ - 2 Score 0 0 2 2  PHQ- 9 Score   15 10     Fall Risk    06/02/2023    2:10 PM  Fall Risk   Falls in the past year? 0  Number falls in past yr: 0  Injury with Fall? 0  Risk for fall due to : No Fall Risks  Follow up Falls evaluation completed    Cognitive Function:        Patient Care Team: Len Azeez, Fritzi Mandes, MD as PCP - General (Family Medicine)     Plan:   ***  I have personally reviewed and noted the following in the patient's chart:   Medical and social history Use of alcohol,  tobacco or illicit drugs  Current medications and supplements Functional ability and status Nutritional status Physical activity Advanced directives List of other physicians Hospitalizations, surgeries, and ER visits in previous 12 months Vitals Screenings to include cognitive, depression, and falls Referrals and appointments  In addition, I have reviewed and discussed with patient certain preventive protocols, quality metrics, and best practice recommendations. A written personalized care plan  for preventive services as well as general preventive health recommendations were provided to patient.     Eugenie Norrie, CMA 06/06/2023   I,Marla I Leal-Borjas,acting as a scribe for Blane Ohara, MD.,have documented all relevant documentation on the behalf of Blane Ohara, MD,as directed by  Blane Ohara, MD while in the presence of Blane Ohara, MD.

## 2023-06-07 DIAGNOSIS — Z23 Encounter for immunization: Secondary | ICD-10-CM | POA: Insufficient documentation

## 2023-06-07 NOTE — Assessment & Plan Note (Signed)
Recommend continue to work on eating healthy diet and exercise. Comorbidities: hyperlipidemia and diabetes.

## 2023-06-09 ENCOUNTER — Other Ambulatory Visit: Payer: PPO | Admitting: Pharmacist

## 2023-06-09 NOTE — Telephone Encounter (Signed)
Rec'd letter from [employer] regarding incomplete application. Provider pgs were faxed to company. Pt pages are needed. Will fax.

## 2023-06-10 NOTE — Progress Notes (Signed)
06/10/2023 Name: Robin Bonilla MRN: [id number] DOB: [birth year]  No chief complaint on file.   Robin Bonilla is a 67 y.o. year old female who presented for a telephone visit.   They were referred to the pharmacist by their PCP for assistance in managing diabetes and medication access.    Subjective:  Care Team: Primary Care Provider: Blane Ohara, MD  Medication Access/Adherence  [facility] Pharmacy:  [phone] [place] [place] - [address] [place] [address] Phone: [phone] Fax: [phone]  [facility] [address] [place] [address] Phone: [phone] Fax: [phone]  [facility] [address] [place] - [address] [facility] [place] [address] Phone: [phone] Fax: [phone]    Diabetes:  Current medications: synjardy sample BID (bridge until xigduo PAP arrives), tresiba 28 units daily (instructed to increase until FBG <150), rybelsus 14mg  daily  Current glucose readings: 180-200s fasting, A1c uptrend as she had run out of patient assistance medications Using traditional meter; testing 1 times daily   Patient denies hypoglycemic s/sx including dizziness, shakiness, sweating. Patient denies hyperglycemic symptoms including polyuria, polydipsia, polyphagia, nocturia, neuropathy, blurred vision.   Current medication access support: tresiba and rybelsus via novonordisk, xigduo via [employer]   Objective:  Lab Results  Component Value Date   HGBA1C 11.3 (H) 06/05/2023    Lab Results  Component Value Date   CREATININE 1.00 06/05/2023   BUN 15 06/05/2023   NA 139 06/05/2023   K 4.5 06/05/2023   CL 98 06/05/2023   CO2 24 06/05/2023    Lab Results  Component Value Date   CHOL 141 06/05/2023   HDL 32 (L) 06/05/2023   LDLCALC 77 06/05/2023   TRIG 185 (H) 06/05/2023   CHOLHDL 4.4 06/05/2023    Medications Reviewed Today     Reviewed  by Gabriel Carina, RPH (Pharmacist) on 06/10/23 at 0910  Med List Status: <None>   Medication Order Taking? Sig Documenting Provider Last Dose Status Informant  ACCU-CHEK GUIDE test strip [id number] No TEST STRIPS TWICE A DAY Cox, Kirsten, MD Taking Active   Dapagliflozin Pro-metFORMIN ER ([name]) 06-999 MG TB24 [id number] No Take 1 tablet by mouth daily.  Patient not taking: Reported on 06/05/2023   [provider] Not Taking Active            Med Note Littie Deeds, CHERYL A   Wed May 06, 2023  9:11 AM) sample  Empagliflozin-metFORMIN HCl ER (SYNJARDY XR) 12.01-999 MG TB24 [id number] No Take 1 tablet by mouth 2 (two) times daily. Blane Ohara, MD Taking Active   fenofibrate 160 MG tablet [id number] No TAKE 1 TABLET BY MOUTH EVERY DAY Cox, Kirsten, MD Taking Active   fluticasone (FLONASE) 50 MCG/ACT nasal spray [id number] No SPRAY 2 SPRAYS INTO EACH NOSTRIL EVERY DAY Cox, Kirsten, MD Taking Active   icosapent Ethyl (VASCEPA) 1 g capsule [id number] No TAKE 2 CAPSULES BY MOUTH 2 TIMES DAILY. Cox, Kirsten, MD Taking Active   insulin degludec (TRESIBA FLEXTOUCH) 200 UNIT/ML FlexTouch Pen [id number] No Inject 20 Units into the skin at bedtime.  Patient taking differently: Inject 22 Units into the skin at bedtime.   Cox, Kirsten, MD Taking Active   Insulin Pen Needle 32G X 4 MM MISC [id number] No 1 Needle by Does not apply route daily. Cox, Kirsten, MD Taking Active   levothyroxine (SYNTHROID) 100 MCG tablet [id number] No Take 1 tablet (100 mcg total) by  mouth daily before breakfast. Cox, Kirsten, MD Taking Active   lisinopril (ZESTRIL) 10 MG tablet [id number] No TAKE 1 TABLET BY MOUTH EVERY DAY Cox, Kirsten, MD Taking Active   ondansetron (ZOFRAN) 4 MG tablet [id number] No Take 1 tablet (4 mg total) by mouth every 6 (six) hours as needed for nausea or vomiting. Prosperi, Christian H, PA-C Taking Active   rosuvastatin (CRESTOR) 20 MG tablet [id number] No TAKE 1 TABLET BY MOUTH EVERY DAY Cox, Kirsten, MD  Taking Active   Semaglutide ([name]) 14 MG TABS [id number] No Take 1 tablet by mouth daily. [provider] Taking Active            Med Note Littie Deeds, CHERYL A   Wed May 06, 2023  9:25 AM) Working on PAP              Assessment/Plan:   Diabetes: - Currently uncontrolled, due to gap in medication access, now resolved + increasing insulin temporarily to gain glucose control acutely - Recommend to continue current regimen  - Recommend to check glucose twice daily per current patterns    Follow Up Plan: 2-4 weeks to assess BG values, check on status of patient assistance xigduo  Lynnda Shields, PharmD, BCPS Clinical Pharmacist [facility]

## 2023-06-11 NOTE — Telephone Encounter (Signed)
PAP: Patient has been denied for pt assistance by PAP Companies: [employer] due to patient above income threshold for program

## 2023-06-15 ENCOUNTER — Telehealth: Payer: Self-pay

## 2023-06-15 NOTE — Telephone Encounter (Signed)
-----   Message from Gabriel Carina sent at 06/10/2023  9:16 AM EDT ----- Hi,  Can we check status update of [name] patient assistance via [employer]?  Thanks! Thurston Hole

## 2023-06-15 NOTE — Telephone Encounter (Signed)
PAP: Patient has been denied for pt assistance by PAP Companies: [employer] due to patient above income threshold for program PT INCOME WAS ABOVE THE 300% [employer].   PLEASE BE ADVISED A LETTER IS BEING MAILED TO PT HOME FOR FURTHER DIRECTION FOR RECONSIDERATION  FOR XIGDUO PT CAN CALL [phone] [employer]

## 2023-06-16 NOTE — Telephone Encounter (Signed)
Updated telephone note made by Linward Foster on 06/15/23  Letter being mailed to patient with further direction and reconsideration options.

## 2023-06-24 ENCOUNTER — Other Ambulatory Visit: Payer: PPO | Admitting: Pharmacist

## 2023-06-24 NOTE — Progress Notes (Signed)
06/24/2023 Name: Marah Wiget MRN: [id number] DOB: 14-Nov-1955  Chief Complaint  Patient presents with   Medication Assistance    Diantha Karnisha Lainhart is a 67 y.o. year old female who presented for a telephone visit.   They were referred to the pharmacist by their PCP for assistance in managing diabetes and medication access.    Subjective:  Care Team: Primary Care Provider: Blane Ohara, MD  Medication Access/Adherence  [facility] Pharmacy[facility]  [phone] [place] [place] - [address] [place] [address] Phone: [phone] Fax: [phone]  [facility] [address] [place] [address] Phone: [phone] Fax: [phone]  [facility] [address], [place] - [address] [facility] [place] [address] Phone: [phone] Fax: [phone]    Diabetes:  Current medications: synjardy sample BID (bridge until xigduo PAP arrives, has run out), tresiba 32 units daily (instructed to increase until FBG <150), rybelsus 14mg  daily  Current glucose readings: 144, 150. Now back up to 203 since synjardy sample has run out. 180-200s fasting, A1c uptrend as she had run out of patient assistance medications Using traditional meter; testing 1 times daily   Patient denies hypoglycemic s/sx including dizziness, shakiness, sweating. Patient denies hyperglycemic symptoms including polyuria, polydipsia, polyphagia, nocturia, neuropathy, blurred vision.   Current medication access support: tresiba and rybelsus via novonordisk, xigduo via [employer]   Objective:  Lab Results  Component Value Date   HGBA1C 11.3 (H) 06/05/2023    Lab Results  Component Value Date   CREATININE 1.00 06/05/2023   BUN 15 06/05/2023   NA 139 06/05/2023   K 4.5 06/05/2023   CL 98 06/05/2023   CO2 24 06/05/2023    Lab Results  Component Value Date   CHOL 141 06/05/2023   HDL 32 (L) 06/05/2023   LDLCALC  77 06/05/2023   TRIG 185 (H) 06/05/2023   CHOLHDL 4.4 06/05/2023    Medications Reviewed Today     Reviewed by Gabriel Carina, RPH (Pharmacist) on 06/24/23 at 1629  Med List Status: <None>   Medication Order Taking? Sig Documenting Provider Last Dose Status Informant  ACCU-CHEK GUIDE test strip [id number] No TEST STRIPS TWICE A DAY Cox, Kirsten, MD Taking Active   Dapagliflozin Pro-metFORMIN ER ([name]) 06-999 MG TB24 [id number] No Take 1 tablet by mouth daily.  Patient not taking: Reported on 06/05/2023   [provider] Not Taking Active            Med Note Enid Derry Jun 10, 2023  9:11 AM) Obtaining via patient assistance  Empagliflozin-metFORMIN HCl ER (SYNJARDY XR) 12.01-999 MG TB24 [id number] No Take 1 tablet by mouth 2 (two) times daily. Blane Ohara, MD Taking Active            Med Note Enid Derry Jun 10, 2023  9:11 AM) Taking via sample, until xigduo arrives from patient assistance  fenofibrate 160 MG tablet [id number] No TAKE 1 TABLET BY MOUTH EVERY DAY Cox, Kirsten, MD Taking Active   fluticasone (FLONASE) 50 MCG/ACT nasal spray [id number] No SPRAY 2 SPRAYS INTO EACH NOSTRIL EVERY DAY Cox, Kirsten, MD Taking Active   icosapent Ethyl (VASCEPA) 1 g capsule [id number] No TAKE 2 CAPSULES BY MOUTH 2 TIMES DAILY. Cox, Kirsten, MD Taking Active   insulin degludec (TRESIBA FLEXTOUCH) 200 UNIT/ML FlexTouch Pen [id number] No Inject 20 Units into the skin at bedtime.  Patient  taking differently: Inject 22 Units into the skin at bedtime.   Blane Ohara, MD Taking Active            Med Note Enid Derry Jun 10, 2023  9:10 AM) Taking 28 units as of 06/09/23 review  Insulin Pen Needle 32G X 4 MM MISC [id number] No 1 Needle by Does not apply route daily. Cox, Kirsten, MD Taking Active   levothyroxine (SYNTHROID) 100 MCG tablet [id number] No Take 1 tablet (100 mcg total) by mouth daily before breakfast. Cox, Kirsten, MD Taking Active   lisinopril (ZESTRIL)  10 MG tablet [id number] No TAKE 1 TABLET BY MOUTH EVERY DAY Cox, Kirsten, MD Taking Active   ondansetron (ZOFRAN) 4 MG tablet [id number] No Take 1 tablet (4 mg total) by mouth every 6 (six) hours as needed for nausea or vomiting. Prosperi, Christian H, PA-C Taking Active   rosuvastatin (CRESTOR) 20 MG tablet [id number] No TAKE 1 TABLET BY MOUTH EVERY DAY Cox, Kirsten, MD Taking Active   Semaglutide ([name]) 14 MG TABS [id number] No Take 1 tablet by mouth daily. [provider] Taking Active            Med Note Littie Deeds, CHERYL A   Wed May 06, 2023  9:25 AM) Working on PAP              Assessment/Plan:   Diabetes: - Currently uncontrolled, due to gap in medication access, now resolved + increasing insulin temporarily to gain glucose control acutely - Recommend to continue current regimen, will collaborate with PCP for sample of synjardy - Received denial of [employer] for xigduo PAP due to exceeding income threshold. This appears in error, confirmed with patient they are not above income threshold. Provided patient with PAP contact number to speak with manufacturer about appeal/reconsideration.   - Recommend to check glucose twice daily per current patterns    Follow Up Plan: 1 day - check on status of patient assistance xigduo after patient contacted company, arrange synjardy sample at PCP office.  Lynnda Shields, PharmD, BCPS Clinical Pharmacist [facility]

## 2023-06-25 ENCOUNTER — Other Ambulatory Visit: Payer: PPO | Admitting: Pharmacist

## 2023-06-25 ENCOUNTER — Other Ambulatory Visit: Payer: Self-pay | Admitting: Family Medicine

## 2023-06-25 ENCOUNTER — Telehealth: Payer: Self-pay | Admitting: Family Medicine

## 2023-06-25 MED ORDER — METFORMIN HCL 1000 MG PO TABS
1000.0000 mg | ORAL_TABLET | Freq: Two times a day (BID) | ORAL | 1 refills | Status: DC
Start: 2023-06-25 — End: 2023-11-25

## 2023-06-25 NOTE — Progress Notes (Signed)
06/25/2023 Name: Robin Bonilla MRN: [id number] DOB: [birth year]  Chief Complaint  Patient presents with   Diabetes   Medication Assistance    Robin Bonilla is a 67 y.o. year old female who presented for a telephone visit.   They were referred to the pharmacist by their PCP for assistance in managing diabetes and medication access.    Subjective:  Care Team: Primary Care Provider: Blane Ohara, MD  Medication Access/Adherence  [facility] Pharmacy[facility]  [phone] [place], [place] - [address] [place] [address] Phone: [phone] Fax: [phone]  [facility] [address] [place] [address] Phone: [phone] Fax: [phone]  [facility] [address] [place] - [address] [facility] [place] [address] Phone: [phone] Fax: [phone]    Diabetes:  Current medications:  tresiba 32 units daily (instructed to increase until FBG <140), rybelsus 14mg  daily, metformin 1g BID (recently prescribed by PCP as xigduo PAP denied, and synjardy sample medication ran out)  Current glucose readings: 144, 150, now 203 with synjardy sample recently out.  180-200s fasting, A1c uptrend as she had run out of patient assistance medications Using traditional meter; testing 1 times daily   Patient denies hypoglycemic s/sx including dizziness, shakiness, sweating. Patient denies hyperglycemic symptoms including polyuria, polydipsia, polyphagia, nocturia, neuropathy, blurred vision.   Current medication access support: tresiba and rybelsus via novonordisk, xigduo via [employer]   Objective:  Lab Results  Component Value Date   HGBA1C 11.3 (H) 06/05/2023    Lab Results  Component Value Date   CREATININE 1.00 06/05/2023   BUN 15 06/05/2023   NA 139 06/05/2023   K 4.5 06/05/2023   CL 98 06/05/2023   CO2 24 06/05/2023    Lab Results  Component Value Date   CHOL 141  06/05/2023   HDL 32 (L) 06/05/2023   LDLCALC 77 06/05/2023   TRIG 185 (H) 06/05/2023   CHOLHDL 4.4 06/05/2023    Medications Reviewed Today     Reviewed by Gabriel Carina, RPH (Pharmacist) on 06/25/23 at 2227  Med List Status: <None>   Medication Order Taking? Sig Documenting Provider Last Dose Status Informant  ACCU-CHEK GUIDE test strip [id number] No TEST STRIPS TWICE A Elizebeth Brooking, Kirsten, MD Taking Active   fenofibrate 160 MG tablet [id number] No TAKE 1 TABLET BY MOUTH EVERY DAY Cox, Kirsten, MD Taking Active   fluticasone (FLONASE) 50 MCG/ACT nasal spray [id number] No SPRAY 2 SPRAYS INTO EACH NOSTRIL EVERY DAY Cox, Kirsten, MD Taking Active   icosapent Ethyl (VASCEPA) 1 g capsule [id number] No TAKE 2 CAPSULES BY MOUTH 2 TIMES DAILY. Cox, Kirsten, MD Taking Active   insulin degludec (TRESIBA FLEXTOUCH) 200 UNIT/ML FlexTouch Pen [id number] No Inject 20 Units into the skin at bedtime.  Patient taking differently: Inject 22 Units into the skin at bedtime.   Blane Ohara, MD Taking Active            Med Note Enid Derry Jun 10, 2023  9:10 AM) Taking 28 units as of 06/09/23 review  Insulin Pen Needle 32G X 4 MM MISC [id number] No 1 Needle by Does not apply route daily. Cox, Kirsten, MD Taking Active   levothyroxine (SYNTHROID) 100 MCG tablet [id number] No Take 1 tablet (100 mcg total) by mouth daily before breakfast. Cox, Kirsten, MD Taking Active   lisinopril (ZESTRIL) 10 MG tablet [id number] No TAKE 1 TABLET BY MOUTH EVERY  Abelardo Diesel, MD Taking Active   metFORMIN (GLUCOPHAGE) 1000 MG tablet [id number]  Take 1 tablet (1,000 mg total) by mouth 2 (two) times daily with a meal. Cox, Kirsten, MD  Active   ondansetron (ZOFRAN) 4 MG tablet [id number] No Take 1 tablet (4 mg total) by mouth every 6 (six) hours as needed for nausea or vomiting. Prosperi, Christian H, PA-C Taking Active   rosuvastatin (CRESTOR) 20 MG tablet [id number] No TAKE 1 TABLET BY MOUTH EVERY DAY Cox, Kirsten, MD Taking  Active   Semaglutide ([name]) 14 MG TABS [id number] No Take 1 tablet by mouth daily. [provider] Taking Active            Med Note Littie Deeds, CHERYL A   Wed May 06, 2023  9:25 AM) Working on PAP              Assessment/Plan:   Diabetes: - Currently uncontrolled, due to gap in medication access, increasing insulin temporarily to gain glucose control acutely - Recommend to continue current regimen - Will route dose change request form for novonordisk to PCP office to increase tresiba units, as patient will run out of medication faster with using higher doses than prescribed on the current patient assistance.  - Recommend to check glucose twice daily per current patterns    Follow Up Plan: 2 weeks to check on PAP progress for updated tresiba dosing  Lynnda Shields, PharmD, BCPS Clinical Pharmacist [facility]

## 2023-06-25 NOTE — Telephone Encounter (Signed)
Stop xigduo/synjardy. Start metformin 1000 mg twice daily.  Increase tresiba by 2 U every 3 days until sugars are less than 140. (Max dose 60 U daily)

## 2023-06-29 ENCOUNTER — Telehealth: Payer: Self-pay

## 2023-06-29 NOTE — Telephone Encounter (Signed)
-----   Message from Gabriel Carina sent at 06/25/2023 10:32 PM EDT ----- Hi, Can you please route a dose change request form to PCP office for novonordisk PAP for tresiba, directions "Inject 40-60 units SQ daily" (Max 60 units/day) Thank you! Thurston Hole

## 2023-06-29 NOTE — Telephone Encounter (Signed)
FAXED CHANGE IN DOSE PROVIDER PAGES for TRESIBA FLEXTOUCH(NOVO NORDISK ) TO PROVIDER OFFICE OF KIRSTEN COX.   PLEASE BE ADVISE TO HAVE OFFICE TO SEND DIRECTLY TO COMPANY FOR PROCESSING.

## 2023-06-30 NOTE — Telephone Encounter (Signed)
Please be advised provider  pages for dose increase for TRESIBA [facility] should be sent to [employer] from office ONCE PROVIDER HAS REVIEW AND SIGN.

## 2023-07-02 NOTE — Telephone Encounter (Signed)
Novo Nordisk change request signed by provider and faxed to company with confirmation received.

## 2023-07-08 ENCOUNTER — Other Ambulatory Visit: Payer: PPO | Admitting: Pharmacist

## 2023-07-08 NOTE — Progress Notes (Signed)
07/09/2023 Name: Robin Bonilla MRN: [id number] DOB: May 12, 1956  Chief Complaint  Patient presents with   Diabetes   Medication Assistance    Robin Bonilla is a 67 y.o. year old female who presented for a telephone visit.   They were referred to the pharmacist by their PCP for assistance in managing diabetes and medication access.    Subjective:  Care Team: Primary Care Provider: Blane Ohara, MD  Medication Access/Adherence  Current Pharmacy:  [id number] [place], [place] - [address] [place] [address] Phone: [phone] Fax: [phone]  [facility] [address] [place] [address] Phone: [phone] Fax: [phone]  [facility] [address] [place] - [address] [facility] [place] [address] Phone: [phone] Fax: [phone]    Diabetes:  Current medications:  tresiba 36 units daily (instructed to increase until FBG <140), rybelsus 14mg  daily, metformin 1g BID  Current glucose readings: 200 fasting 144, 150, now 203 with synjardy sample recently out.  180-200s fasting, A1c uptrend as she had run out of patient assistance medications Using traditional meter; testing 1 times daily   Patient denies hypoglycemic s/sx including dizziness, shakiness, sweating. Patient denies hyperglycemic symptoms including polyuria, polydipsia, polyphagia, nocturia, neuropathy, blurred vision.   Current medication access support: tresiba and rybelsus via novonordisk, xigduo via [place]&Me   Objective:  Lab Results  Component Value Date   HGBA1C 11.3 (H) 06/05/2023    Lab Results  Component Value Date   CREATININE 1.00 06/05/2023   BUN 15 06/05/2023   NA 139 06/05/2023   K 4.5 06/05/2023   CL 98 06/05/2023   CO2 24 06/05/2023    Lab Results  Component Value Date   CHOL 141 06/05/2023   HDL 32 (L) 06/05/2023   LDLCALC 77 06/05/2023   TRIG 185 (H)  06/05/2023   CHOLHDL 4.4 06/05/2023    Medications Reviewed Today     Reviewed by Gabriel Carina, RPH (Pharmacist) on 07/08/23 at 0930  Med List Status: <None>   Medication Order Taking? Sig Documenting Provider Last Dose Status Informant  ACCU-CHEK GUIDE test strip [id number] Yes TEST STRIPS TWICE A Abelardo Diesel, MD Taking Active   fenofibrate 160 MG tablet [id number] Yes TAKE 1 TABLET BY MOUTH EVERY DAY Cox, Kirsten, MD Taking Active   fluticasone (FLONASE) 50 MCG/ACT nasal spray [id number] Yes SPRAY 2 SPRAYS INTO EACH NOSTRIL EVERY DAY Cox, Kirsten, MD Taking Active   icosapent Ethyl (VASCEPA) 1 g capsule [id number] Yes TAKE 2 CAPSULES BY MOUTH 2 TIMES DAILY. Cox, Kirsten, MD Taking Active   insulin degludec (TRESIBA FLEXTOUCH) 200 UNIT/ML FlexTouch Pen [id number] Yes Inject 20 Units into the skin at bedtime.  Patient taking differently: Inject 22 Units into the skin at bedtime.   Blane Ohara, MD Taking Active            Med Note En[place] Derry Jul 08, 2023  9:27 AM) Taking 36 units as of 07/08/23   Insulin Pen Needle 32G X 4 MM MISC [id number] Yes 1 Needle by Does not apply route daily. Cox, Kirsten, MD Taking Active   levothyroxine (SYNTHROID) 100 MCG tablet [id number] Yes Take 1 tablet (100 mcg total) by mouth daily before breakfast. Cox, Kirsten, MD Taking Active   lisinopril (ZESTRIL) 10 MG tablet [id number] No TAKE 1 TABLET BY MOUTH EVERY DAY  Patient not taking: Reported on 07/08/2023   Blane Ohara,  MD Not Taking Active   metFORMIN (GLUCOPHAGE) 1000 MG tablet [id number] Yes Take 1 tablet (1,000 mg total) by mouth 2 (two) times daily with a meal. Blane Ohara, MD Taking Active     Discontinued 07/08/23 0930 (Patient Preference)   rosuvastatin (CRESTOR) 20 MG tablet [id number] Yes TAKE 1 TABLET BY MOUTH EVERY DAY Cox, Kirsten, MD Taking Active   Semaglutide ([name]) 14 MG TABS [id number]  Take 1 tablet by mouth daily. [provider]  Active            Med Note  Clide Cliff, [place]ie [place]   Wed Jul 08, 2023  9:30 AM) Obtained via PAP               Assessment/Plan:   Diabetes: - Currently uncontrolled, due to gap in medication access, increasing insulin temporarily to gain glucose control acutely - Recommend to continue current regimen - Dose increase of tresiba has been sent successfully to novonordisk to avoid running out of medication, no changes at this time, continue current regimen - Recommend to check glucose twice daily per current patterns   Follow Up Plan: 1-2 month for 2025 novonordisk renewal  Lynnda Shields, PharmD, BCPS Clinical Pharmacist [facility]

## 2023-08-06 ENCOUNTER — Other Ambulatory Visit: Payer: Self-pay

## 2023-08-06 DIAGNOSIS — E1169 Type 2 diabetes mellitus with other specified complication: Secondary | ICD-10-CM

## 2023-08-06 NOTE — Telephone Encounter (Signed)
Copied from CRM [id number]. Topic: Clinical - Medication Refill >> Aug 06, 2023  2:01 PM Lorin Glass B wrote: Most Recent Primary Care Visit:  Provider: [facility], KIRSTEN  Department: [facility]  Visit Type: OFFICE VISIT  Date: 06/05/2023  Medication: Insulin Pen   Has the patient contacted their pharmacy? No (Agent: If no, request that the patient contact the pharmacy for the refill. If patient does not wish to contact the pharmacy document the reason why and proceed with request.) (Agent: If yes, when and what did the pharmacy advise?)  Is this the correct pharmacy for this prescription? Yes If no, delete pharmacy and type the correct one.  This is the patient's preferred pharmacy:  [id number] [place], [place] - [address] [place] [address] Phone: [phone] Fax: [phone]  [facility] [address] [place] [address] Phone: [phone] Fax: [phone]  [facility] [address] [place] - [address] [facility] [place] [address] Phone: [phone] Fax: [phone]   Has the prescription been filled recently? Yes  Is the patient out of the medication? Yes  Has the patient been seen for an appointment in the last year OR does the patient have an upcoming appointment? Yes  Can we respond through MyChart? No  Agent: Please be advised that Rx refills may take up to 3 business days. We ask that you follow-up with your pharmacy. >> Aug 06, 2023  3:37 PM CMA Retha Bither A wrote: Oh okay, the original message only said Insulin Pen so just needed clarification. >> Aug 06, 2023  3:31 PM Lorin Glass B wrote: She read it off as this one listed in her current meds-- Insulin Pen Needle 32G X 4 MM  >> Aug 06, 2023  3:24 PM CMA Nakeia Calvi A wrote: Insulin Pen? Did patient specify which or what the name of the insulin pen was?

## 2023-08-07 MED ORDER — INSULIN PEN NEEDLE 32G X 4 MM MISC
1.0000 | Freq: Every day | 3 refills | Status: AC
Start: 2023-08-07 — End: ?
  Filled 2024-04-07: qty 100, 100d supply, fill #0

## 2023-08-12 ENCOUNTER — Telehealth: Payer: Self-pay

## 2023-08-12 NOTE — Telephone Encounter (Signed)
Patient assistance also received for [place].  Tresiba 200 Units  NDC: [id number] Lot: [id number] EXP: 11/26/2025  2 BOXES= 6 PENS

## 2023-08-12 NOTE — Telephone Encounter (Signed)
Patient assistance for [name] 14 mg. Called the patient and spoke with patient's husband and informed him that it is here ready for pick up.   [name] 14 mg #30 tablets per box.  4 boxes received.  Lot: [id number] HQI:[id number] EXP: 12/27/2024

## 2023-08-13 NOTE — Telephone Encounter (Signed)
PT PICKED UP PATIENT ASSISTANCE RYBELUS AND TRESIBA - SIGNED BLUE FORMS LEFT IN Creola Corn, LPN

## 2023-08-19 ENCOUNTER — Ambulatory Visit: Payer: PPO | Admitting: Family Medicine

## 2023-08-19 ENCOUNTER — Encounter: Payer: Self-pay | Admitting: Family Medicine

## 2023-08-19 VITALS — BP 108/66 | HR 89 | Temp 97.6°F | Ht 60.0 in | Wt 174.0 lb

## 2023-08-19 DIAGNOSIS — J302 Other seasonal allergic rhinitis: Secondary | ICD-10-CM | POA: Diagnosis not present

## 2023-08-19 DIAGNOSIS — J0110 Acute frontal sinusitis, unspecified: Secondary | ICD-10-CM | POA: Insufficient documentation

## 2023-08-19 MED ORDER — FLUTICASONE PROPIONATE 50 MCG/ACT NA SUSP
1.0000 | Freq: Every day | NASAL | 1 refills | Status: DC
Start: 2023-08-19 — End: 2023-09-14

## 2023-08-19 MED ORDER — MONTELUKAST SODIUM 10 MG PO TABS
10.0000 mg | ORAL_TABLET | Freq: Every day | ORAL | 3 refills | Status: DC
Start: 2023-08-19 — End: 2023-11-25

## 2023-08-19 MED ORDER — CETIRIZINE HCL 10 MG PO TABS
10.0000 mg | ORAL_TABLET | Freq: Every day | ORAL | 11 refills | Status: DC
Start: 2023-08-19 — End: 2024-05-04

## 2023-08-19 NOTE — Assessment & Plan Note (Signed)
Patient presents with symptoms of sinus pressure, postnasal drip, and bilateral ear pressure and pain for approximately one week. No fever or productive cough. Patient has been taking Tylenol for pain relief. -Provide samples of antihistamine (Zyrtec)  -Provide a new neti pot for sinus irrigation. -If symptoms worsen or persist beyond 10-14 days, consider prescribing antibiotics. Patient to call if fever develops.

## 2023-08-19 NOTE — Assessment & Plan Note (Signed)
Patient reports sneezing and itchy, watery eyes. -Provide Flonase for postnasal drip. - Refill sent to pharmacy -Singulair 10 mg, Take 1 tablet (10 mg total) by mouth at bedtime  -sleep propped up on pillows

## 2023-08-19 NOTE — Progress Notes (Signed)
Acute Office Visit  Subjective:    Patient ID: Robin Bonilla, female    DOB: 1956-07-21, 67 y.o.   MRN: [id number]  Chief Complaint  Patient presents with   URI    HPI: Patient is in today for Upper respiratory symptoms She complains of bilateral ear pressure/pain, congestion, nasal congestion, no  fever, non productive cough, post nasal drip, and sinus pressure. Onset of symptoms was about a week ago and staying constant.She is drinking plenty of fluids.  Tylenol helps some. "Feels like my head is in a tunnel"  The patient presents with a week-long history of headache and sinus pressure, described as feeling 'like I'm in a tunnel.' The headache is located in the frontal region and is not constant. They also report bilateral ear pressure and pain, with a sore in the right ear. They have been experiencing postnasal drip and occasional non-productive cough. They have been sleeping excessively and taking Tylenol for pain relief. They deny fever, chills, night sweats, and have not been tested for COVID or flu. They have not been around anyone sick, except for their husband who has chronic health problems. They have not tried any over-the-counter sinus or allergy medications. They have a history of a similar episode in the past, which was diagnosed as a sinus infection and treated with an antibiotic.  Past Medical History:  Diagnosis Date   Acute sinusitis    Depression    Diabetes mellitus 2005   Dysthymic disorder    Hirsutism    Hyperlipidemia    Rash, skin    Thrombocytopenia (HCC) 09/14/2021   Thyroid disease    Upper respiratory tract infection due to COVID-19 virus 08/27/2022    Past Surgical History:  Procedure Laterality Date   BACK SURGERY     CHOLECYSTECTOMY  05/30/11   PARTIAL HYSTERECTOMY     TONSILLECTOMY     TUBAL LIGATION      Family History  Problem Relation Age of Onset   Breast cancer Mother 3   CAD Father    CAD Maternal Grandmother    Hypertension  Maternal Grandmother    Diabetes Paternal Grandfather    Diabetes Paternal Aunt     Social History   Socioeconomic History   Marital status: Married    Spouse name: Not on file   Number of children: Not on file   Years of education: Not on file   Highest education level: Not on file  Occupational History   Not on file  Tobacco Use   Smoking status: Never   Smokeless tobacco: Never  Substance and Sexual Activity   Alcohol use: No    Alcohol/week: 0.0 standard drinks of alcohol   Drug use: No   Sexual activity: Not on file  Other Topics Concern   Not on file  Social History Narrative   Not on file   Social Determinants of Health   Financial Resource Strain: Low Risk  (06/02/2023)   Overall Financial Resource Strain (CARDIA)    Difficulty of Paying Living Expenses: Not hard at all  Food Insecurity: No Food Insecurity (06/02/2023)   Hunger Vital Sign    Worried About Running Out of Food in the Last Year: Never true    Ran Out of Food in the Last Year: Never true  Transportation Needs: No Transportation Needs (12/02/2022)   PRAPARE - Administrator, Civil Service (Medical): No    Lack of Transportation (Non-Medical): No  Physical Activity: Inactive (06/02/2023)  Exercise Vital Sign    Days of Exercise per Week: 0 days    Minutes of Exercise per Session: 0 min  Stress: No Stress Concern Present (06/02/2023)   [employer] of Occupational Health - Occupational Stress Questionnaire    Feeling of Stress : Only a little  Social Connections: Socially Integrated (06/02/2023)   Social Connection and Isolation Panel [NHANES]    Frequency of Communication with Friends and Family: More than three times a week    Frequency of Social Gatherings with Friends and Family: More than three times a week    Attends Religious Services: More than 4 times per year    Active Member of [employer] or Organizations: Yes    Attends Engineer, structural: More than 4 times per year     Marital Status: Married  Catering manager Violence: Not At Risk (06/02/2023)   Humiliation, Afraid, Rape, and Kick questionnaire    Fear of Current or Ex-Partner: No    Emotionally Abused: No    Physically Abused: No    Sexually Abused: No    Outpatient Medications Prior to Visit  Medication Sig Dispense Refill   ACCU-CHEK GUIDE test strip TEST STRIPS TWICE A DAY 200 each 3   fenofibrate 160 MG tablet TAKE 1 TABLET BY MOUTH EVERY DAY 90 tablet 1   icosapent Ethyl (VASCEPA) 1 g capsule TAKE 2 CAPSULES BY MOUTH 2 TIMES DAILY. 360 capsule 2   insulin degludec (TRESIBA FLEXTOUCH) 200 UNIT/ML FlexTouch Pen Inject 20 Units into the skin at bedtime. (Patient taking differently: Inject 22 Units into the skin at bedtime.) 9 mL 0   Insulin Pen Needle 32G X 4 MM MISC 1 Needle by Does not apply route daily. 100 each 3   levothyroxine (SYNTHROID) 100 MCG tablet Take 1 tablet (100 mcg total) by mouth daily before breakfast. 90 tablet 3   lisinopril (ZESTRIL) 10 MG tablet TAKE 1 TABLET BY MOUTH EVERY DAY (Patient not taking: Reported on 07/08/2023) 90 tablet 0   metFORMIN (GLUCOPHAGE) 1000 MG tablet Take 1 tablet (1,000 mg total) by mouth 2 (two) times daily with a meal. 180 tablet 1   rosuvastatin (CRESTOR) 20 MG tablet TAKE 1 TABLET BY MOUTH EVERY DAY 90 tablet 1   Semaglutide ([name]) 14 MG TABS Take 1 tablet by mouth daily.     fluticasone (FLONASE) 50 MCG/ACT nasal spray SPRAY 2 SPRAYS INTO EACH NOSTRIL EVERY DAY 48 mL 1   No facility-administered medications prior to visit.    Allergies  Allergen Reactions   Lunesta [Eszopiclone] Other (See Comments)    Fatigue.    Penicillins Rash   Simvastatin Other (See Comments)    nightmares     Review of Systems  Constitutional:  Negative for chills, diaphoresis, fatigue and fever.  HENT:  Positive for congestion, ear pain (right ear), postnasal drip, sinus pain (frontal) and sneezing. Negative for rhinorrhea, sinus pressure, sore throat and trouble  swallowing.   Eyes:  Positive for itching.  Respiratory:  Positive for cough. Negative for shortness of breath.   Cardiovascular:  Negative for chest pain.  Gastrointestinal:  Negative for abdominal pain, diarrhea and nausea.  Genitourinary:  Negative for dysuria, frequency and urgency.  Musculoskeletal:  Negative for arthralgias and myalgias.  Neurological:  Positive for headaches. Negative for dizziness.  Psychiatric/Behavioral:  Negative for sleep disturbance.        Objective:        08/19/2023    1:17 PM 06/05/2023    7:55  AM 06/02/2023    1:35 PM  Vitals with BMI  Height 5\' 0"  5\' 0"  5\' 0"   Weight 174 lbs 175 lbs 176 lbs 6 oz  BMI 33.98 34.18 34.45  Systolic 108 110 960  Diastolic 66 60 60  Pulse 89 77 78    No data found.   Physical Exam Constitutional:      General: She is not in acute distress.    Appearance: Normal appearance.  HENT:     Mouth/Throat:     Mouth: Mucous membranes are moist.     Pharynx: No posterior oropharyngeal erythema.  Eyes:     Conjunctiva/sclera: Conjunctivae normal.  Neck:     Vascular: No carotid bruit.  Cardiovascular:     Rate and Rhythm: Normal rate and regular rhythm.     Heart sounds: Normal heart sounds.  Pulmonary:     Effort: Pulmonary effort is normal.     Breath sounds: Normal breath sounds.  Abdominal:     General: Bowel sounds are normal.     Palpations: Abdomen is soft.     Tenderness: There is no abdominal tenderness.  Neurological:     Mental Status: She is alert. Mental status is at baseline.  Psychiatric:        Mood and Affect: Mood normal.        Behavior: Behavior normal.     Health Maintenance Due  Topic Date Due   DTaP/Tdap/Td (1 - Tdap) Never done   Zoster Vaccines- Shingrix (1 of 2) Never done   Colonoscopy  05/01/2022   DEXA SCAN  Never done   COVID-19 Vaccine (1 - 2023-24 season) Never done    There are no preventive care reminders to display for this patient.   Lab Results  Component  Value Date   TSH 2.070 06/05/2023   Lab Results  Component Value Date   WBC 8.4 06/05/2023   HGB 14.7 06/05/2023   HCT 45.8 06/05/2023   MCV 81 06/05/2023   PLT 248 06/05/2023   Lab Results  Component Value Date   NA 139 06/05/2023   K 4.5 06/05/2023   CO2 24 06/05/2023   GLUCOSE 192 (H) 06/05/2023   BUN 15 06/05/2023   CREATININE 1.00 06/05/2023   BILITOT 1.1 06/05/2023   ALKPHOS 80 06/05/2023   AST 25 06/05/2023   ALT 25 06/05/2023   PROT 7.4 06/05/2023   ALBUMIN 4.6 06/05/2023   CALCIUM 10.1 06/05/2023   ANIONGAP 14 03/02/2023   EGFR 63 06/05/2023   GFR 70.16 11/16/2015   Lab Results  Component Value Date   CHOL 141 06/05/2023   Lab Results  Component Value Date   HDL 32 (L) 06/05/2023   Lab Results  Component Value Date   LDLCALC 77 06/05/2023   Lab Results  Component Value Date   TRIG 185 (H) 06/05/2023   Lab Results  Component Value Date   CHOLHDL 4.4 06/05/2023   Lab Results  Component Value Date   HGBA1C 11.3 (H) 06/05/2023       Assessment & Plan:  Acute non-recurrent frontal sinusitis Assessment & Plan: Patient presents with symptoms of sinus pressure, postnasal drip, and bilateral ear pressure and pain for approximately one week. No fever or productive cough. Patient has been taking Tylenol for pain relief. -Provide samples of antihistamine (Zyrtec)  -Provide a new neti pot for sinus irrigation. -If symptoms worsen or persist beyond 10-14 days, consider prescribing antibiotics. Patient to call if fever develops.  Orders: -  Montelukast Sodium; Take 1 tablet (10 mg total) by mouth at bedtime.  Dispense: 30 tablet; Refill: 3 -     Fluticasone Propionate; Place 1 spray into both nostrils daily.  Dispense: 48 mL; Refill: 1 -     Cetirizine HCl; Take 1 tablet (10 mg total) by mouth daily.  Dispense: 30 tablet; Refill: 11  Seasonal allergic rhinitis, unspecified trigger Assessment & Plan: Patient reports sneezing and itchy, watery  eyes. -Provide Flonase for postnasal drip. - Refill sent to pharmacy -Singulair 10 mg, Take 1 tablet (10 mg total) by mouth at bedtime  -sleep propped up on pillows       Meds ordered this encounter  Medications   montelukast (SINGULAIR) 10 MG tablet    Sig: Take 1 tablet (10 mg total) by mouth at bedtime.    Dispense:  30 tablet    Refill:  3   fluticasone (FLONASE) 50 MCG/ACT nasal spray    Sig: Place 1 spray into both nostrils daily.    Dispense:  48 mL    Refill:  1   cetirizine (ZYRTEC) 10 MG tablet    Sig: Take 1 tablet (10 mg total) by mouth daily.    Dispense:  30 tablet    Refill:  11    No orders of the defined types were placed in this encounter.    Follow-up: Return if symptoms worsen or fail to improve.  An After Visit Summary was printed and given to the patient. Total time spent on today's visit was greater than 30 minutes, including both face-to-face time and nonface-to-face time personally spent on review of chart (labs and imaging), discussing labs and goals, discussing further work-up, treatment options, referrals to specialist if needed, reviewing outside records if pertinent, answering patient's questions, and coordinating care.   Lajuana Matte, FNP [facility] [phone]

## 2023-09-13 NOTE — Progress Notes (Signed)
Subjective:  Patient ID: Robin Bonilla, female    DOB: Sep 05, 1956  Age: 67 y.o. MRN: [id number]  Chief Complaint  Patient presents with   Medical Management of Chronic Issues    HPI   Diabetes:  Complications: hyperlipidemia Glucose checking: fasting daily Glucose logs: 154 - 270 Hypoglycemia: no Most recent A1C: 11.3 Current medications: Metformin 1000 mg twice daily, Continue tresiba 20 U before bed. [name] 14 mg daily. Last Eye Exam:02/10/23 Foot checks: daily  Hyperlipidemia: Current medications: Vascepa 1 g 2 capsules twice daily, fenofibrate 160 mg once daily and rosuvastatin 20 mg nightly.   Hypertension: Complications: diabetes Current medications: Lisinopril 10 mg daily.  Diet: low sugar diet Exercise: not exercise  Hypothyroidism: Taking Levothyroxine 100 mcg daily     08/19/2023    1:20 PM 06/02/2023    1:42 PM 01/29/2023    7:50 AM 10/24/2022    8:48 AM 01/14/2022   11:23 AM  Depression screen PHQ 2/9  Decreased Interest 0 0 0 0 1  Down, Depressed, Hopeless 0 0 0 2 1  PHQ - 2 Score 0 0 0 2 2  Altered sleeping 0   2 2  Tired, decreased energy 0   3 1  Change in appetite 0   0 1  Feeling bad or failure about yourself  0   3 2  Trouble concentrating 0   2 1  Moving slowly or fidgety/restless 0   2 1  Suicidal thoughts 0   1 0  PHQ-9 Score 0   15 10  Difficult doing work/chores Not difficult at all   Very difficult Somewhat difficult        06/02/2023    2:10 PM  Fall Risk   Falls in the past year? 0  Number falls in past yr: 0  Injury with Fall? 0  Risk for fall due to : No Fall Risks  Follow up Falls evaluation completed    Patient Care Team: Blane Ohara, MD as PCP - General (Family Medicine)   Review of Systems  Constitutional:  Negative for chills, fatigue and fever.  HENT:  Negative for congestion, ear pain, rhinorrhea and sore throat.   Respiratory:  Negative for cough and shortness of breath.   Cardiovascular:  Negative for  chest pain.  Gastrointestinal:  Negative for abdominal pain, constipation, diarrhea, nausea and vomiting.  Genitourinary:  Negative for dysuria and urgency.  Musculoskeletal:  Negative for back pain and myalgias.  Neurological:  Negative for dizziness, weakness, light-headedness and headaches.  Psychiatric/Behavioral:  Negative for dysphoric mood. The patient is not nervous/anxious.     Current Outpatient Medications on File Prior to Visit  Medication Sig Dispense Refill   ACCU-CHEK GUIDE test strip TEST STRIPS TWICE A DAY 200 each 3   cetirizine (ZYRTEC) 10 MG tablet Take 1 tablet (10 mg total) by mouth daily. 30 tablet 11   fenofibrate 160 MG tablet TAKE 1 TABLET BY MOUTH EVERY DAY 90 tablet 1   icosapent Ethyl (VASCEPA) 1 g capsule TAKE 2 CAPSULES BY MOUTH 2 TIMES DAILY. 360 capsule 2   insulin degludec (TRESIBA FLEXTOUCH) 200 UNIT/ML FlexTouch Pen Inject 20 Units into the skin at bedtime. (Patient taking differently: Inject 22 Units into the skin at bedtime.) 9 mL 0   Insulin Pen Needle 32G X 4 MM MISC 1 Needle by Does not apply route daily. 100 each 3   lisinopril (ZESTRIL) 10 MG tablet TAKE 1 TABLET BY MOUTH EVERY DAY (Patient not taking:  Reported on 07/08/2023) 90 tablet 0   metFORMIN (GLUCOPHAGE) 1000 MG tablet Take 1 tablet (1,000 mg total) by mouth 2 (two) times daily with a meal. 180 tablet 1   montelukast (SINGULAIR) 10 MG tablet Take 1 tablet (10 mg total) by mouth at bedtime. 30 tablet 3   Semaglutide ([name]) 14 MG TABS Take 1 tablet by mouth daily.     No current facility-administered medications on file prior to visit.   Past Medical History:  Diagnosis Date   Acute sinusitis    Depression    Diabetes mellitus 2005   Dysthymic disorder    Hirsutism    Hyperlipidemia    Rash, skin    Thrombocytopenia (HCC) 09/14/2021   Thyroid disease    Upper respiratory tract infection due to COVID-19 virus 08/27/2022   Past Surgical History:  Procedure Laterality Date   BACK  SURGERY     CHOLECYSTECTOMY  05/30/11   PARTIAL HYSTERECTOMY     TONSILLECTOMY     TUBAL LIGATION      Family History  Problem Relation Age of Onset   Breast cancer Mother 45   CAD Father    CAD Maternal Grandmother    Hypertension Maternal Grandmother    Diabetes Paternal Grandfather    Diabetes Paternal Aunt    Social History   Socioeconomic History   Marital status: Married    Spouse name: Not on file   Number of children: Not on file   Years of education: Not on file   Highest education level: Not on file  Occupational History   Not on file  Tobacco Use   Smoking status: Never   Smokeless tobacco: Never  Substance and Sexual Activity   Alcohol use: No    Alcohol/week: 0.0 standard drinks of alcohol   Drug use: No   Sexual activity: Not on file  Other Topics Concern   Not on file  Social History Narrative   Not on file   Social Drivers of Health   Financial Resource Strain: Low Risk  (06/02/2023)   Overall Financial Resource Strain (CARDIA)    Difficulty of Paying Living Expenses: Not hard at all  Food Insecurity: No Food Insecurity (06/02/2023)   Hunger Vital Sign    Worried About Running Out of Food in the Last Year: Never true    Ran Out of Food in the Last Year: Never true  Transportation Needs: No Transportation Needs (12/02/2022)   PRAPARE - Administrator, Civil Service (Medical): No    Lack of Transportation (Non-Medical): No  Physical Activity: Inactive (06/02/2023)   Exercise Vital Sign    Days of Exercise per Week: 0 days    Minutes of Exercise per Session: 0 min  Stress: No Stress Concern Present (06/02/2023)   [facility] [employer]ational [employer] - Occupational Stress Questionnaire    Feeling of Stress : Only a little  Social Connections: Socially Integrated (06/02/2023)   Social Connection and Isolation Panel [NHANES]    Frequency of Communication with Friends and Family: More than three times a week    Frequency of Social Gatherings  with Friends and Family: More than three times a week    Attends Religious Services: More than 4 times per year    Active Member of [employer] or Organizations: Yes    Attends Engineer, structural: More than 4 times per year    Marital Status: Married    Objective:  BP 108/62   Pulse 79  Temp (!) 97.5 F (36.4 C)   Ht 5' (1.524 m)   Wt 184 lb (83.5 kg)   SpO2 98%   BMI 35.94 kg/m      09/14/2023    8:33 AM 08/19/2023    1:17 PM 06/05/2023    7:55 AM  BP/Weight  Systolic BP 108 108 110  Diastolic BP 62 66 60  Wt. (Lbs) 184 174 175  BMI 35.94 kg/m2 33.98 kg/m2 34.18 kg/m2    Physical Exam Vitals reviewed.  Constitutional:      Appearance: Normal appearance. She is obese.  Neck:     Vascular: No carotid bruit.  Cardiovascular:     Rate and Rhythm: Normal rate and regular rhythm.     Heart sounds: Normal heart sounds.  Pulmonary:     Effort: Pulmonary effort is normal. No respiratory distress.     Breath sounds: Normal breath sounds.  Abdominal:     General: Abdomen is flat. Bowel sounds are normal.     Palpations: Abdomen is soft.     Tenderness: There is no abdominal tenderness.  Neurological:     Mental Status: She is alert and oriented to person, place, and time.  Psychiatric:        Mood and Affect: Mood normal.        Behavior: Behavior normal.     Diabetic Foot Exam - Simple   Simple Foot Form Diabetic Foot exam was performed with the following findings: Yes 09/14/2023  9:36 PM  Visual Inspection No deformities, no ulcerations, no other skin breakdown bilaterally: Yes Sensation Testing See comments: Yes Pulse Check Posterior Tibialis and Dorsalis pulse intact bilaterally: Yes Comments Numbness on feet      Lab Results  Component Value Date   WBC 8.3 09/14/2023   HGB 13.7 09/14/2023   HCT 43.2 09/14/2023   PLT 267 09/14/2023   GLUCOSE 139 (H) 09/14/2023   CHOL 151 09/14/2023   TRIG 117 09/14/2023   HDL 35 (L) 09/14/2023   LDLCALC 95  09/14/2023   ALT 18 09/14/2023   AST 18 09/14/2023   NA 140 09/14/2023   K 4.5 09/14/2023   CL 101 09/14/2023   CREATININE 0.89 09/14/2023   BUN 13 09/14/2023   CO2 22 09/14/2023   TSH 2.070 06/05/2023   HGBA1C 10.1 (H) 09/14/2023   MICROALBUR 80 09/26/2021      Assessment & Plan:    Hypertension associated with diabetes (HCC) Assessment & Plan: Well controlled.  No changes to medicines.  lisinopril 10 mg daily.  Continue to work on eating a healthy diet and exercise.  Labs drawn today.    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  Recommend check sugars fasting daily. Recommend check feet daily. Recommend annual eye exams. Medicines: Metformin 1000 mg BID, continue tresiba 22 U before bed (recommend for Tresiba increase by 2 units every 3 days until sugars are consistently less than 140.  If you develop low sugars, stop changing your insulin and call our office.  [name] 14 mg daily. Continue to work on eating a healthy diet and exercise.  Labs drawn today.     Orders: -     Hemoglobin A1c -     Microalbumin / creatinine urine ratio  Mixed hyperlipidemia  Assessment & Plan: Well controlled.  No changes to medicines. Vascepa 1 g 2 capsules twice daily, fenofibrate 160 mg once daily and rosuvastatin 20 mg nightly.  Continue to work on eating a healthy diet and exercise.  Labs drawn today.    Orders: -     Lipid panel  Encounter for osteoporosis screening in asymptomatic postmenopausal patient -     DG Bone Density; Future     No orders of the defined types were placed in this encounter.   Orders Placed This Encounter  Procedures   DG Bone Density   CBC with Differential/Platelet   Comprehensive  metabolic panel   Lipid panel   Hemoglobin A1c   Microalbumin / creatinine urine ratio     Follow-up: Return in about 6 weeks (around 10/26/2023) for chronic follow up for diabetes. Also needs a 3 month follow up. Clayborn Bigness I Leal-Borjas,acting as a scribe for Blane Ohara, MD.,have documented all relevant documentation on the behalf of Blane Ohara, MD,as directed by  Blane Ohara, MD while in the presence of Blane Ohara, MD.   An After Visit Summary was printed and given to the patient.  I attest that I have reviewed this visit and agree with the plan scribed by my staff.   Blane Ohara, MD [facility] [phone]

## 2023-09-14 ENCOUNTER — Encounter: Payer: Self-pay | Admitting: Family Medicine

## 2023-09-14 ENCOUNTER — Ambulatory Visit (INDEPENDENT_AMBULATORY_CARE_PROVIDER_SITE_OTHER): Payer: PPO | Admitting: Family Medicine

## 2023-09-14 VITALS — BP 108/62 | HR 79 | Temp 97.5°F | Ht 60.0 in | Wt 184.0 lb

## 2023-09-14 DIAGNOSIS — E1121 Type 2 diabetes mellitus with diabetic nephropathy: Secondary | ICD-10-CM | POA: Diagnosis not present

## 2023-09-14 DIAGNOSIS — E1159 Type 2 diabetes mellitus with other circulatory complications: Secondary | ICD-10-CM | POA: Diagnosis not present

## 2023-09-14 DIAGNOSIS — Z13820 Encounter for screening for osteoporosis: Secondary | ICD-10-CM | POA: Diagnosis not present

## 2023-09-14 DIAGNOSIS — E782 Mixed hyperlipidemia: Secondary | ICD-10-CM | POA: Diagnosis not present

## 2023-09-14 DIAGNOSIS — E039 Hypothyroidism, unspecified: Secondary | ICD-10-CM | POA: Diagnosis not present

## 2023-09-14 DIAGNOSIS — I152 Hypertension secondary to endocrine disorders: Secondary | ICD-10-CM | POA: Diagnosis not present

## 2023-09-14 DIAGNOSIS — Z780 Asymptomatic menopausal state: Secondary | ICD-10-CM

## 2023-09-14 NOTE — Patient Instructions (Signed)
VISIT SUMMARY:  During today's visit, we discussed your intermittent sinus or inner ear issues, your diabetes management, and other ongoing health concerns. We reviewed your current medications and made some recommendations to help improve your overall health.  YOUR PLAN:  -UNCONTROLLED DIABETES MELLITUS: Your diabetes is not well controlled, with a recent A1c of 11.3 and blood sugar levels ranging from 154 to 270. We recommend continuing your current medications [name], [name], and Metformin) and checking your blood glucose levels before each meal. We also discussed the possibility of using continuous glucose monitoring to better understand your daily glucose trends. You declined at this time.   I am very concerned about the state of your health due to poorly controlled diabetes. Please keep a log of sugars daily and bring to your next appointment.   -SINUS/ALLERGY SYMPTOMS: You have been experiencing intermittent sinus or inner ear issues. We recommend continuing your current over-the-counter sinus or allergy medication as needed.  -HYPERTENSION: Your blood pressure is being managed with Lisinopril. Please continue taking Lisinopril as previously prescribed.  -HYPERLIPIDEMIA: You are managing your cholesterol levels with Fenofibrate and Vascepa. Please continue taking these medications as previously prescribed.  -THYROID DISORDER: You are taking medication for a thyroid disorder. Please continue taking your thyroid medication as previously prescribed.  -GENERAL HEALTH MAINTENANCE: We discussed the importance of ensuring you have access to your necessary medications and addressing any issues with medication cost or availability. We also encourage you to incorporate regular exercise into your daily routine.  INSTRUCTIONS:  Please schedule an earlier follow-up appointment due to your current state and recent hospitalization. Continue checking your blood glucose levels before each meal and  consider the option of continuous glucose monitoring. Maintain your current medication regimen and address any issues with medication cost or availability.

## 2023-09-15 LAB — CBC WITH DIFFERENTIAL/PLATELET
Basophils Absolute: 0.1 10*3/uL (ref 0.0–0.2)
Basos: 1 %
EOS (ABSOLUTE): 0.3 10*3/uL (ref 0.0–0.4)
Eos: 3 %
Hematocrit: 43.2 % (ref 34.0–46.6)
Hemoglobin: 13.7 g/dL (ref 11.1–15.9)
Immature Grans (Abs): 0 10*3/uL (ref 0.0–0.1)
Immature Granulocytes: 0 %
Lymphocytes Absolute: 3.1 10*3/uL (ref 0.7–3.1)
Lymphs: 38 %
MCH: 26.1 pg — ABNORMAL LOW (ref 26.6–33.0)
MCHC: 31.7 g/dL (ref 31.5–35.7)
MCV: 82 fL (ref 79–97)
Monocytes Absolute: 0.4 10*3/uL (ref 0.1–0.9)
Monocytes: 5 %
Neutrophils Absolute: 4.4 10*3/uL (ref 1.4–7.0)
Neutrophils: 53 %
Platelets: 267 10*3/uL (ref 150–450)
RBC: 5.25 x10E6/uL (ref 3.77–5.28)
RDW: 13.5 % (ref 11.7–15.4)
WBC: 8.3 10*3/uL (ref 3.4–10.8)

## 2023-09-15 LAB — COMPREHENSIVE METABOLIC PANEL
ALT: 18 [IU]/L (ref 0–32)
AST: 18 [IU]/L (ref 0–40)
Albumin: 4.2 g/dL (ref 3.9–4.9)
Alkaline Phosphatase: 69 [IU]/L (ref 44–121)
BUN/Creatinine Ratio: 15 (ref 12–28)
BUN: 13 mg/dL (ref 8–27)
Bilirubin Total: 0.6 mg/dL (ref 0.0–1.2)
CO2: 22 mmol/L (ref 20–29)
Calcium: 9.6 mg/dL (ref 8.7–10.3)
Chloride: 101 mmol/L (ref 96–106)
Creatinine, Ser: 0.89 mg/dL (ref 0.57–1.00)
Globulin, Total: 3.1 g/dL (ref 1.5–4.5)
Glucose: 139 mg/dL — ABNORMAL HIGH (ref 70–99)
Potassium: 4.5 mmol/L (ref 3.5–5.2)
Sodium: 140 mmol/L (ref 134–144)
Total Protein: 7.3 g/dL (ref 6.0–8.5)
eGFR: 72 mL/min/{1.73_m2} (ref >59)

## 2023-09-15 LAB — MICROALBUMIN / CREATININE URINE RATIO
Creatinine, Urine: 177.5 mg/dL
Microalb/Creat Ratio: 21 mg/g{creat} (ref 0–29)
Microalbumin, Urine: 37.2 ug/mL

## 2023-09-15 LAB — LIPID PANEL
Chol/HDL Ratio: 4.3 {ratio} (ref 0.0–4.4)
Cholesterol, Total: 151 mg/dL (ref 100–199)
HDL: 35 mg/dL — ABNORMAL LOW (ref >39)
LDL Chol Calc (NIH): 95 mg/dL (ref 0–99)
Triglycerides: 117 mg/dL (ref 0–149)
VLDL Cholesterol Cal: 21 mg/dL (ref 5–40)

## 2023-09-15 LAB — HEMOGLOBIN A1C
Est. average glucose Bld gHb Est-mCnc: 243 mg/dL
Hgb A1c MFr Bld: 10.1 % — ABNORMAL HIGH (ref 4.8–5.6)

## 2023-09-16 NOTE — Assessment & Plan Note (Signed)
Well controlled.  No changes to medicines. Vascepa 1 g 2 capsules twice daily, fenofibrate 160 mg once daily and rosuvastatin 20 mg nightly.  Continue to work on eating a healthy diet and exercise.  Labs drawn today.   

## 2023-09-16 NOTE — Assessment & Plan Note (Signed)
Previously well controlled Continue Synthroid at current dose  

## 2023-09-16 NOTE — Assessment & Plan Note (Addendum)
Control: Improved, but still poorly controlled.  Recommend check sugars fasting daily. Recommend check feet daily. Recommend annual eye exams. Medicines: Metformin 1000 mg BID, continue tresiba 22 U before bed (recommend for Tresiba increase by 2 units every 3 days until sugars are consistently less than 140.  If you develop low sugars, stop changing your insulin and call our office.  [name] 14 mg daily. Continue to work on eating a healthy diet and exercise.  Labs drawn today.

## 2023-09-16 NOTE — Assessment & Plan Note (Signed)
Well controlled.  No changes to medicines.  lisinopril 10 mg daily.  Continue to work on eating a healthy diet and exercise.  Labs drawn today.

## 2023-09-17 ENCOUNTER — Other Ambulatory Visit: Payer: Self-pay | Admitting: Family Medicine

## 2023-09-17 ENCOUNTER — Other Ambulatory Visit: Payer: Self-pay

## 2023-09-17 DIAGNOSIS — Z1231 Encounter for screening mammogram for malignant neoplasm of breast: Secondary | ICD-10-CM

## 2023-09-17 DIAGNOSIS — I152 Hypertension secondary to endocrine disorders: Secondary | ICD-10-CM

## 2023-09-17 MED ORDER — OMNIPOD 5 G7 INTRO (GEN 5) KIT: 1.0000 | PACK | 0 refills | Status: DC

## 2023-09-17 MED ORDER — OMNIPOD 5 G7 PODS (GEN 5) MISC: 1.0000 | 3 refills | Status: DC

## 2023-10-01 ENCOUNTER — Telehealth: Payer: Self-pay | Admitting: Family Medicine

## 2023-10-01 NOTE — Telephone Encounter (Signed)
   Robin Bonilla has been scheduled for the following appointment:  WHAT: BONE DENSITY WHERE: [facility] DATE: 12/04/2023 TIME: 9:30 AM CHECK-IN  Patient has been made aware.

## 2023-10-03 ENCOUNTER — Other Ambulatory Visit: Payer: Self-pay | Admitting: Family Medicine

## 2023-10-10 ENCOUNTER — Other Ambulatory Visit: Payer: Self-pay | Admitting: Family Medicine

## 2023-10-26 ENCOUNTER — Ambulatory Visit: Payer: PPO | Admitting: Family Medicine

## 2023-11-18 ENCOUNTER — Other Ambulatory Visit: Payer: Self-pay | Admitting: Family Medicine

## 2023-11-24 NOTE — Progress Notes (Unsigned)
 Subjective:  Patient ID: Robin Bonilla, female    DOB: [birth year]  Age: 68 y.o. MRN: [id number]  No chief complaint on file.   HPI        08/19/2023    1:20 PM 06/02/2023    1:42 PM 01/29/2023    7:50 AM 10/24/2022    8:48 AM 01/14/2022   11:23 AM  Depression screen PHQ 2/9  Decreased Interest 0 0 0 0 1  Down, Depressed, Hopeless 0 0 0 2 1  PHQ - 2 Score 0 0 0 2 2  Altered sleeping 0   2 2  Tired, decreased energy 0   3 1  Change in appetite 0   0 1  Feeling bad or failure about yourself  0   3 2  Trouble concentrating 0   2 1  Moving slowly or fidgety/restless 0   2 1  Suicidal thoughts 0   1 0  PHQ-9 Score 0   15 10  Difficult doing work/chores Not difficult at all   Very difficult Somewhat difficult        06/02/2023    2:10 PM  Fall Risk   Falls in the past year? 0  Number falls in past yr: 0  Injury with Fall? 0  Risk for fall due to : No Fall Risks  Follow up Falls evaluation completed    Patient Care Team: Blane Ohara, MD as PCP - General (Family Medicine)   Review of Systems  Current Outpatient Medications on File Prior to Visit  Medication Sig Dispense Refill   Insulin Disposable Pump (OMNIPOD 5 G7 INTRO, GEN 5,) KIT 1 kit by Does not apply route as directed. 1 kit 0   Insulin Disposable Pump (OMNIPOD 5 G7 PODS, GEN 5,) MISC Inject 1 each into the skin as directed. Every 2 days 10 each 3   ACCU-CHEK GUIDE TEST test strip TEST STRIPS TWICE A DAY 200 strip 1   cetirizine (ZYRTEC) 10 MG tablet Take 1 tablet (10 mg total) by mouth daily. 30 tablet 11   fenofibrate 160 MG tablet TAKE 1 TABLET BY MOUTH EVERY DAY 90 tablet 1   icosapent Ethyl (VASCEPA) 1 g capsule TAKE 2 CAPSULES BY MOUTH TWICE A DAY 360 capsule 2   insulin degludec (TRESIBA FLEXTOUCH) 200 UNIT/ML FlexTouch Pen Inject 20 Units into the skin at bedtime. (Patient taking differently: Inject 22 Units into the skin at bedtime.) 9 mL 0   Insulin Pen Needle 32G X 4 MM MISC 1 Needle by Does not  apply route daily. 100 each 3   levothyroxine (SYNTHROID) 100 MCG tablet TAKE 1 TABLET BY MOUTH DAILY BEFORE BREAKFAST. 90 tablet 1   lisinopril (ZESTRIL) 10 MG tablet TAKE 1 TABLET BY MOUTH EVERY DAY (Patient not taking: Reported on 07/08/2023) 90 tablet 0   metFORMIN (GLUCOPHAGE) 1000 MG tablet Take 1 tablet (1,000 mg total) by mouth 2 (two) times daily with a meal. 180 tablet 1   montelukast (SINGULAIR) 10 MG tablet Take 1 tablet (10 mg total) by mouth at bedtime. 30 tablet 3   rosuvastatin (CRESTOR) 20 MG tablet TAKE 1 TABLET BY MOUTH EVERY DAY 90 tablet 1   Semaglutide ([name]) 14 MG TABS Take 1 tablet by mouth daily.     No current facility-administered medications on file prior to visit.   Past Medical History:  Diagnosis Date   Acute sinusitis    Depression    Diabetes mellitus 2005   Dysthymic disorder  Hirsutism    Hyperlipidemia    Rash, skin    Thrombocytopenia (HCC) 09/14/2021   Thyroid disease    Upper respiratory tract infection due to COVID-19 virus 08/27/2022   Past Surgical History:  Procedure Laterality Date   BACK SURGERY     CHOLECYSTECTOMY  05/30/11   PARTIAL HYSTERECTOMY     TONSILLECTOMY     TUBAL LIGATION      Family History  Problem Relation Age of Onset   Breast cancer Mother 52   CAD Father    CAD Maternal Grandmother    Hypertension Maternal Grandmother    Diabetes Paternal Grandfather    Diabetes Paternal Aunt    Social History   Socioeconomic History   Marital status: Married    Spouse name: Not on file   Number of children: Not on file   Years of education: Not on file   Highest education level: Not on file  Occupational History   Not on file  Tobacco Use   Smoking status: Never   Smokeless tobacco: Never  Substance and Sexual Activity   Alcohol use: No    Alcohol/week: 0.0 standard drinks of alcohol   Drug use: No   Sexual activity: Not on file  Other Topics Concern   Not on file  Social History Narrative   Not on file    Social Drivers of Health   Financial Resource Strain: Low Risk  (06/02/2023)   Overall Financial Resource Strain (CARDIA)    Difficulty of Paying Living Expenses: Not hard at all  Food Insecurity: No Food Insecurity (06/02/2023)   Hunger Vital Sign    Worried About Running Out of Food in the Last Year: Never true    Ran Out of Food in the Last Year: Never true  Transportation Needs: No Transportation Needs (12/02/2022)   PRAPARE - Administrator, Civil Service (Medical): No    Lack of Transportation (Non-Medical): No  Physical Activity: Inactive (06/02/2023)   Exercise Vital Sign    Days of Exercise per Week: 0 days    Minutes of Exercise per Session: 0 min  Stress: No Stress Concern Present (06/02/2023)   [employer] of Occupational Health - Occupational Stress Questionnaire    Feeling of Stress : Only a little  Social Connections: Socially Integrated (06/02/2023)   Social Connection and Isolation Panel [NHANES]    Frequency of Communication with Friends and Family: More than three times a week    Frequency of Social Gatherings with Friends and Family: More than three times a week    Attends Religious Services: More than 4 times per year    Active Member of [employer] or Organizations: Yes    Attends Engineer, structural: More than 4 times per year    Marital Status: Married    Objective:  There were no vitals taken for this visit.     09/14/2023    8:33 AM 08/19/2023    1:17 PM 06/05/2023    7:55 AM  BP/Weight  Systolic BP 108 108 110  Diastolic BP 62 66 60  Wt. (Lbs) 184 174 175  BMI 35.94 kg/m2 33.98 kg/m2 34.18 kg/m2    Physical Exam  Diabetic Foot Exam - Simple   No data filed      Lab Results  Component Value Date   WBC 8.3 09/14/2023   HGB 13.7 09/14/2023   HCT 43.2 09/14/2023   PLT 267 09/14/2023   GLUCOSE 139 (H) 09/14/2023   CHOL 151  09/14/2023   TRIG 117 09/14/2023   HDL 35 (L) 09/14/2023   LDLCALC 95 09/14/2023   ALT 18 09/14/2023    AST 18 09/14/2023   NA 140 09/14/2023   K 4.5 09/14/2023   CL 101 09/14/2023   CREATININE 0.89 09/14/2023   BUN 13 09/14/2023   CO2 22 09/14/2023   TSH 2.070 06/05/2023   HGBA1C 10.1 (H) 09/14/2023   MICROALBUR 80 09/26/2021      Assessment & Plan:    There are no diagnoses linked to this encounter.   No orders of the defined types were placed in this encounter.   No orders of the defined types were placed in this encounter.    Follow-up: No follow-ups on file.   I,Marla I Leal-Borjas,acting as a scribe for Blane Ohara, MD.,have documented all relevant documentation on the behalf of Blane Ohara, MD,as directed by  Blane Ohara, MD while in the presence of Blane Ohara, MD.   An After Visit Summary was printed and given to the patient.  Blane Ohara, MD [facility] [phone]

## 2023-11-25 ENCOUNTER — Encounter: Payer: Self-pay | Admitting: Family Medicine

## 2023-11-25 ENCOUNTER — Ambulatory Visit (INDEPENDENT_AMBULATORY_CARE_PROVIDER_SITE_OTHER): Payer: PPO | Admitting: Family Medicine

## 2023-11-25 VITALS — BP 118/60 | HR 89 | Temp 97.6°F | Ht 61.5 in | Wt 187.0 lb

## 2023-11-25 DIAGNOSIS — I152 Hypertension secondary to endocrine disorders: Secondary | ICD-10-CM

## 2023-11-25 DIAGNOSIS — E1159 Type 2 diabetes mellitus with other circulatory complications: Secondary | ICD-10-CM

## 2023-11-25 DIAGNOSIS — J0110 Acute frontal sinusitis, unspecified: Secondary | ICD-10-CM | POA: Diagnosis not present

## 2023-11-25 DIAGNOSIS — Z13820 Encounter for screening for osteoporosis: Secondary | ICD-10-CM | POA: Diagnosis not present

## 2023-11-25 DIAGNOSIS — Z780 Asymptomatic menopausal state: Secondary | ICD-10-CM

## 2023-11-25 DIAGNOSIS — E782 Mixed hyperlipidemia: Secondary | ICD-10-CM

## 2023-11-25 DIAGNOSIS — E1169 Type 2 diabetes mellitus with other specified complication: Secondary | ICD-10-CM | POA: Diagnosis not present

## 2023-11-25 DIAGNOSIS — Z1211 Encounter for screening for malignant neoplasm of colon: Secondary | ICD-10-CM | POA: Diagnosis not present

## 2023-11-25 MED ORDER — RYBELSUS 14 MG PO TABS
1.0000 | ORAL_TABLET | Freq: Every day | ORAL | 1 refills | Status: DC
Start: 2023-11-25 — End: 2024-08-24

## 2023-11-25 MED ORDER — METFORMIN HCL 1000 MG PO TABS: 1000.0000 mg | ORAL_TABLET | Freq: Two times a day (BID) | ORAL | 1 refills | Status: DC

## 2023-11-25 MED ORDER — TRESIBA FLEXTOUCH 200 UNIT/ML ~~LOC~~ SOPN: 38.0000 [IU] | PEN_INJECTOR | Freq: Every day | SUBCUTANEOUS | 0 refills | Status: DC

## 2023-11-25 MED ORDER — MONTELUKAST SODIUM 10 MG PO TABS
10.0000 mg | ORAL_TABLET | Freq: Every day | ORAL | 3 refills | Status: DC
Start: 2023-11-25 — End: 2024-05-04

## 2023-11-25 NOTE — Assessment & Plan Note (Signed)
Well controlled.  No changes to medicines.  lisinopril 10 mg daily.  Continue to work on eating a healthy diet and exercise.  Labs drawn today.

## 2023-11-25 NOTE — Assessment & Plan Note (Signed)
 The current medical regimen is helping;  continue present plan and medications. Continue to increase tresiba from 38 U by 2 U every 3 days until sugars less than 140.

## 2023-11-26 ENCOUNTER — Other Ambulatory Visit: Payer: Self-pay | Admitting: Family Medicine

## 2023-12-01 ENCOUNTER — Telehealth: Payer: Self-pay

## 2023-12-01 DIAGNOSIS — H5203 Hypermetropia, bilateral: Secondary | ICD-10-CM | POA: Diagnosis not present

## 2023-12-01 DIAGNOSIS — Z794 Long term (current) use of insulin: Secondary | ICD-10-CM | POA: Diagnosis not present

## 2023-12-01 DIAGNOSIS — Z7984 Long term (current) use of oral hypoglycemic drugs: Secondary | ICD-10-CM | POA: Diagnosis not present

## 2023-12-01 DIAGNOSIS — H25813 Combined forms of age-related cataract, bilateral: Secondary | ICD-10-CM | POA: Diagnosis not present

## 2023-12-01 DIAGNOSIS — E119 Type 2 diabetes mellitus without complications: Secondary | ICD-10-CM | POA: Diagnosis not present

## 2023-12-01 DIAGNOSIS — H524 Presbyopia: Secondary | ICD-10-CM | POA: Diagnosis not present

## 2023-12-01 DIAGNOSIS — H52223 Regular astigmatism, bilateral: Secondary | ICD-10-CM | POA: Diagnosis not present

## 2023-12-01 LAB — HM DIABETES EYE EXAM

## 2023-12-01 NOTE — Telephone Encounter (Signed)
 Patient aware PAP for 4 boxes rybelsus and 2 boxes tresiba is here for pick up p o number [id number]

## 2023-12-03 NOTE — Telephone Encounter (Signed)
 Patient picked up patient assistance

## 2023-12-13 ENCOUNTER — Emergency Department (HOSPITAL_COMMUNITY)
Admission: EM | Admit: 2023-12-13 | Discharge: 2023-12-14 | Disposition: A | Discharge status: Home / Self Care | Attending: Emergency Medicine | Admitting: Emergency Medicine

## 2023-12-13 DIAGNOSIS — R42 Dizziness and giddiness: Secondary | ICD-10-CM | POA: Diagnosis not present

## 2023-12-13 DIAGNOSIS — R0989 Other specified symptoms and signs involving the circulatory and respiratory systems: Secondary | ICD-10-CM | POA: Diagnosis not present

## 2023-12-13 DIAGNOSIS — Z7984 Long term (current) use of oral hypoglycemic drugs: Secondary | ICD-10-CM | POA: Insufficient documentation

## 2023-12-13 DIAGNOSIS — E119 Type 2 diabetes mellitus without complications: Secondary | ICD-10-CM | POA: Insufficient documentation

## 2023-12-13 DIAGNOSIS — R55 Syncope and collapse: Secondary | ICD-10-CM | POA: Diagnosis not present

## 2023-12-13 DIAGNOSIS — K529 Noninfective gastroenteritis and colitis, unspecified: Secondary | ICD-10-CM | POA: Insufficient documentation

## 2023-12-13 DIAGNOSIS — R110 Nausea: Secondary | ICD-10-CM | POA: Diagnosis present

## 2023-12-13 DIAGNOSIS — I10 Essential (primary) hypertension: Secondary | ICD-10-CM | POA: Insufficient documentation

## 2023-12-13 DIAGNOSIS — D72829 Elevated white blood cell count, unspecified: Secondary | ICD-10-CM | POA: Insufficient documentation

## 2023-12-13 DIAGNOSIS — Z79899 Other long term (current) drug therapy: Secondary | ICD-10-CM | POA: Insufficient documentation

## 2023-12-13 DIAGNOSIS — E669 Obesity, unspecified: Secondary | ICD-10-CM | POA: Insufficient documentation

## 2023-12-13 DIAGNOSIS — Z794 Long term (current) use of insulin: Secondary | ICD-10-CM | POA: Insufficient documentation

## 2023-12-14 ENCOUNTER — Other Ambulatory Visit: Payer: Self-pay

## 2023-12-14 ENCOUNTER — Emergency Department (HOSPITAL_COMMUNITY)

## 2023-12-14 ENCOUNTER — Encounter (HOSPITAL_COMMUNITY): Payer: Self-pay

## 2023-12-14 DIAGNOSIS — R0989 Other specified symptoms and signs involving the circulatory and respiratory systems: Secondary | ICD-10-CM | POA: Diagnosis not present

## 2023-12-14 DIAGNOSIS — R55 Syncope and collapse: Secondary | ICD-10-CM | POA: Diagnosis not present

## 2023-12-14 LAB — CBC WITH DIFFERENTIAL/PLATELET
Abs Immature Granulocytes: 0.06 10*3/uL (ref 0.00–0.07)
Basophils Absolute: 0.1 10*3/uL (ref 0.0–0.1)
Basophils Relative: 1 %
Eosinophils Absolute: 0.7 10*3/uL — ABNORMAL HIGH (ref 0.0–0.5)
Eosinophils Relative: 5 %
HCT: 47.7 % — ABNORMAL HIGH (ref 36.0–46.0)
Hemoglobin: 15.7 g/dL — ABNORMAL HIGH (ref 12.0–15.0)
Immature Granulocytes: 1 %
Lymphocytes Relative: 22 %
Lymphs Abs: 2.6 10*3/uL (ref 0.7–4.0)
MCH: 26.4 pg (ref 26.0–34.0)
MCHC: 32.9 g/dL (ref 30.0–36.0)
MCV: 80.3 fL (ref 80.0–100.0)
Monocytes Absolute: 0.7 10*3/uL (ref 0.1–1.0)
Monocytes Relative: 6 %
Neutro Abs: 7.9 10*3/uL — ABNORMAL HIGH (ref 1.7–7.7)
Neutrophils Relative %: 65 %
Platelets: 243 10*3/uL (ref 150–400)
RBC: 5.94 MIL/uL — ABNORMAL HIGH (ref 3.87–5.11)
RDW: 13.8 % (ref 11.5–15.5)
WBC: 12 10*3/uL — ABNORMAL HIGH (ref 4.0–10.5)
nRBC: 0 % (ref 0.0–0.2)

## 2023-12-14 LAB — URINALYSIS, ROUTINE W REFLEX MICROSCOPIC
Bacteria, UA: NONE SEEN
Bilirubin Urine: NEGATIVE
Glucose, UA: >=500 mg/dL — AB
Hgb urine dipstick: NEGATIVE
Ketones, ur: NEGATIVE mg/dL
Nitrite: NEGATIVE
Protein, ur: NEGATIVE mg/dL
Specific Gravity, Urine: 1.017 (ref 1.005–1.030)
pH: 5 (ref 5.0–8.0)

## 2023-12-14 LAB — BASIC METABOLIC PANEL
Anion gap: 15 (ref 5–15)
BUN: 24 mg/dL — ABNORMAL HIGH (ref 8–23)
CO2: 22 mmol/L (ref 22–32)
Calcium: 9.8 mg/dL (ref 8.9–10.3)
Chloride: 104 mmol/L (ref 98–111)
Creatinine, Ser: 1 mg/dL (ref 0.44–1.00)
GFR, Estimated: >60 mL/min (ref >60)
Glucose, Bld: 306 mg/dL — ABNORMAL HIGH (ref 70–99)
Potassium: 4.1 mmol/L (ref 3.5–5.1)
Sodium: 141 mmol/L (ref 135–145)

## 2023-12-14 LAB — CBG MONITORING, ED: Glucose-Capillary: 296 mg/dL — ABNORMAL HIGH (ref 70–99)

## 2023-12-14 LAB — RESP PANEL BY RT-PCR (RSV, FLU A&B, COVID)  RVPGX2
Influenza A by PCR: NEGATIVE
Influenza B by PCR: NEGATIVE
Resp Syncytial Virus by PCR: NEGATIVE
SARS Coronavirus 2 by RT PCR: NEGATIVE

## 2023-12-14 MED ORDER — ONDANSETRON HCL 4 MG/2ML IJ SOLN
4.0000 mg | Freq: Once | INTRAMUSCULAR | Status: AC
  Administered 2023-12-14: 4 mg via INTRAVENOUS
  Filled 2023-12-14: qty 2

## 2023-12-14 MED ORDER — ONDANSETRON 4 MG PO TBDP: 4.0000 mg | ORAL_TABLET | Freq: Three times a day (TID) | ORAL | 0 refills | Status: DC | PRN

## 2023-12-14 MED ORDER — SODIUM CHLORIDE 0.9 % IV BOLUS
1000.0000 mL | Freq: Once | INTRAVENOUS | Status: AC
  Administered 2023-12-14: 1000 mL via INTRAVENOUS

## 2023-12-14 NOTE — ED Triage Notes (Signed)
 Patient was visiting family here in the ED. Patient stated that she started feeling sick and lightheaded. Patient does not know if she lost consciousness. Patient's CBG 296

## 2023-12-14 NOTE — Discharge Instructions (Addendum)
 You were seen in the ER today for your nausea and diarrhea and lightheadedness.  You likely have a viral illness causing your symptoms.  Your blood work is reassuring as were your vital signs.  Please follow-up in outpatient setting with your primary care doctor.  You may use the prescribed nausea medication as needed and may use over-the-counter antidiarrheal medication such as Pepto-Bismol or Imodium.  Return to the ER with any severe symptoms.

## 2023-12-14 NOTE — ED Provider Notes (Incomplete)
  [place] EMERGENCY DEPARTMENT AT [facility] Provider Note   CSN: [id number] Arrival date & time: 12/13/23  2359     History {Add pertinent medical, surgical, social history, OB history to HPI:1} No chief complaint on file.   Robin Bonilla is a 68 y.o. female.  HPI     Home Medications Prior to Admission medications   Medication Sig Start Date End Date Taking? Authorizing Provider  ACCU-CHEK GUIDE TEST test strip TEST STRIPS TWICE A DAY 11/18/23   Cox, Kirsten, MD  cetirizine (ZYRTEC) 10 MG tablet Take 1 tablet (10 mg total) by mouth daily. 08/19/23   Renne Crigler, FNP  fenofibrate 160 MG tablet TAKE 1 TABLET BY MOUTH EVERY DAY 10/04/23   Cox, Kirsten, MD  icosapent Ethyl (VASCEPA) 1 g capsule TAKE 2 CAPSULES BY MOUTH TWICE A DAY 10/11/23   Cox, Kirsten, MD  insulin degludec (TRESIBA FLEXTOUCH) 200 UNIT/ML FlexTouch Pen Inject 38 Units into the skin at bedtime. 11/25/23   CoxFritzi Mandes, MD  Insulin Pen Needle 32G X 4 MM MISC 1 Needle by Does not apply route daily. 08/07/23   Cox, Fritzi Mandes, MD  levothyroxine (SYNTHROID) 100 MCG tablet TAKE 1 TABLET BY MOUTH DAILY BEFORE BREAKFAST. 09/17/23   Blane Ohara, MD  metFORMIN (GLUCOPHAGE) 1000 MG tablet Take 1 tablet (1,000 mg total) by mouth 2 (two) times daily with a meal. 11/25/23   Cox, Kirsten, MD  montelukast (SINGULAIR) 10 MG tablet Take 1 tablet (10 mg total) by mouth at bedtime. 11/25/23   Cox, Fritzi Mandes, MD  rosuvastatin (CRESTOR) 20 MG tablet TAKE 1 TABLET BY MOUTH EVERY DAY 09/17/23   Cox, Kirsten, MD  Semaglutide ([name]) 14 MG TABS Take 1 tablet (14 mg total) by mouth daily. 11/25/23   Blane Ohara, MD      Allergies    Lunesta [eszopiclone], Penicillins, and Simvastatin    Review of Systems   Review of Systems  Physical Exam Updated Vital Signs There were no vitals taken for this visit. Physical Exam  ED Results / Procedures / Treatments   Labs (all labs ordered are listed, but only abnormal results  are displayed) Labs Reviewed - No data to display  EKG None  Radiology No results found.  Procedures Procedures  {Document cardiac monitor, telemetry assessment procedure when appropriate:1}  Medications Ordered in ED Medications - No data to display  ED Course/ Medical Decision Making/ A&P   {   Click here for ABCD2, HEART and other calculatorsREFRESH Note before signing :1}                              Medical Decision Making  ***  {Document critical care time when appropriate:1} {Document review of labs and clinical decision tools ie heart score, Chads2Vasc2 etc:1}  {Document your independent review of radiology images, and any outside records:1} {Document your discussion with family members, caretakers, and with consultants:1} {Document social determinants of health affecting pt's care:1} {Document your decision making why or why not admission, treatments were needed:1} Final Clinical Impression(s) / ED Diagnoses Final diagnoses:  None    Rx / DC Orders ED Discharge Orders     None

## 2023-12-14 NOTE — ED Provider Notes (Signed)
 [facility] AT [facility] Provider Note   CSN: [id number] Arrival date & time: 12/13/23  2359     History  No chief complaint on file.   Robin Bonilla is a 68 y.o. female who was in the emergency department with her husband who is currently a patient when she called the nurse to the bedside as she was feeling presyncopal.  Patient states that she has been nauseated and lightheaded, several episodes of watery diarrhea today without melena or hematochezia, no vomiting.  I have reviewed her medical records.  Patient is history of interstitial lung disease type 2 diabetes, hypertension.  States her husband had similar symptoms though she felt they were secondary to his underlying medical conditions.  No urinary symptoms today no shortness of breath, no chest pain, no fevers at home. No recent travel outside the country.  HPI     Home Medications Prior to Admission medications   Medication Sig Start Date End Date Taking? Authorizing Provider  ACCU-CHEK GUIDE TEST test strip TEST STRIPS TWICE A DAY 11/18/23   Cox, Kirsten, MD  cetirizine (ZYRTEC) 10 MG tablet Take 1 tablet (10 mg total) by mouth daily. 08/19/23   Renne Crigler, FNP  fenofibrate 160 MG tablet TAKE 1 TABLET BY MOUTH EVERY DAY 10/04/23   Cox, Kirsten, MD  icosapent Ethyl (VASCEPA) 1 g capsule TAKE 2 CAPSULES BY MOUTH TWICE A DAY 10/11/23   Cox, Kirsten, MD  insulin degludec (TRESIBA FLEXTOUCH) 200 UNIT/ML FlexTouch Pen Inject 38 Units into the skin at bedtime. 11/25/23   CoxFritzi Mandes, MD  Insulin Pen Needle 32G X 4 MM MISC 1 Needle by Does not apply route daily. 08/07/23   Cox, Fritzi Mandes, MD  levothyroxine (SYNTHROID) 100 MCG tablet TAKE 1 TABLET BY MOUTH DAILY BEFORE BREAKFAST. 09/17/23   Blane Ohara, MD  metFORMIN (GLUCOPHAGE) 1000 MG tablet Take 1 tablet (1,000 mg total) by mouth 2 (two) times daily with a meal. 11/25/23   Cox, Kirsten, MD  montelukast (SINGULAIR) 10 MG tablet Take 1 tablet (10  mg total) by mouth at bedtime. 11/25/23   Cox, Fritzi Mandes, MD  rosuvastatin (CRESTOR) 20 MG tablet TAKE 1 TABLET BY MOUTH EVERY DAY 09/17/23   Cox, Kirsten, MD  Semaglutide ([name]) 14 MG TABS Take 1 tablet (14 mg total) by mouth daily. 11/25/23   CoxFritzi Mandes, MD      Allergies    Lunesta [eszopiclone], Penicillins, and Simvastatin    Review of Systems   Review of Systems  Constitutional:  Positive for activity change, appetite change, chills and fatigue.  Eyes: Negative.   Gastrointestinal:  Positive for diarrhea and nausea. Negative for abdominal distention, abdominal pain and vomiting.  Genitourinary: Negative.   Neurological:  Positive for light-headedness. Negative for syncope.    Physical Exam Updated Vital Signs There were no vitals taken for this visit. Physical Exam Vitals and nursing note reviewed.  Constitutional:      Appearance: She is obese. She is not toxic-appearing.  HENT:     Head: Normocephalic and atraumatic.     Mouth/Throat:     Mouth: Mucous membranes are moist.     Pharynx: No oropharyngeal exudate or posterior oropharyngeal erythema.  Eyes:     General:        Right eye: No discharge.        Left eye: No discharge.     Conjunctiva/sclera: Conjunctivae normal.  Cardiovascular:     Rate and Rhythm: Normal rate and regular  rhythm.     Pulses: Normal pulses.     Heart sounds: Normal heart sounds. No murmur heard. Pulmonary:     Effort: Pulmonary effort is normal. No respiratory distress.     Breath sounds: Normal breath sounds. No wheezing or rales.  Abdominal:     General: Bowel sounds are increased. There is no distension.     Palpations: Abdomen is soft.     Tenderness: There is no abdominal tenderness. There is no right CVA tenderness, left CVA tenderness, guarding or rebound.  Musculoskeletal:        General: No deformity.     Cervical back: Neck supple.     Right lower leg: No edema.     Left lower leg: No edema.  Skin:    General: Skin is  warm and dry.     Capillary Refill: Capillary refill takes less than 2 seconds.  Neurological:     General: No focal deficit present.     Mental Status: She is alert and oriented to person, place, and time. Mental status is at baseline.  Psychiatric:        Mood and Affect: Mood normal.     ED Results / Procedures / Treatments   Labs (all labs ordered are listed, but only abnormal results are displayed) Labs Reviewed - No data to display  EKG None  Radiology No results found.  Procedures Procedures    Medications Ordered in ED Medications - No data to display  ED Course/ Medical Decision Making/ A&P                                 Medical Decision Making 68 year old female with presyncopal symptoms, nausea and diarrhea times today.     Borderline febrile on intake, vitals otherwise normal.  Cardiopulmonary and abdominal exams are benign with exception of increased bowel sounds.  No rebound or guarding, no CVAT.  The differential diagnosis of diarrhea includes but is not limited to: Viral: norovirus/rotavirus; Bacterial-Campylobacter,Shigella, Salmonella, Escherichia coli, E. coli 0157:H7, Yersinia enterocolitica, Vibrio cholerae, Clostridium difficile. Parasitic- Giardia lamblia, Cryptosporidium,Entamoeba histolytica,Cyclospora, Microsporidium. Toxin- Staphylococcus aureus, Bacillus cereus.  Noninfectious causes include GI Bleed, Appendicitis, Mesenteric Ischemia, Diverticulitis, Adrenal Crisis, Thyroid Storm, Toxicologic exposures, Antibiotic or drug-associated, inflammatory bowel disease.   Amount and/or Complexity of Data Reviewed Labs: ordered.    Details: CBC with leukocytosis of 12, hemoglobin of 15.7, question hemoconcentration given lack of history of elevated hemoglobin in the past.  BMP with elevated BUN to 24, creatinine of 1.  RVP negative, UA with glucosuria, small leukocytes, 11-20 WBCs but no bacteria.  Patient without urinary symptoms. Radiology:  ordered.    Details:   Chest x-ray negative for acute cardiopulmonary disease. ECG/medicine tests:     Details:  EKG with sinus rhythm.   Risk Prescription drug management.   Clinical picture most consistent with viral illness causing gastroenteritis.  Patient feeling improved after IV fluids and antiemetic in the emergency department.  Normal hemodynamics throughout her stay in the ED, clinical concern for emergent underlying condition that would warrant further ED workup and patient management is exceedingly low.  Razia  voiced understanding of her medical evaluation and treatment plan. Each of their questions answered to their expressed satisfaction.  Return precautions were given.  Patient is well-appearing, stable, and was discharged in good condition.  This chart was dictated using voice recognition software, Dragon. Despite the best efforts of this provider to  proofread and correct errors, errors may still occur which can change documentation meaning.         Final Clinical Impression(s) / ED Diagnoses Final diagnoses:  None    Rx / DC Orders ED Discharge Orders     None         Paris Lore, PA-C 12/14/23 0416    Gloris Manchester, MD 12/14/23 [phone]

## 2023-12-15 ENCOUNTER — Telehealth: Payer: Self-pay

## 2023-12-15 NOTE — Transitions of Care (Post Inpatient/ED Visit) (Unsigned)
   12/15/2023  Name: Robin Bonilla MRN: [id number] DOB: 10-19-1955  Today's TOC FU Call Status: Today's TOC FU Call Status:: Unsuccessful Call (1st Attempt) Unsuccessful Call (1st Attempt) Date: 12/15/23  Attempted to reach the patient regarding the most recent Inpatient/ED visit.  Follow Up Plan: Additional outreach attempts will be made to reach the patient to complete the Transitions of Care (Post Inpatient/ED visit) call.   Signature Karena Addison, LPN [facility] Nurse Health Advisor Direct Dial [phone]

## 2023-12-17 NOTE — Transitions of Care (Post Inpatient/ED Visit) (Signed)
 12/17/2023  Name: Robin Bonilla MRN: [id number] DOB: 1956/04/15  Today's TOC FU Call Status: Today's TOC FU Call Status:: Successful TOC FU Call Completed Unsuccessful Call (1st Attempt) Date: 12/15/23 [facility] FU Call Complete Date: 12/17/23 Patient's Name and Date of Birth confirmed.  Transition Care Management Follow-up Telephone Call Date of Discharge: 12/14/23 Discharge Facility: [facility]) Type of Discharge: Emergency Department Reason for ED Visit: Other: (gastritis) How have you been since you were released from the hospital?: Better Any questions or concerns?: No  Items Reviewed: Did you receive and understand the discharge instructions provided?: Yes Medications obtained,verified, and reconciled?: Yes (Medications Reviewed) Any new allergies since your discharge?: No Dietary orders reviewed?: Yes Do you have support at home?: Yes People in Home: spouse  Medications Reviewed Today: Medications Reviewed Today     Reviewed by Karena Addison, LPN (Licensed Practical Nurse) on 12/17/23 at 1549  Med List Status: <None>   Medication Order Taking? Sig Documenting Provider Last Dose Status Informant  ACCU-CHEK GUIDE TEST test strip [id number]  TEST STRIPS TWICE A DAY Cox, Kirsten, MD  Active   cetirizine (ZYRTEC) 10 MG tablet [id number]  Take 1 tablet (10 mg total) by mouth daily. Renne Crigler, FNP  Active   fenofibrate 160 MG tablet [id number]  TAKE 1 TABLET BY MOUTH EVERY DAY Cox, Kirsten, MD  Active   icosapent Ethyl (VASCEPA) 1 g capsule [id number]  TAKE 2 CAPSULES BY MOUTH TWICE A DAY Cox, Kirsten, MD  Active   insulin degludec (TRESIBA FLEXTOUCH) 200 UNIT/ML FlexTouch Pen [id number]  Inject 38 Units into the skin at bedtime. Cox, Kirsten, MD  Active   Insulin Pen Needle 32G X 4 MM MISC [id number]  1 Needle by Does not apply route daily. Cox, Kirsten, MD  Active   levothyroxine (SYNTHROID) 100 MCG tablet [id number]  TAKE 1 TABLET BY MOUTH DAILY BEFORE BREAKFAST.  Cox, Kirsten, MD  Active   metFORMIN (GLUCOPHAGE) 1000 MG tablet [id number]  Take 1 tablet (1,000 mg total) by mouth 2 (two) times daily with a meal. Cox, Kirsten, MD  Active   montelukast (SINGULAIR) 10 MG tablet [id number]  Take 1 tablet (10 mg total) by mouth at bedtime. Cox, Kirsten, MD  Active   ondansetron (ZOFRAN-ODT) 4 MG disintegrating tablet [id number]  Take 1 tablet (4 mg total) by mouth every 8 (eight) hours as needed for nausea or vomiting. Sponseller, Eugene Gavia, PA-C  Active   rosuvastatin (CRESTOR) 20 MG tablet [id number]  TAKE 1 TABLET BY MOUTH EVERY DAY Cox, Kirsten, MD  Active   Semaglutide ([name]) 14 MG TABS [id number]  Take 1 tablet (14 mg total) by mouth daily. Cox, Kirsten, MD  Active             Home Care and Equipment/Supplies: Were Home Health Services Ordered?: NA Any new equipment or medical supplies ordered?: NA  Functional Questionnaire: Do you need assistance with bathing/showering or dressing?: No Do you need assistance with meal preparation?: No Do you need assistance with eating?: No Do you have difficulty maintaining continence: No Do you need assistance with getting out of bed/getting out of a chair/moving?: No Do you have difficulty managing or taking your medications?: No  Follow up appointments reviewed: PCP Follow-up appointment confirmed?: NA Specialist Hospital Follow-up appointment confirmed?: NA Do you need transportation to your follow-up appointment?: No Do you understand care options if your condition(s) worsen?: Yes-patient verbalized understanding    SIGNATURE Karena Addison, LPN [facility] Nurse Health Advisor Direct Dial [phone]

## 2024-01-07 ENCOUNTER — Ambulatory Visit (INDEPENDENT_AMBULATORY_CARE_PROVIDER_SITE_OTHER): Payer: PPO | Admitting: Family Medicine

## 2024-01-07 VITALS — BP 130/86 | HR 95 | Temp 98.0°F | Ht 61.5 in | Wt 183.0 lb

## 2024-01-07 DIAGNOSIS — E1121 Type 2 diabetes mellitus with diabetic nephropathy: Secondary | ICD-10-CM

## 2024-01-07 DIAGNOSIS — Z794 Long term (current) use of insulin: Secondary | ICD-10-CM

## 2024-01-07 DIAGNOSIS — E1165 Type 2 diabetes mellitus with hyperglycemia: Secondary | ICD-10-CM

## 2024-01-07 DIAGNOSIS — E1169 Type 2 diabetes mellitus with other specified complication: Secondary | ICD-10-CM | POA: Diagnosis not present

## 2024-01-07 DIAGNOSIS — E039 Hypothyroidism, unspecified: Secondary | ICD-10-CM | POA: Diagnosis not present

## 2024-01-07 DIAGNOSIS — I10 Essential (primary) hypertension: Secondary | ICD-10-CM | POA: Diagnosis not present

## 2024-01-07 DIAGNOSIS — E1159 Type 2 diabetes mellitus with other circulatory complications: Secondary | ICD-10-CM | POA: Diagnosis not present

## 2024-01-07 DIAGNOSIS — I152 Hypertension secondary to endocrine disorders: Secondary | ICD-10-CM

## 2024-01-07 DIAGNOSIS — E782 Mixed hyperlipidemia: Secondary | ICD-10-CM

## 2024-01-07 MED ORDER — ICOSAPENT ETHYL 1 G PO CAPS: 2.0000 g | ORAL_CAPSULE | Freq: Two times a day (BID) | ORAL | 2 refills | Status: DC

## 2024-01-07 NOTE — Progress Notes (Unsigned)
 Subjective:  Patient ID: Robin Bonilla, female    DOB: 1956-05-08  Age: 68 y.o. MRN: [id number]  Chief Complaint  Patient presents with   Medical Management of Chronic Issues    Discussed the use of AI scribe software for clinical note transcription with the patient, who gave verbal consent to proceed.   Diabetes:  Complications: hyperlipidemia Glucose checking: fasting daily Glucose logs: 160-180 Hypoglycemia: no Most recent A1C: 10.1 Current medications: Metformin 1000 mg twice daily, Continue tresiba 42 U before bed. [name] 14 mg daily.  Last Eye Exam:  02/10/23 Foot checks: daily  Hyperlipidemia: Current medications: Vascepa 1 g 2 capsules twice daily, fenofibrate 160 mg once daily and rosuvastatin 20 mg nightly.    Hypertension: Complications: diabetes Current medications: Lisinopril 10 mg daily.   Diet: low sugar diet Exercise: not exercise   Hypothyroidism: Taking Levothyroxine 100 mcg daily        11/25/2023    1:19 PM 08/19/2023    1:20 PM 06/02/2023    1:42 PM 01/29/2023    7:50 AM 10/24/2022    8:48 AM  Depression screen PHQ 2/9  Decreased Interest 0 0 0 0 0  Down, Depressed, Hopeless 0 0 0 0 2  PHQ - 2 Score 0 0 0 0 2  Altered sleeping 0 0   2  Tired, decreased energy 0 0   3  Change in appetite 0 0   0  Feeling bad or failure about yourself  0 0   3  Trouble concentrating 0 0   2  Moving slowly or fidgety/restless 0 0   2  Suicidal thoughts 0 0   1  PHQ-9 Score 0 0   15  Difficult doing work/chores Not difficult at all Not difficult at all   Very difficult        06/02/2023    2:10 PM  Fall Risk   Falls in the past year? 0  Number falls in past yr: 0  Injury with Fall? 0  Risk for fall due to : No Fall Risks  Follow up Falls evaluation completed      01/29/2023    7:50 AM 10/24/2022    8:51 AM  GAD 7 : Generalized Anxiety Score  Nervous, Anxious, on Edge 0 2  Control/stop worrying 0 3  Worry too much - different things  3   Trouble relaxing  3  Restless  3  Easily annoyed or irritable  3  Afraid - awful might happen  3  Total GAD 7 Score  20  Anxiety Difficulty  Very difficult     Patient Care Team: CoxBurleigh Carp, MD as PCP - General (Family Medicine)   Review of Systems  Constitutional:  Negative for chills, fatigue and fever.  HENT:  Negative for congestion, ear pain, rhinorrhea and sore throat.   Respiratory:  Negative for cough and shortness of breath.   Cardiovascular:  Negative for chest pain.  Gastrointestinal:  Negative for abdominal pain, constipation, diarrhea, nausea and vomiting.  Genitourinary:  Negative for dysuria and urgency.  Musculoskeletal:  Negative for back pain and myalgias.  Neurological:  Negative for dizziness, weakness, light-headedness and headaches.  Psychiatric/Behavioral:  Negative for dysphoric mood. The patient is not nervous/anxious.     Current Outpatient Medications on File Prior to Visit  Medication Sig Dispense Refill   ACCU-CHEK GUIDE TEST test strip TEST STRIPS TWICE A DAY 200 strip 1   cetirizine (ZYRTEC) 10 MG tablet Take 1 tablet (10  mg total) by mouth daily. 30 tablet 11   fenofibrate 160 MG tablet TAKE 1 TABLET BY MOUTH EVERY DAY 90 tablet 1   insulin degludec (TRESIBA FLEXTOUCH) 200 UNIT/ML FlexTouch Pen Inject 38 Units into the skin at bedtime. 18 mL 0   Insulin Pen Needle 32G X 4 MM MISC 1 Needle by Does not apply route daily. 100 each 3   levothyroxine (SYNTHROID) 100 MCG tablet TAKE 1 TABLET BY MOUTH DAILY BEFORE BREAKFAST. 90 tablet 1   metFORMIN (GLUCOPHAGE) 1000 MG tablet Take 1 tablet (1,000 mg total) by mouth 2 (two) times daily with a meal. 180 tablet 1   montelukast (SINGULAIR) 10 MG tablet Take 1 tablet (10 mg total) by mouth at bedtime. 90 tablet 3   ondansetron (ZOFRAN-ODT) 4 MG disintegrating tablet Take 1 tablet (4 mg total) by mouth every 8 (eight) hours as needed for nausea or vomiting. 10 tablet 0   rosuvastatin (CRESTOR) 20 MG tablet  TAKE 1 TABLET BY MOUTH EVERY DAY 90 tablet 1   Semaglutide ([name]) 14 MG TABS Take 1 tablet (14 mg total) by mouth daily. 90 tablet 1   No current facility-administered medications on file prior to visit.   Past Medical History:  Diagnosis Date   Acute sinusitis    Depression    Diabetes mellitus 2005   Dysthymic disorder    Hirsutism    Hyperlipidemia    Rash, skin    Thrombocytopenia (HCC) 09/14/2021   Thyroid disease    Upper respiratory tract infection due to COVID-19 virus 08/27/2022   Past Surgical History:  Procedure Laterality Date   BACK SURGERY     CHOLECYSTECTOMY  05/30/11   PARTIAL HYSTERECTOMY     TONSILLECTOMY     TUBAL LIGATION      Family History  Problem Relation Age of Onset   Breast cancer Mother 31   CAD Father    CAD Maternal Grandmother    Hypertension Maternal Grandmother    Diabetes Paternal Grandfather    Diabetes Paternal Aunt    Social History   Socioeconomic History   Marital status: Married    Spouse name: Not on file   Number of children: Not on file   Years of education: Not on file   Highest education level: Not on file  Occupational History   Not on file  Tobacco Use   Smoking status: Never   Smokeless tobacco: Never  Substance and Sexual Activity   Alcohol use: No    Alcohol/week: 0.0 standard drinks of alcohol   Drug use: No   Sexual activity: Not on file  Other Topics Concern   Not on file  Social History Narrative   Not on file   Social Drivers of Health   Financial Resource Strain: Low Risk  (06/02/2023)   Overall Financial Resource Strain (CARDIA)    Difficulty of Paying Living Expenses: Not hard at all  Food Insecurity: No Food Insecurity (01/07/2024)   Hunger Vital Sign    Worried About Running Out of Food in the Last Year: Never true    Ran Out of Food in the Last Year: Never true  Transportation Needs: No Transportation Needs (01/07/2024)   PRAPARE - Administrator, Civil Service (Medical): No     Lack of Transportation (Non-Medical): No  Physical Activity: Unknown (06/02/2023)   Exercise Vital Sign    Days of Exercise per Week: 0 days    Minutes of Exercise per Session: Not on file  Recent Concern: Physical Activity - Inactive (06/02/2023)   Exercise Vital Sign    Days of Exercise per Week: 0 days    Minutes of Exercise per Session: 0 min  Stress: No Stress Concern Present (06/02/2023)   [employer] of Occupational Health - Occupational Stress Questionnaire    Feeling of Stress : Only a little  Social Connections: Socially Integrated (06/02/2023)   Social Connection and Isolation Panel [NHANES]    Frequency of Communication with Friends and Family: More than three times a week    Frequency of Social Gatherings with Friends and Family: More than three times a week    Attends Religious Services: More than 4 times per year    Active Member of [employer] or Organizations: Yes    Attends Engineer, structural: More than 4 times per year    Marital Status: Married    Objective:  BP 130/86   Pulse 95   Temp 98 F (36.7 C)   Ht 5' 1.5" (1.562 m)   Wt 183 lb (83 kg)   SpO2 98%   BMI 34.02 kg/m      01/07/2024    9:19 AM 12/14/2023    4:30 AM 12/14/2023    4:15 AM  BP/Weight  Systolic BP 130 140 144  Diastolic BP 86 75 78  Wt. (Lbs) 183    BMI 34.02 kg/m2      Physical Exam Vitals reviewed.  Constitutional:      Appearance: Normal appearance. She is obese.  Neck:     Vascular: No carotid bruit.  Cardiovascular:     Rate and Rhythm: Normal rate and regular rhythm.     Heart sounds: Normal heart sounds.  Pulmonary:     Effort: Pulmonary effort is normal. No respiratory distress.     Breath sounds: Normal breath sounds.  Abdominal:     General: Abdomen is flat. Bowel sounds are normal.     Palpations: Abdomen is soft.     Tenderness: There is no abdominal tenderness.  Neurological:     Mental Status: She is alert and oriented to person, place, and time.   Psychiatric:        Mood and Affect: Mood normal.        Behavior: Behavior normal.     Diabetic Foot Exam - Simple   Simple Foot Form Diabetic Foot exam was performed with the following findings: Yes 01/07/2024 10:05 AM  Visual Inspection No deformities, no ulcerations, no other skin breakdown bilaterally: Yes Sensation Testing Intact to touch and monofilament testing bilaterally: Yes Pulse Check Posterior Tibialis and Dorsalis pulse intact bilaterally: Yes Comments      Lab Results  Component Value Date   WBC 9.0 01/07/2024   HGB 15.0 01/07/2024   HCT 45.8 01/07/2024   PLT 270 01/07/2024   GLUCOSE 150 (H) 01/07/2024   CHOL 199 01/07/2024   TRIG 245 (H) 01/07/2024   HDL 31 (L) 01/07/2024   LDLCALC 124 (H) 01/07/2024   ALT 30 01/07/2024   AST 22 01/07/2024   NA 140 01/07/2024   K 4.5 01/07/2024   CL 100 01/07/2024   CREATININE 0.88 01/07/2024   BUN 13 01/07/2024   CO2 22 01/07/2024   TSH 2.070 06/05/2023   HGBA1C 9.4 (H) 01/07/2024   MICROALBUR 80 09/26/2021      Assessment & Plan:  Assessment and Plan       Hypertension associated with diabetes (HCC) Assessment & Plan: Well controlled.  No medicines.  Continue  to work on eating a healthy diet and exercise.  Labs drawn today.    Orders: -     Comprehensive metabolic panel with GFR  Mixed diabetic hyperlipidemia associated with type 2 diabetes mellitus (HCC) Assessment & Plan: The current medical regimen is helping;  continue present plan and medications. Continue tresiba from 38 Units in the skin at bedtime, Metformin 1000 mg BID. Labs drawn today.  Orders: -     CBC with Differential/Platelet -     Hemoglobin A1c -     Microalbumin / creatinine urine ratio  Hypothyroidism (acquired) Assessment & Plan: Previously well controlled Continue Synthroid at current dose     Mixed hyperlipidemia Assessment & Plan: Well controlled.  No changes to medicines. Vascepa 1 g 2 capsules twice  daily, fenofibrate 160 mg once daily and rosuvastatin 20 mg nightly.  Continue to work on eating a healthy diet and exercise.  Labs drawn today.    Orders: -     Lipid panel  Diabetic glomerulopathy (HCC) Assessment & Plan: Control: Improved, but still poorly controlled.  Recommend check sugars fasting daily. Recommend check feet daily. Recommend annual eye exams. Medicines: Metformin 1000 mg BID, continue tresiba 38 U before bed (recommend for Tresiba increase by 2 units every 3 days until sugars are consistently less than 140.  If you develop low sugars, stop changing your insulin and call our office.  Continue to work on eating a healthy diet and exercise.  Labs drawn today.      Other orders -     Icosapent Ethyl; Take 2 capsules (2 g total) by mouth 2 (two) times daily.  Dispense: 360 capsule; Refill: 2     Meds ordered this encounter  Medications   icosapent Ethyl (VASCEPA) 1 g capsule    Sig: Take 2 capsules (2 g total) by mouth 2 (two) times daily.    Dispense:  360 capsule    Refill:  2    Orders Placed This Encounter  Procedures   CBC with Differential/Platelet   Comprehensive metabolic panel with GFR   Lipid panel   Hemoglobin A1c   Microalbumin / creatinine urine ratio     Follow-up: Return in about 3 months (around 04/07/2024) for chronic follow up.   I,Katherina A Bramblett,acting as a scribe for Mercy Stall, MD.,have documented all relevant documentation on the behalf of Mercy Stall, MD,as directed by  Mercy Stall, MD while in the presence of Mercy Stall, MD.   Gladys Lamp I Leal-Borjas,acting as a scribe for Mercy Stall, MD.,have documented all relevant documentation on the behalf of Mercy Stall, MD,as directed by  Mercy Stall, MD while in the presence of Mercy Stall, MD.    An After Visit Summary was printed and given to the patient.  Mercy Stall, MD [facility] [phone]

## 2024-01-08 LAB — CBC WITH DIFFERENTIAL/PLATELET
Basophils Absolute: 0.1 10*3/uL (ref 0.0–0.2)
Basos: 1 %
EOS (ABSOLUTE): 0.8 10*3/uL — ABNORMAL HIGH (ref 0.0–0.4)
Eos: 9 %
Hematocrit: 45.8 % (ref 34.0–46.6)
Hemoglobin: 15 g/dL (ref 11.1–15.9)
Immature Grans (Abs): 0 10*3/uL (ref 0.0–0.1)
Immature Granulocytes: 0 %
Lymphocytes Absolute: 3.5 10*3/uL — ABNORMAL HIGH (ref 0.7–3.1)
Lymphs: 39 %
MCH: 26.6 pg (ref 26.6–33.0)
MCHC: 32.8 g/dL (ref 31.5–35.7)
MCV: 81 fL (ref 79–97)
Monocytes Absolute: 0.5 10*3/uL (ref 0.1–0.9)
Monocytes: 6 %
Neutrophils Absolute: 4.1 10*3/uL (ref 1.4–7.0)
Neutrophils: 45 %
Platelets: 270 10*3/uL (ref 150–450)
RBC: 5.63 x10E6/uL — ABNORMAL HIGH (ref 3.77–5.28)
RDW: 13.9 % (ref 11.7–15.4)
WBC: 9 10*3/uL (ref 3.4–10.8)

## 2024-01-08 LAB — LIPID PANEL
Chol/HDL Ratio: 6.4 ratio — ABNORMAL HIGH (ref 0.0–4.4)
Cholesterol, Total: 199 mg/dL (ref 100–199)
HDL: 31 mg/dL — ABNORMAL LOW (ref >39)
LDL Chol Calc (NIH): 124 mg/dL — ABNORMAL HIGH (ref 0–99)
Triglycerides: 245 mg/dL — ABNORMAL HIGH (ref 0–149)
VLDL Cholesterol Cal: 44 mg/dL — ABNORMAL HIGH (ref 5–40)

## 2024-01-08 LAB — COMPREHENSIVE METABOLIC PANEL WITH GFR
ALT: 30 IU/L (ref 0–32)
AST: 22 IU/L (ref 0–40)
Albumin: 4.7 g/dL (ref 3.9–4.9)
Alkaline Phosphatase: 96 IU/L (ref 44–121)
BUN/Creatinine Ratio: 15 (ref 12–28)
BUN: 13 mg/dL (ref 8–27)
Bilirubin Total: 1.4 mg/dL — ABNORMAL HIGH (ref 0.0–1.2)
CO2: 22 mmol/L (ref 20–29)
Calcium: 9.9 mg/dL (ref 8.7–10.3)
Chloride: 100 mmol/L (ref 96–106)
Creatinine, Ser: 0.88 mg/dL (ref 0.57–1.00)
Globulin, Total: 2.8 g/dL (ref 1.5–4.5)
Glucose: 150 mg/dL — ABNORMAL HIGH (ref 70–99)
Potassium: 4.5 mmol/L (ref 3.5–5.2)
Sodium: 140 mmol/L (ref 134–144)
Total Protein: 7.5 g/dL (ref 6.0–8.5)
eGFR: 72 mL/min/{1.73_m2} (ref >59)

## 2024-01-08 LAB — HEMOGLOBIN A1C
Est. average glucose Bld gHb Est-mCnc: 223 mg/dL
Hgb A1c MFr Bld: 9.4 % — ABNORMAL HIGH (ref 4.8–5.6)

## 2024-01-08 LAB — MICROALBUMIN / CREATININE URINE RATIO
Creatinine, Urine: 255.6 mg/dL
Microalb/Creat Ratio: 12 mg/g{creat} (ref 0–29)
Microalbumin, Urine: 31.3 ug/mL

## 2024-01-10 NOTE — Assessment & Plan Note (Signed)
 Control: Improved, but still poorly controlled.  Recommend check sugars fasting daily. Recommend check feet daily. Recommend annual eye exams. Medicines: Metformin 1000 mg BID, continue tresiba 38 U before bed (recommend for Tresiba increase by 2 units every 3 days until sugars are consistently less than 140.  If you develop low sugars, stop changing your insulin and call our office.  Continue to work on eating a healthy diet and exercise.  Labs drawn today.

## 2024-01-10 NOTE — Assessment & Plan Note (Signed)
 Well controlled.  No medicines.  Continue to work on eating a healthy diet and exercise.  Labs drawn today.

## 2024-01-10 NOTE — Assessment & Plan Note (Signed)
Well controlled.  No changes to medicines. Vascepa 1 g 2 capsules twice daily, fenofibrate 160 mg once daily and rosuvastatin 20 mg nightly.  Continue to work on eating a healthy diet and exercise.  Labs drawn today.   

## 2024-01-10 NOTE — Assessment & Plan Note (Signed)
 Previously well controlled Continue Synthroid at current dose

## 2024-01-10 NOTE — Assessment & Plan Note (Addendum)
 The current medical regimen is helping;  continue present plan and medications. Continue tresiba from 38 Units in the skin at bedtime, Metformin 1000 mg BID. Labs drawn today.

## 2024-01-13 ENCOUNTER — Other Ambulatory Visit: Payer: Self-pay | Admitting: Family Medicine

## 2024-01-13 MED ORDER — OMEGA-3-ACID ETHYL ESTERS 1 G PO CAPS: 2.0000 g | ORAL_CAPSULE | Freq: Two times a day (BID) | ORAL | 1 refills | Status: DC

## 2024-01-14 ENCOUNTER — Encounter: Payer: Self-pay | Admitting: Family Medicine

## 2024-01-18 ENCOUNTER — Other Ambulatory Visit: Payer: Self-pay | Admitting: Family Medicine

## 2024-01-18 DIAGNOSIS — E1121 Type 2 diabetes mellitus with diabetic nephropathy: Secondary | ICD-10-CM

## 2024-01-18 DIAGNOSIS — E782 Mixed hyperlipidemia: Secondary | ICD-10-CM

## 2024-01-20 ENCOUNTER — Ambulatory Visit (HOSPITAL_BASED_OUTPATIENT_CLINIC_OR_DEPARTMENT_OTHER)
Admission: RE | Admit: 2024-01-20 | Discharge: 2024-01-20 | Disposition: A | Discharge status: Home / Self Care | Payer: PPO | Source: Ambulatory Visit | Attending: Family Medicine | Admitting: Family Medicine

## 2024-01-20 ENCOUNTER — Ambulatory Visit (INDEPENDENT_AMBULATORY_CARE_PROVIDER_SITE_OTHER)
Admission: RE | Admit: 2024-01-20 | Discharge: 2024-01-20 | Disposition: A | Discharge status: Home / Self Care | Payer: PPO | Source: Ambulatory Visit | Attending: Family Medicine | Admitting: Family Medicine

## 2024-01-20 DIAGNOSIS — Z780 Asymptomatic menopausal state: Secondary | ICD-10-CM

## 2024-01-20 DIAGNOSIS — Z1231 Encounter for screening mammogram for malignant neoplasm of breast: Secondary | ICD-10-CM

## 2024-01-20 DIAGNOSIS — Z13820 Encounter for screening for osteoporosis: Secondary | ICD-10-CM | POA: Diagnosis not present

## 2024-01-26 ENCOUNTER — Telehealth: Payer: Self-pay

## 2024-01-26 NOTE — Telephone Encounter (Signed)
   01/26/2024  Patient ID: Robin Bonilla, female   DOB: December 21, 1955, 68 y.o.   MRN: [id number]  Contacted patient regarding referral for medication access from Mercy Stall, MD .   Appointment scheduled   Future Appointments  Date Time Provider Department Center  01/27/2024  1:30 PM [facility] None  04/08/2024  8:00 AM Cox, Burleigh Carp, MD [facility] None   Rolando Cliche, PharmD, BCGP Clinical Pharmacist  [phone]

## 2024-01-27 ENCOUNTER — Ambulatory Visit (INDEPENDENT_AMBULATORY_CARE_PROVIDER_SITE_OTHER)

## 2024-01-27 DIAGNOSIS — Z794 Long term (current) use of insulin: Secondary | ICD-10-CM

## 2024-01-27 DIAGNOSIS — E782 Mixed hyperlipidemia: Secondary | ICD-10-CM

## 2024-01-27 DIAGNOSIS — E1165 Type 2 diabetes mellitus with hyperglycemia: Secondary | ICD-10-CM

## 2024-01-27 NOTE — Progress Notes (Signed)
 01/28/2024 Name: Robin Bonilla MRN: [id number] DOB: Feb 28, 1956  Chief Complaint  Patient presents with   Medication Management    Robin Bonilla is a 68 y.o. year old female who was referred for medication management by their primary care provider, Cox, Kirsten, MD. They presented for a face to face visit today.   They were referred to the pharmacist by their PCP for assistance in managing diabetes and medication access    Subjective:  Care Team: Primary Care Provider: Mercy Stall, MD ; Next Scheduled Visit:  Future Appointments  Date Time Provider Department Center  02/11/2024 10:00 AM Rolando Cliche, [facility] None  04/08/2024  8:00 AM Cox, Burleigh Carp, MD [facility] None     Medication Access/Adherence  Current Pharmacy:  [facility] [address] [place] [address] Phone: [phone] Fax: [phone]  [facility] [address], [place] - [address] [facility] [place] [address] Phone: [phone] Fax: [phone]  [facility] [address] [place] [address] Phone: [phone] Fax: [phone]   Patient reports affordability concerns with their medications: Yes  Patient reports access/transportation concerns to their pharmacy: No  Patient reports adherence concerns with their medications:  Yes  cost.   Diabetes:  Current medications:  Medications tried in the past: xigduo  (stopped d/t cost?)  Current glucose readings: 170s-200s.  Patient denies hypoglycemic s/sx including dizziness, shakiness, sweating. Patient denies hyperglycemic symptoms including polyuria, polydipsia, polyphagia, nocturia, neuropathy, blurred vision.  Current meal patterns:   Current physical activity: limited  Current medication access support: currently received patient assistance for rybelsus  14mg , tresiba .    Objective:  Lab Results  Component Value  Date   HGBA1C 9.4 (H) 01/07/2024    Lab Results  Component Value Date   CREATININE 0.88 01/07/2024   BUN 13 01/07/2024   NA 140 01/07/2024   K 4.5 01/07/2024   CL 100 01/07/2024   CO2 22 01/07/2024    Lab Results  Component Value Date   CHOL 199 01/07/2024   HDL 31 (L) 01/07/2024   LDLCALC 124 (H) 01/07/2024   TRIG 245 (H) 01/07/2024   CHOLHDL 6.4 (H) 01/07/2024    Medications Reviewed Today     Reviewed by Rolando Cliche, [facility] (Pharmacist) on 01/28/24 at 0909  Med List Status: <None>   Medication Order Taking? Sig Documenting Provider Last Dose Status Informant  ACCU-CHEK GUIDE TEST test strip [id number]  TEST STRIPS TWICE A DAY Cox, Kirsten, MD  Active   cetirizine  (ZYRTEC ) 10 MG tablet [id number]  Take 1 tablet (10 mg total) by mouth daily. Janece Means, FNP  Active   fenofibrate  160 MG tablet [id number]  TAKE 1 TABLET BY MOUTH EVERY DAY Cox, Kirsten, MD  Active   insulin  degludec (TRESIBA  FLEXTOUCH) 200 UNIT/ML FlexTouch Pen [id number]  Inject 38 Units into the skin at bedtime. Cox, Kirsten, MD  Active   Insulin  Pen Needle 32G X 4 MM MISC [id number]  1 Needle by Does not apply route daily. Cox, Kirsten, MD  Active   levothyroxine  (SYNTHROID ) 100 MCG tablet [id number]  TAKE 1 TABLET BY MOUTH DAILY BEFORE BREAKFAST. Cox, Kirsten, MD  Active   metFORMIN  (GLUCOPHAGE ) 1000 MG tablet [id number]  Take 1 tablet (1,000 mg total) by mouth 2 (two) times daily with a meal. Cox, Kirsten, MD  Active   montelukast  (SINGULAIR ) 10 MG tablet [id number]  Take 1 tablet (10 mg total) by mouth at  bedtime. Cox, Kirsten, MD  Active   omega-3 acid ethyl esters (LOVAZA ) 1 g capsule [id number]  Take 2 capsules (2 g total) by mouth 2 (two) times daily. Cox, Kirsten, MD  Active   ondansetron  (ZOFRAN -ODT) 4 MG disintegrating tablet [id number]  Take 1 tablet (4 mg total) by mouth every 8 (eight) hours as needed for nausea or vomiting. Sponseller, Adelle Agent, PA-C  Active   rosuvastatin  (CRESTOR ) 20 MG tablet  [id number]  TAKE 1 TABLET BY MOUTH EVERY DAY Cox, Kirsten, MD  Active   Semaglutide  ([name] ) 14 MG TABS [id number]  Take 1 tablet (14 mg total) by mouth daily. Cox, Kirsten, MD  Active               Assessment/Plan:   Diabetes: - Currently uncontrolled - Reviewed long term cardiovascular and renal outcomes of uncontrolled blood sugar - Reviewed goal A1c, goal fasting, and goal 2 hour post prandial glucose - Reviewed dietary modifications including low carb diet - Reviewed lifestyle modifications including:  - Recommend to exercise 44min/day  - Patient denies personal or family history of multiple endocrine neoplasia type 2, medullary thyroid  cancer; personal history of pancreatitis or gallbladder disease. - Recommend to check glucose daily, unfortunately at this declines wanting to use a CGM device d/t a friend having a bad experience using one - Meets financial criteria for tresiba , rybelsus  patient assistance. Will collaborate with provider, CPhT, and patient to pursue assistance.  -Plan to discuss SGLT2i at next visit with her in about two weeks.    Medication assistance: Majority of time spent discussing medication access issues to fish oil rx. Currently stopped d/t cost. Thankfully was approved through [employer] today. Pharmacy card at bottom of note. Is [facility].  Follow Up Plan: pharmacist to coordinate sending grant information rx refill to pharmacy. Agreed to discuss diabetes in 2 weeks given recent adjustments made to her insulin .    Future Appointments  Date Time Provider Department Center  02/11/2024 10:00 AM Rolando Cliche, [facility] None  04/08/2024  8:00 AM Cox, Burleigh Carp, MD [facility] None

## 2024-01-28 ENCOUNTER — Other Ambulatory Visit: Payer: Self-pay

## 2024-01-28 ENCOUNTER — Other Ambulatory Visit (HOSPITAL_BASED_OUTPATIENT_CLINIC_OR_DEPARTMENT_OTHER): Payer: Self-pay

## 2024-01-28 MED ORDER — ICOSAPENT ETHYL 1 G PO CAPS
2.0000 g | ORAL_CAPSULE | Freq: Two times a day (BID) | ORAL | 1 refills | Status: DC
Start: 2024-01-28 — End: 2024-08-24
  Filled 2024-01-28 – 2024-02-02 (×3): qty 360, 90d supply, fill #0

## 2024-02-02 ENCOUNTER — Other Ambulatory Visit (HOSPITAL_BASED_OUTPATIENT_CLINIC_OR_DEPARTMENT_OTHER): Payer: Self-pay

## 2024-02-03 ENCOUNTER — Other Ambulatory Visit (HOSPITAL_BASED_OUTPATIENT_CLINIC_OR_DEPARTMENT_OTHER): Payer: Self-pay

## 2024-02-04 ENCOUNTER — Other Ambulatory Visit (HOSPITAL_BASED_OUTPATIENT_CLINIC_OR_DEPARTMENT_OTHER): Payer: Self-pay

## 2024-02-11 ENCOUNTER — Other Ambulatory Visit: Payer: Self-pay

## 2024-02-11 NOTE — Progress Notes (Signed)
 02/11/2024  Patient ID: Albertha Alosa, female   DOB: [birth year], 68 y.o.   MRN: [id number]     02/11/2024 Name: Vaishnavi Kindig MRN: [id number] DOB: [birth year]  Chief Complaint  Patient presents with   Medication Management   Diabetes    Toby Necia Jubb is a 68 y.o. year old female who presented for a telephone visit.   They were referred to the pharmacist by their PCP for assistance in managing diabetes and medication access.    Subjective:  Care Team: Primary Care Provider: Mercy Stall, MD ; Next Scheduled Visit:   Future Appointments  Date Time Provider Department Center  05/04/2024  8:00 AM Cox, Burleigh Carp, MD [facility] None     Medication Access/Adherence  Current Pharmacy:  [facility] [address] [place] [address] Phone: [phone] Fax: [phone]  [facility] [address] [place] - [address] [facility] [place] [address] Phone: [phone] Fax: [phone]  [facility] [address] [place] [address] Phone: [phone] Fax: [phone]   Patient reports affordability concerns with their medications: Yes  Patient reports access/transportation concerns to their pharmacy: No  Patient reports adherence concerns with their medications:  Yes  Cost, insulin  titration   Diabetes:  Current medications: Tresiba  46 units daily (titrate 2 units every 3 days until FBG <140, but not any lower than 80), metformin  1000mg  twice daily  Medications tried in the past: xigduo  (stopped d/t cost?), glimepiride  Current glucose readings: 170s-200s. No change since two weeks ago. Does not test daily.  Patient denies hypoglycemic s/sx including dizziness, shakiness, sweating. Patient denies hyperglycemic symptoms including polyuria, polydipsia, polyphagia, nocturia, neuropathy, blurred vision.  Current meal patterns: inconsistent  meal patterns, not wanting to discuss.   Current physical activity: limited, also not wanting to discuss.   Current medication access support: currently received patient assistance for rybelsus  14mg , tresiba .   Objective:  Lab Results  Component Value Date   HGBA1C 9.4 (H) 01/07/2024    Lab Results  Component Value Date   CREATININE 0.88 01/07/2024   BUN 13 01/07/2024   NA 140 01/07/2024   K 4.5 01/07/2024   CL 100 01/07/2024   CO2 22 01/07/2024    Lab Results  Component Value Date   CHOL 199 01/07/2024   HDL 31 (L) 01/07/2024   LDLCALC 124 (H) 01/07/2024   TRIG 245 (H) 01/07/2024   CHOLHDL 6.4 (H) 01/07/2024    Medications Reviewed Today   Medications were not reviewed in this encounter       Assessment/Plan:   Diabetes: - Currently uncontrolled - Reviewed long term cardiovascular and renal outcomes of uncontrolled blood sugar - Reviewed goal A1c, goal fasting, and goal 2 hour post prandial glucose - Recommend to follow tresiba  insulin  titration, now is taking 46 units daily, states she will go up on the tresiba  to 48 units tonight and then continue to titrate 2 units every three days.   - Patient denies personal or family history of multiple endocrine neoplasia type 2, medullary thyroid  cancer; personal history of pancreatitis or gallbladder disease. - Recommend to check glucose daily as opposed sporadically  - Meets financial criteria for Xigduo  patient assistance program through [employer]. However, was denied previous. Plan to review with her in office 02/17/2024. If applicable, ill collaborate with provider, CPhT, and patient to pursue assistance.     Follow Up Plan: OV 5/21 to review BG,  PAP, LIS application.   Rolando Cliche, PharmD, BCGP Clinical Pharmacist  [phone]

## 2024-02-17 ENCOUNTER — Ambulatory Visit (INDEPENDENT_AMBULATORY_CARE_PROVIDER_SITE_OTHER)

## 2024-02-17 DIAGNOSIS — E1165 Type 2 diabetes mellitus with hyperglycemia: Secondary | ICD-10-CM

## 2024-02-17 DIAGNOSIS — Z794 Long term (current) use of insulin: Secondary | ICD-10-CM

## 2024-02-17 NOTE — Progress Notes (Signed)
 02/17/2024 Name: Robin Bonilla MRN: [id number] DOB: [birth year]  Chief Complaint  Patient presents with   Medication Management   Diabetes    Robin Bonilla is a 68 y.o. year old female who was referred for medication management by their primary care provider, Cox, Kirsten, MD. They presented for a face to face visit today.   They were referred to the pharmacist by their PCP for assistance in managing diabetes and medication access    Subjective:  Care Team: Primary Care Provider: Mercy Stall, MD ; Next Scheduled Visit:   Future Appointments  Date Time Provider Department Center  03/01/2024  2:00 PM Rolando Cliche, [facility] None  05/04/2024  8:00 AM Cox, Burleigh Carp, MD [facility] None    Medication Access/Adherence  Current Pharmacy:  [facility] [address] [place] [address] Phone: [phone] Fax: [phone]  [facility] [address], [place] - [address] [facility] [place] [address] Phone: [phone] Fax: [phone]  [facility] [address] [place] [address] Phone: [phone] Fax: [phone]   Patient reports affordability concerns with their medications: Yes  Patient reports access/transportation concerns to their pharmacy: No  Patient reports adherence concerns with their medications:  Yes     Diabetes:  Current medications:  Tresiba  50units, [name]  14mg ,   Current glucose readings: less than 200. Yesterday just starting doing Tresiba  50 units daily. Ranging 155-200 FBG.  Using glucose meter; testing 1 time daily  Patient denies hypoglycemic s/sx including dizziness, shakiness, sweating. Patient denies hyperglycemic symptoms including polyuria, polydipsia, polyphagia, nocturia, neuropathy, blurred vision.  Current physical activity: no routine exercise, today states she would be comfortable leaving husband to walk  15-30 minutes a couple times a week as a start. Previously would focus diet on greens/lean meat like grilled chicken. D/t husband health issues/other health concerns in the family, died has shifted to convenience.   Current medication access support: Tresiba  50units, [name]  14mg , pending Xigduo .    Objective:  Lab Results  Component Value Date   HGBA1C 9.4 (H) 01/07/2024    Lab Results  Component Value Date   CREATININE 0.88 01/07/2024   BUN 13 01/07/2024   NA 140 01/07/2024   K 4.5 01/07/2024   CL 100 01/07/2024   CO2 22 01/07/2024    Lab Results  Component Value Date   CHOL 199 01/07/2024   HDL 31 (L) 01/07/2024   LDLCALC 124 (H) 01/07/2024   TRIG 245 (H) 01/07/2024   CHOLHDL 6.4 (H) 01/07/2024    Medications Reviewed Today   Medications were not reviewed in this encounter     Assessment/Plan:   Diabetes: - Currently uncontrolled - Reviewed long term cardiovascular and renal outcomes of uncontrolled blood sugar - Reviewed goal A1c, goal fasting, and goal 2 hour post prandial glucose - Reviewed dietary modifications including focus on low carb diet, less processed/convenience foods - Reviewed lifestyle modifications including: starting to exercise routinely, agreeable to walk 15-30 minutes a couple times per week.   - Patient denies personal or family history of multiple endocrine neoplasia type 2, medullary thyroid  cancer; personal history of pancreatitis or gallbladder disease. - Recommend to check glucose daily  - Meets financial criteria for Xigduo  patient assistance program through [employer]. Will collaborate with provider, CPhT, and patient to pursue assistance.   Follow Up Plan: Will coordinate with CPhT to have 1040 sent to [employer] [phone]. We also applied for LIS  today, so patient will inform me once the letter is received.   Rolando Cliche, PharmD, BCGP Clinical Pharmacist  [phone]

## 2024-03-01 ENCOUNTER — Telehealth: Payer: Self-pay

## 2024-03-01 ENCOUNTER — Other Ambulatory Visit: Payer: Self-pay

## 2024-03-01 ENCOUNTER — Other Ambulatory Visit (HOSPITAL_COMMUNITY): Payer: Self-pay

## 2024-03-01 NOTE — Telephone Encounter (Signed)
 PAP: Application for Robin Bonilla has been submitted to [employer] ([employer]), via fax

## 2024-03-01 NOTE — Progress Notes (Signed)
   03/01/2024  Patient ID: Robin Bonilla, female   DOB: [birth year], 68 y.o.   MRN: [id number]  Attempted to call patient for 2pm telephone visit. Called back, pt answered and visit completed.   Future Appointments  Date Time Provider Department Center  05/04/2024  8:00 AM Cox, Burleigh Carp, MD [facility] None    Rolando Cliche, PharmD, BCGP Clinical Pharmacist  [phone]

## 2024-03-01 NOTE — Progress Notes (Signed)
 03/01/2024 Name: Robin Bonilla MRN: [id number] DOB: [birth year]  Chief Complaint  Patient presents with   Medication Management    Robin Bonilla is a 68 y.o. year old female who was referred for medication management by their primary care provider, Cox, Kirsten, MD. They presented for a face to face visit today.   They were referred to the pharmacist by their PCP for assistance in managing diabetes and medication access    Subjective:  Care Team: Primary Care Provider: Mercy Stall, MD ; Next Scheduled Visit:   Future Appointments  Date Time Provider Department Center  03/15/2024  1:00 PM Rolando Cliche, [facility] None  05/04/2024  8:00 AM Cox, Burleigh Carp, MD [facility] None    Medication Access/Adherence  Current Pharmacy:  [facility] [address] [place] [address] Phone: [phone] Fax: [phone]  [facility] [address], [place] - [address] [facility] [place] [address] Phone: [phone] Fax: [phone]  [facility] [address] [place] [address] Phone: [phone] Fax: [phone]   Patient reports affordability concerns with their medications: Yes  Patient reports access/transportation concerns to their pharmacy: No  Patient reports adherence concerns with their medications:  Yes     Diabetes:  Current medications:  Tresiba  50units, [name]  14mg ,   Current glucose readings: less than 200. Yesterday just starting doing Tresiba  50 units daily. Ranging 155-200 FBG.  Using glucose meter; testing 1 time daily  Patient denies hypoglycemic s/sx including dizziness, shakiness, sweating. Patient denies hyperglycemic symptoms including polyuria, polydipsia, polyphagia, nocturia, neuropathy, blurred vision.  Current physical activity: no routine exercise, today states she would be comfortable leaving husband to walk 15-30 minutes  a couple times a week as a start. Previously would focus diet on greens/lean meat like grilled chicken. D/t husband health issues/other health concerns in the family, died has shifted to convenience.   Current medication access support: Tresiba  50units, [name]  14mg , pending Xigduo .    Objective:  Lab Results  Component Value Date   HGBA1C 9.4 (H) 01/07/2024    Lab Results  Component Value Date   CREATININE 0.88 01/07/2024   BUN 13 01/07/2024   NA 140 01/07/2024   K 4.5 01/07/2024   CL 100 01/07/2024   CO2 22 01/07/2024    Lab Results  Component Value Date   CHOL 199 01/07/2024   HDL 31 (L) 01/07/2024   LDLCALC 124 (H) 01/07/2024   TRIG 245 (H) 01/07/2024   CHOLHDL 6.4 (H) 01/07/2024    Medications Reviewed Today   Medications were not reviewed in this encounter     Assessment/Plan:   Diabetes: - Currently uncontrolled - Reviewed long term cardiovascular and renal outcomes of uncontrolled blood sugar - Reviewed goal A1c, goal fasting, and goal 2 hour post prandial glucose - Reviewed dietary modifications including focus on low carb diet, less processed/convenience foods - Reviewed lifestyle modifications including: starting to exercise routinely, agreeable to walk 15-30 minutes a couple times per week.   - Patient denies personal or family history of multiple endocrine neoplasia type 2, medullary thyroid  cancer; personal history of pancreatitis or gallbladder disease. - Recommend to check glucose daily  - Meets financial criteria for Xigduo  patient assistance program through [employer]. Will collaborate with provider, CPhT, and patient to pursue assistance.   Follow Up Plan: Will coordinate with CPhT to have 1040 sent to [employer] [phone]. We also applied for LIS today, so patient  will inform me once the letter is received.   03/01/2024 update: - FBG remains around 200, continues with 50 units of basal insulin  daily. - Has not heard back regarding xigduo  from [employer] PAP  despite tax return being sent out. Will ask CPhT for assistance --- message sent to [facility] today 03/01/2024. - Feeling a bit down lately, feels a weight from deaths in the family/changes in husbands health over the past two years. Not interested in seeing a doctor about this. Wants to pull herself out of it. Plans to be more involved socially with friends and will consider going for walks a couple times/week by herself to get some "me time" - Plan to coordinate with CPhT regarding PAP and check back in with patient in a couple of weeks. Patient might also consider calling doctor as she previously felt this sort of sluggishness when experiencing low thyroid  levels.   Rolando Cliche, PharmD, BCGP Clinical Pharmacist  [phone]

## 2024-03-02 NOTE — Progress Notes (Signed)
 Pharmacy Medication Assistance Program Note    03/02/2024  Patient ID: Robin Bonilla, female   DOB: Nov 01, 1955, 68 y.o.   MRN: [id number]     03/01/2024  Outreach Med[employer]  Manufacturer Med[employer] One [employer]  [employer] Other [employer] therapist Drug  Type of Assistance Manufacturer Assistance  Date Application Received From Patient 03/01/2024  Application Items Received From Patient Application;Proof of Income  Date Application Received From Provider 03/01/2024  Date Application Submitted to Manufacturer 03/01/2024  Method Application Sent to Manufacturer Fax  Patient Assistance Determination Approved  Approval Start Date 03/02/2024  Approval End Date 09/28/2024  Patient Notification Method Telephone Call  Telephone Call Outcome Successful     Signature

## 2024-03-02 NOTE — Telephone Encounter (Signed)
 PAP: Patient assistance application for Xigduo  has been approved by PAP Companies: [employer] from 03/02/2024 to 09/28/2024. Medication should be delivered to PAP Delivery: Home. For further shipping updates, please contact [employer] ([employer]) at [phone]. Patient ID is: [id number]   Approval letter uploaded to media tab   Patient is aware

## 2024-03-15 ENCOUNTER — Other Ambulatory Visit: Payer: Self-pay

## 2024-03-20 ENCOUNTER — Other Ambulatory Visit: Payer: Self-pay | Admitting: Family Medicine

## 2024-03-22 ENCOUNTER — Other Ambulatory Visit: Payer: Self-pay

## 2024-03-22 ENCOUNTER — Telehealth: Payer: Self-pay

## 2024-03-22 NOTE — Progress Notes (Signed)
 03/22/2024 Name: Robin Bonilla MRN: [id number] DOB: 07/19/1956  Chief Complaint  Patient presents with   Medication Management   Diabetes    Alison Ahriana Gunkel is a 68 y.o. year old female who was referred for medication management by their primary care provider, Cox, Kirsten, MD. They presented for a face to face visit today.   They were referred to the pharmacist by their PCP for assistance in managing diabetes and medication access    Subjective:  Care Team: Primary Care Provider: Sherre Clapper, MD ; Next Scheduled Visit:   Future Appointments  Date Time Provider Department Center  04/26/2024 10:00 AM Pandora Cadet, [facility] None  05/04/2024  8:00 AM Cox, Clapper, MD [facility] None    Medication Access/Adherence  Current Pharmacy:  [facility] [address] [place] [address] Phone: [phone] Fax: [phone]  [facility] [address], [place] - [address] [place] [facility] [place] [address] Phone: [phone] Fax: [phone]  [facility] [address] [place] [address] Phone: [phone] Fax: [phone]  [id number] [place], [place] - [address] [place] [address] Phone: [phone] Fax: [phone]   Patient reports affordability concerns with their medications: Yes  Patient reports access/transportation concerns to their pharmacy: No  Patient reports adherence concerns with their medications:  Yes     Diabetes:  Current medications:  Tresiba  50units, [name]  14mg ,   Current glucose readings: less than 200. Yesterday just starting doing Tresiba  50 units daily. Ranging 155-200 FBG.  Using glucose meter; testing 1 time daily  Patient denies hypoglycemic s/sx including dizziness, shakiness, sweating. Patient denies hyperglycemic symptoms including polyuria, polydipsia, polyphagia, nocturia,  neuropathy, blurred vision.  Current physical activity: no routine exercise, today states she would be comfortable leaving husband to walk 15-30 minutes a couple times a week as a start. Previously would focus diet on greens/lean meat like grilled chicken. D/t husband health issues/other health concerns in the family, died has shifted to convenience.   Current medication access support: Tresiba  50units, [name]  14mg , pending Xigduo .    Objective:  Lab Results  Component Value Date   HGBA1C 9.4 (H) 01/07/2024    Lab Results  Component Value Date   CREATININE 0.88 01/07/2024   BUN 13 01/07/2024   NA 140 01/07/2024   K 4.5 01/07/2024   CL 100 01/07/2024   CO2 22 01/07/2024    Lab Results  Component Value Date   CHOL 199 01/07/2024   HDL 31 (L) 01/07/2024   LDLCALC 124 (H) 01/07/2024   TRIG 245 (H) 01/07/2024   CHOLHDL 6.4 (H) 01/07/2024    Medications Reviewed Today   Medications were not reviewed in this encounter     Assessment/Plan:   Diabetes: - Currently uncontrolled - Reviewed long term cardiovascular and renal outcomes of uncontrolled blood sugar - Reviewed goal A1c, goal fasting, and goal 2 hour post prandial glucose - Reviewed dietary modifications including focus on low carb diet, less processed/convenience foods - Reviewed lifestyle modifications including: starting to exercise routinely, agreeable to walk 15-30 minutes a couple times per week.   - Patient denies personal or family history of multiple endocrine neoplasia type 2, medullary thyroid  cancer; personal history of pancreatitis or gallbladder disease. - Recommend to check glucose daily  - Meets financial criteria for Xigduo  patient assistance program through [employer]. Will collaborate with provider, CPhT, and patient to pursue assistance.  Follow Up Plan: Will coordinate with CPhT to have 1040 sent to [employer] [phone]. We also applied for LIS today, so patient will inform me once the letter is  received.   03/01/2024 update: - FBG remains around 200, continues with 50 units of basal insulin  daily. - Has not heard back regarding xigduo  from [employer] PAP despite tax return being sent out. Will ask CPhT for assistance --- message sent to [facility] today 03/01/2024. - Feeling a bit down lately, feels a weight from deaths in the family/changes in husbands health over the past two years. Not interested in seeing a doctor about this. Wants to pull herself out of it. Plans to be more involved socially with friends and will consider going for walks a couple times/week by herself to get some me time - Plan to coordinate with CPhT regarding PAP and check back in with patient in a couple of weeks. Patient might also consider calling doctor as she previously felt this sort of sluggishness when experiencing low thyroid  levels.   03/22/2024 update: - reviewed FBG - reports sugars still running up near 200; xigduo  1000-5mg  BID just started last week through PAP. Now using 52 units of Tresiba  daily. [name]  14mg  daily. Will stop metformin  - reviewed LIS application updates - no mention of LIS approval or denial at this point. - reviewed non-pharm recommendations for DM management - has not been able to walk for exercise, slowly starting to feel more up to it.    Lang Sieve, PharmD, BCGP Clinical Pharmacist  [phone]

## 2024-03-22 NOTE — Progress Notes (Signed)
   03/22/2024  Patient ID: Robin Bonilla, female   DOB: [birth year], 68 y.o.   MRN: [id number]  [facility]  given patient is getting xigduo  1000-5mg  BID through PAP as of last week. Confirmed rx was inactivated.  Lang Sieve, PharmD, BCGP Clinical Pharmacist  [phone]

## 2024-03-23 NOTE — Progress Notes (Signed)
   03/23/2024  Patient ID: Robin Bonilla, female   DOB: 02-14-1956, 68 y.o.   MRN: [id number]  Attempted to contact [employer] when patient was in office today with husband. [employer] kept us  on hold the whole visit and we were not able to make contact regarding LIS update.   [employer] called back and patient's husband was conferenced in, we were made aware that their income exceeded threshold for extra help.   Lang Sieve, PharmD, BCGP Clinical Pharmacist  [phone]

## 2024-04-04 ENCOUNTER — Other Ambulatory Visit: Payer: Self-pay | Admitting: Family Medicine

## 2024-04-04 ENCOUNTER — Other Ambulatory Visit (HOSPITAL_BASED_OUTPATIENT_CLINIC_OR_DEPARTMENT_OTHER): Payer: Self-pay

## 2024-04-04 MED ORDER — LEVOTHYROXINE SODIUM 100 MCG PO TABS
100.0000 ug | ORAL_TABLET | Freq: Every day | ORAL | 0 refills | Status: DC
  Filled 2024-04-04: qty 90, 90d supply, fill #0

## 2024-04-04 NOTE — Telephone Encounter (Signed)
 Copied from CRM [id number]. Topic: Clinical - Medication Refill >> Apr 04, 2024  3:38 PM Carla L wrote: Medication: levothyroxine  (SYNTHROID ) 100 MCG tablet Requesting rx be sent to pharmacy below.   Has the patient contacted their pharmacy? Yes  This is the patient's preferred pharmacy:   [facility] [address] [place] [address] Phone: [phone] Fax: [phone]  Is this the correct pharmacy for this prescription? Yes If no, delete pharmacy and type the correct one.   Has the prescription been filled recently? No  Is the patient out of the medication? No  Has the patient been seen for an appointment in the last year OR does the patient have an upcoming appointment? Yes  Can we respond through MyChart? No  Agent: Please be advised that Rx refills may take up to 3 business days. We ask that you follow-up with your pharmacy.

## 2024-04-07 ENCOUNTER — Other Ambulatory Visit: Payer: Self-pay | Admitting: Family Medicine

## 2024-04-07 ENCOUNTER — Other Ambulatory Visit (HOSPITAL_BASED_OUTPATIENT_CLINIC_OR_DEPARTMENT_OTHER): Payer: Self-pay

## 2024-04-08 ENCOUNTER — Ambulatory Visit: Admitting: Family Medicine

## 2024-04-26 ENCOUNTER — Other Ambulatory Visit: Payer: Self-pay

## 2024-04-26 ENCOUNTER — Telehealth: Payer: Self-pay

## 2024-04-26 NOTE — Progress Notes (Signed)
   04/26/2024  Patient ID: Robin Bonilla, female   DOB: March 29, 1956, 68 y.o.   MRN: [id number]  Attempted patient for 10a outreach 7/29. Multiple ED visits and a hospitilization for husband this month, so lots going on. Would be willing to see me after seeing dr cox 05/04/24 in office. Patient scheduled.   Future Appointments  Date Time Provider Department Center  05/04/2024  8:00 AM Sherre Clapper, MD [facility] None  05/04/2024  9:00 AM [facility] None   Lang Sieve, PharmD, BCGP Clinical Pharmacist  [phone]

## 2024-04-28 ENCOUNTER — Other Ambulatory Visit (HOSPITAL_COMMUNITY): Payer: Self-pay

## 2024-04-28 ENCOUNTER — Telehealth: Payer: Self-pay

## 2024-04-28 NOTE — Telephone Encounter (Signed)
 PAP: Application for [name]  and Tresiba  has been submitted to [employer], online  I have faxed the provider portion of the application to Dr. Abigail Free

## 2024-04-29 NOTE — Telephone Encounter (Signed)
 Received provider portion of [employer] patient assistance and faxed completed form with a copy of insurance card to [employer]

## 2024-05-02 NOTE — Telephone Encounter (Signed)
 PAP: Patient assistance application for [name]  and Tresiba  has been approved by PAP Companies: [employer] from 04/26/2024 to 04/26/2025. Medication should be delivered to PAP Delivery: Provider's office. For further shipping updates, please contact [employer] at [phone]. Patient ID is: waiting on approval letter

## 2024-05-02 NOTE — Progress Notes (Signed)
 Pharmacy Medication Assistance Program Note    05/02/2024  Patient ID: Nirvi Boehler, female   DOB: [birth year], 68 y.o.   MRN: [id number]     03/01/2024 04/28/2024  Outreach Medication One  Initial Outreach Date (Medication One)  04/28/2024  Manufacturer Medication One Nurse, children's Drugs Other Music therapist Drug   [facility]  [name]   Dose of [name]   14mg   Dose of Tresiba   u200  Type of Forensic scientist Assistance  Date Application Sent to Prescriber  04/28/2024  Name of Prescriber  Kirsten Cox  Date Application Received From Patient 03/01/2024 04/28/2024  Application Items Received From Patient Application;Proof of Income   Date Application Received From Provider 03/01/2024 04/29/2024  Date Application Submitted to Manufacturer 03/01/2024 04/29/2024  Method Application Sent to Manufacturer Fax Fax  Patient Assistance Determination Approved Approved  Approval Start Date 03/02/2024 05/02/2024  Approval End Date 09/28/2024 09/28/2024  Patient Notification Method Telephone Call Telephone Call  Telephone Call Outcome Successful Left Voicemail     Signature

## 2024-05-04 ENCOUNTER — Other Ambulatory Visit (HOSPITAL_BASED_OUTPATIENT_CLINIC_OR_DEPARTMENT_OTHER): Payer: Self-pay

## 2024-05-04 ENCOUNTER — Ambulatory Visit (INDEPENDENT_AMBULATORY_CARE_PROVIDER_SITE_OTHER)

## 2024-05-04 ENCOUNTER — Other Ambulatory Visit (HOSPITAL_COMMUNITY): Payer: Self-pay

## 2024-05-04 ENCOUNTER — Encounter: Payer: Self-pay | Admitting: Family Medicine

## 2024-05-04 ENCOUNTER — Ambulatory Visit: Admitting: Family Medicine

## 2024-05-04 VITALS — BP 126/76 | HR 80 | Temp 98.1°F | Ht 61.5 in | Wt 184.0 lb

## 2024-05-04 DIAGNOSIS — Z636 Dependent relative needing care at home: Secondary | ICD-10-CM | POA: Diagnosis not present

## 2024-05-04 DIAGNOSIS — E66811 Obesity, class 1: Secondary | ICD-10-CM

## 2024-05-04 DIAGNOSIS — E1165 Type 2 diabetes mellitus with hyperglycemia: Secondary | ICD-10-CM

## 2024-05-04 DIAGNOSIS — M25562 Pain in left knee: Secondary | ICD-10-CM

## 2024-05-04 DIAGNOSIS — Z794 Long term (current) use of insulin: Secondary | ICD-10-CM

## 2024-05-04 DIAGNOSIS — I152 Hypertension secondary to endocrine disorders: Secondary | ICD-10-CM | POA: Diagnosis not present

## 2024-05-04 DIAGNOSIS — E1169 Type 2 diabetes mellitus with other specified complication: Secondary | ICD-10-CM

## 2024-05-04 DIAGNOSIS — E6609 Other obesity due to excess calories: Secondary | ICD-10-CM

## 2024-05-04 DIAGNOSIS — E039 Hypothyroidism, unspecified: Secondary | ICD-10-CM

## 2024-05-04 DIAGNOSIS — G8929 Other chronic pain: Secondary | ICD-10-CM | POA: Diagnosis not present

## 2024-05-04 DIAGNOSIS — E1159 Type 2 diabetes mellitus with other circulatory complications: Secondary | ICD-10-CM | POA: Diagnosis not present

## 2024-05-04 DIAGNOSIS — Z6834 Body mass index (BMI) 34.0-34.9, adult: Secondary | ICD-10-CM | POA: Diagnosis not present

## 2024-05-04 DIAGNOSIS — E782 Mixed hyperlipidemia: Secondary | ICD-10-CM

## 2024-05-04 LAB — COMPREHENSIVE METABOLIC PANEL WITH GFR
ALT: 30 IU/L (ref 0–32)
AST: 27 IU/L (ref 0–40)
Albumin: 4.4 g/dL (ref 3.9–4.9)
Alkaline Phosphatase: 86 IU/L (ref 44–121)
BUN/Creatinine Ratio: 13 (ref 12–28)
BUN: 11 mg/dL (ref 8–27)
Bilirubin Total: 0.8 mg/dL (ref 0.0–1.2)
CO2: 19 mmol/L — ABNORMAL LOW (ref 20–29)
Calcium: 9.7 mg/dL (ref 8.7–10.3)
Chloride: 104 mmol/L (ref 96–106)
Creatinine, Ser: 0.83 mg/dL (ref 0.57–1.00)
Globulin, Total: 3 g/dL (ref 1.5–4.5)
Glucose: 99 mg/dL (ref 70–99)
Potassium: 4.7 mmol/L (ref 3.5–5.2)
Sodium: 141 mmol/L (ref 134–144)
Total Protein: 7.4 g/dL (ref 6.0–8.5)
eGFR: 78 mL/min/1.73 (ref >59)

## 2024-05-04 LAB — CBC WITH DIFFERENTIAL/PLATELET
Basophils Absolute: 0.1 x10E3/uL (ref 0.0–0.2)
Basos: 1 %
EOS (ABSOLUTE): 0.3 x10E3/uL (ref 0.0–0.4)
Eos: 3 %
Hematocrit: 43.3 % (ref 34.0–46.6)
Hemoglobin: 13.4 g/dL (ref 11.1–15.9)
Immature Grans (Abs): 0 x10E3/uL (ref 0.0–0.1)
Immature Granulocytes: 0 %
Lymphocytes Absolute: 3 x10E3/uL (ref 0.7–3.1)
Lymphs: 38 %
MCH: 25.7 pg — ABNORMAL LOW (ref 26.6–33.0)
MCHC: 30.9 g/dL — ABNORMAL LOW (ref 31.5–35.7)
MCV: 83 fL (ref 79–97)
Monocytes Absolute: 0.6 x10E3/uL (ref 0.1–0.9)
Monocytes: 7 %
Neutrophils Absolute: 4.1 x10E3/uL (ref 1.4–7.0)
Neutrophils: 51 %
Platelets: 237 x10E3/uL (ref 150–450)
RBC: 5.22 x10E6/uL (ref 3.77–5.28)
RDW: 14.6 % (ref 11.7–15.4)
WBC: 8 x10E3/uL (ref 3.4–10.8)

## 2024-05-04 LAB — HEMOGLOBIN A1C
Est. average glucose Bld gHb Est-mCnc: 183 mg/dL
Hgb A1c MFr Bld: 8 % — ABNORMAL HIGH (ref 4.8–5.6)

## 2024-05-04 LAB — LIPID PANEL
Chol/HDL Ratio: 5.4 ratio — ABNORMAL HIGH (ref 0.0–4.4)
Cholesterol, Total: 158 mg/dL (ref 100–199)
HDL: 29 mg/dL — ABNORMAL LOW (ref >39)
LDL Chol Calc (NIH): 107 mg/dL — ABNORMAL HIGH (ref 0–99)
Triglycerides: 120 mg/dL (ref 0–149)
VLDL Cholesterol Cal: 22 mg/dL (ref 5–40)

## 2024-05-04 LAB — TSH: TSH: 4.54 u[IU]/mL — ABNORMAL HIGH (ref 0.450–4.500)

## 2024-05-04 MED ORDER — DAPAGLIFLOZIN PRO-METFORMIN ER 5-1000 MG PO TB24: 2.0000 | ORAL_TABLET | Freq: Every day | ORAL | 3 refills | Status: AC

## 2024-05-04 MED ORDER — ACCU-CHEK GUIDE TEST VI STRP
ORAL_STRIP | 11 refills | Status: AC
Start: 2024-05-04 — End: ?
  Filled 2024-05-04: qty 200, 100d supply, fill #0

## 2024-05-04 MED ORDER — ACCU-CHEK GUIDE TEST VI STRP
ORAL_STRIP | 11 refills | Status: DC
  Filled 2024-05-04: qty 200, 34d supply, fill #0

## 2024-05-04 NOTE — Progress Notes (Unsigned)
 Subjective:  Patient ID: Robin Bonilla, female    DOB: 05-02-1956  Age: 68 y.o. MRN: [id number]  Chief Complaint  Patient presents with   Medical Management of Chronic Issues   HPI: Diabetes:  Complications: hyperlipidemia Glucose checking: 1-2 times per day Glucose logs: 130-170s. Hypoglycemia: no Most recent A1C: 9.4 Current medications: on xigduo  1000/5 mg twice daily,, but does not know the dose, Continue tresiba  56 U before bed. [name]  14 mg daily.  Last Eye Exam:  UTD Foot checks: daily   Hyperlipidemia: Current medications: Vascepa  1 g 2 capsules twice daily, Patient is on fenofibrate  160 mg once daily and rosuvastatin  20 mg nightly.    Hypertension: Complications: diabetes Currently on no bp medicine.   Diet: low sugar diet Exercise: not exercise   Hypothyroidism: Taking Levothyroxine  100 mcg daily   Complaining of left knee pops in and out. Causes pain. Tylenol  helps. Elevation helps.      05/04/2024    8:00 AM 11/25/2023    1:19 PM 08/19/2023    1:20 PM 06/02/2023    1:42 PM 01/29/2023    7:50 AM  Depression screen PHQ 2/9  Decreased Interest 0 0 0 0 0  Down, Depressed, Hopeless 0 0 0 0 0  PHQ - 2 Score 0 0 0 0 0  Altered sleeping 0 0 0    Tired, decreased energy 2 0 0    Change in appetite 2 0 0    Feeling bad or failure about yourself  2 0 0    Trouble concentrating 0 0 0    Moving slowly or fidgety/restless 0 0 0    Suicidal thoughts 0 0 0    PHQ-9 Score 6 0 0    Difficult doing work/chores Not difficult at all Not difficult at all Not difficult at all          05/04/2024    8:00 AM  Fall Risk   Falls in the past year? 0  Number falls in past yr: 0  Injury with Fall? 0  Risk for fall due to : No Fall Risks  Follow up Falls evaluation completed    Patient Care Team: Sherre Clapper, MD as PCP - General (Family Medicine)   Review of Systems  Constitutional:  Negative for chills, fatigue and fever.  HENT:  Negative for congestion, ear  pain and sore throat.   Respiratory:  Negative for cough and shortness of breath.   Cardiovascular:  Negative for chest pain.  Gastrointestinal:  Negative for abdominal pain, constipation, diarrhea, nausea and vomiting.  Genitourinary:  Negative for dysuria and urgency.  Musculoskeletal:  Negative for arthralgias and myalgias.  Skin:  Negative for rash.  Neurological:  Negative for dizziness and headaches.  Psychiatric/Behavioral:  Negative for dysphoric mood. The patient is not nervous/anxious.     Current Outpatient Medications on File Prior to Visit  Medication Sig Dispense Refill   ACCU-CHEK GUIDE TEST test strip TEST STRIPS TWICE A DAY 200 strip 1   fenofibrate  160 MG tablet TAKE 1 TABLET BY MOUTH EVERY DAY 90 tablet 1   icosapent  Ethyl (VASCEPA ) 1 g capsule Take 2 capsules (2 g total) by mouth 2 (two) times daily. 360 capsule 1   insulin  degludec (TRESIBA  FLEXTOUCH) 200 UNIT/ML FlexTouch Pen Inject 38 Units into the skin at bedtime. 18 mL 0   Insulin  Pen Needle 32G X 4 MM MISC Use 1 needle daily as directed 100 each 3   levothyroxine  (SYNTHROID ) 100 MCG tablet  Take 1 tablet (100 mcg total) by mouth daily before breakfast. 90 tablet 0   ondansetron  (ZOFRAN -ODT) 4 MG disintegrating tablet Take 1 tablet (4 mg total) by mouth every 8 (eight) hours as needed for nausea or vomiting. 10 tablet 0   rosuvastatin  (CRESTOR ) 20 MG tablet TAKE 1 TABLET BY MOUTH EVERY DAY 90 tablet 0   Semaglutide  ([name] ) 14 MG TABS Take 1 tablet (14 mg total) by mouth daily. 90 tablet 1   No current facility-administered medications on file prior to visit.   Past Medical History:  Diagnosis Date   Acute sinusitis    Depression    Diabetes mellitus 2005   Dysthymic disorder    Hirsutism    Hyperlipidemia    Rash, skin    Thrombocytopenia (HCC) 09/14/2021   Thyroid  disease    Upper respiratory tract infection due to COVID-19 virus 08/27/2022   Past Surgical History:  Procedure Laterality Date   BACK  SURGERY     CHOLECYSTECTOMY  05/30/11   PARTIAL HYSTERECTOMY     TONSILLECTOMY     TUBAL LIGATION      Family History  Problem Relation Age of Onset   Breast cancer Mother 25   CAD Father    CAD Maternal Grandmother    Hypertension Maternal Grandmother    Diabetes Paternal Grandfather    Diabetes Paternal Aunt    Social History   Socioeconomic History   Marital status: Married    Spouse name: Not on file   Number of children: Not on file   Years of education: Not on file   Highest education level: Not on file  Occupational History   Not on file  Tobacco Use   Smoking status: Never   Smokeless tobacco: Never  Substance and Sexual Activity   Alcohol use: No    Alcohol/week: 0.0 standard drinks of alcohol   Drug use: No   Sexual activity: Not on file  Other Topics Concern   Not on file  Social History Narrative   Not on file   Social Drivers of Health   Financial Resource Strain: Low Risk  (06/02/2023)   Overall Financial Resource Strain (CARDIA)    Difficulty of Paying Living Expenses: Not hard at all  Food Insecurity: No Food Insecurity (01/07/2024)   Hunger Vital Sign    Worried About Running Out of Food in the Last Year: Never true    Ran Out of Food in the Last Year: Never true  Transportation Needs: No Transportation Needs (01/07/2024)   PRAPARE - Administrator, Civil Service (Medical): No    Lack of Transportation (Non-Medical): No  Physical Activity: Unknown (06/02/2023)   Exercise Vital Sign    Days of Exercise per Week: 0 days    Minutes of Exercise per Session: Not on file  Recent Concern: Physical Activity - Inactive (06/02/2023)   Exercise Vital Sign    Days of Exercise per Week: 0 days    Minutes of Exercise per Session: 0 min  Stress: No Stress Concern Present (06/02/2023)   [employer] of Occupational Health - Occupational Stress Questionnaire    Feeling of Stress : Only a little  Social Connections: Socially Integrated (06/02/2023)    Social Connection and Isolation Panel    Frequency of Communication with Friends and Family: More than three times a week    Frequency of Social Gatherings with Friends and Family: More than three times a week    Attends Religious Services: More than 4  times per year    Active Member of Clubs or Organizations: Yes    Attends Banker Meetings: More than 4 times per year    Marital Status: Married    Objective:  BP 126/76   Pulse 80   Temp 98.1 F (36.7 C)   Ht 5' 1.5 (1.562 m)   Wt 184 lb (83.5 kg)   SpO2 98%   BMI 34.20 kg/m      05/04/2024    7:55 AM 01/07/2024    9:19 AM 12/14/2023    4:30 AM  BP/Weight  Systolic BP 126 130 140  Diastolic BP 76 86 75  Wt. (Lbs) 184 183   BMI 34.2 kg/m2 34.02 kg/m2     Physical Exam  {Perform Simple Foot Exam  Perform Detailed exam:1} {Insert foot Exam (Optional):30965}   Lab Results  Component Value Date   WBC 9.0 01/07/2024   HGB 15.0 01/07/2024   HCT 45.8 01/07/2024   PLT 270 01/07/2024   GLUCOSE 150 (H) 01/07/2024   CHOL 199 01/07/2024   TRIG 245 (H) 01/07/2024   HDL 31 (L) 01/07/2024   LDLCALC 124 (H) 01/07/2024   ALT 30 01/07/2024   AST 22 01/07/2024   NA 140 01/07/2024   K 4.5 01/07/2024   CL 100 01/07/2024   CREATININE 0.88 01/07/2024   BUN 13 01/07/2024   CO2 22 01/07/2024   TSH 2.070 06/05/2023   HGBA1C 9.4 (H) 01/07/2024   MICROALBUR 80 09/26/2021      Assessment & Plan:  Mixed diabetic hyperlipidemia associated with type 2 diabetes mellitus (HCC) -     Hemoglobin A1c -     CBC with Differential/Platelet  Mixed hyperlipidemia -     Lipid panel  Hypertension associated with diabetes (HCC) -     Comprehensive metabolic panel with GFR  Hypothyroidism (acquired) -     TSH  Other orders -     Dapagliflozin  Pro-metFORMIN  ER; Take 2 tablets by mouth daily.  Dispense: 180 tablet; Refill: 3     Meds ordered this encounter  Medications   Dapagliflozin  Pro-metFORMIN  ER (XIGDUO  XR) 01-999  MG TB24    Sig: Take 2 tablets by mouth daily.    Dispense:  180 tablet    Refill:  3    PAP    Orders Placed This Encounter  Procedures   Hemoglobin A1c   Lipid panel   Comprehensive metabolic panel with GFR   CBC with Differential/Platelet   TSH     Follow-up: No follow-ups on file.   I,Katherina A Bramblett,acting as a scribe for Abigail Free, MD.,have documented all relevant documentation on the behalf of Abigail Free, MD,as directed by  Abigail Free, MD while in the presence of Abigail Free, MD.   An After Visit Summary was printed and given to the patient.  Abigail Free, MD [facility] [phone]

## 2024-05-04 NOTE — Patient Instructions (Signed)
 VISIT SUMMARY:  During your visit, we discussed your concerns about managing your husband's care and your own health, including stress, diabetes management, and general well-being.  YOUR PLAN:  CAREGIVER STRESS: You are experiencing significant stress from managing your husband's health and feeling overwhelmed. -Contact [facility] for support assessment and additional aid options. -Consider additional home health support, such as a part-time aide.  TYPE 2 DIABETES MELLITUS: Your blood glucose levels range from 138 mg/dL to 819 mg/dL, and you prefer using test strips over a continuous monitor. -Ensure you have a supply of glucose strips. -Monitor your blood glucose 1-2 times daily.  HYPERLIPIDEMIA: Your cholesterol is managed with fenofibrate  and rosuvastatin . -Continue taking fenofibrate  and rosuvastatin  as prescribed.  HYPOTHYROIDISM: Your thyroid  condition is managed with levothyroxine . -Continue taking levothyroxine  100 mcg daily.  GENERAL HEALTH MAINTENANCE: You are physically active but have a poor diet and irregular meals due to caregiving. -Try to maintain a regular eating schedule and balanced diet.

## 2024-05-04 NOTE — Progress Notes (Signed)
 05/04/2024 Name: Robin Bonilla MRN: [id number] DOB: 1956/04/19  Chief Complaint  Patient presents with   Diabetes   Medication Assistance    Robin Bonilla is a 68 y.o. year old female who was referred for medication management by their primary care provider, Cox, Kirsten, MD. They presented for a face to face visit today.   They were referred to the pharmacist by their PCP for assistance in managing diabetes and medication access    Subjective:  Care Team: Primary Care Provider: Sherre Clapper, MD ; Next Scheduled Visit:   Future Appointments  Date Time Provider Department Center  08/16/2024  8:00 AM Cox, Clapper, MD [facility] None     Medication Access/Adherence  Current Pharmacy:  [facility] [address] [place] [address] Phone: [phone] Fax: [phone]  [facility] [address] [place] - [address] [place] [facility] [place] [address] Phone: [phone] Fax: [phone]  [facility] [address] [place] [address] Phone: [phone] Fax: [phone]  [id number] [place] [place] - [address] [place] [address] Phone: [phone] Fax: [phone]   Patient reports affordability concerns with their medications: Yes  Patient reports access/transportation concerns to their pharmacy: No  Patient reports adherence concerns with their medications:  Yes     Diabetes:  Current medications:  Tresiba  50units, [name]  14mg , xigduo  5-1000mg  BID  Current glucose readings: less than 200. Yesterday just starting doing Tresiba  50 units daily. Ranging 155-200 FBG.  Using glucose meter; testing 1 time daily  Patient denies hypoglycemic s/sx including dizziness, shakiness, sweating. Patient denies hyperglycemic symptoms including polyuria, polydipsia, polyphagia, nocturia, neuropathy, blurred  vision.  Current physical activity: no routine exercise, today states she would be comfortable leaving husband to walk 15-30 minutes a couple times a week as a start. Previously would focus diet on greens/lean meat like grilled chicken. D/t husband health issues/other health concerns in the family, dieT has shifted to convenience.   Current medication access support: Tresiba  50units, [name]  14mg , pending Xigduo .    Objective:  Lab Results  Component Value Date   HGBA1C 9.4 (H) 01/07/2024    Lab Results  Component Value Date   CREATININE 0.88 01/07/2024   BUN 13 01/07/2024   NA 140 01/07/2024   K 4.5 01/07/2024   CL 100 01/07/2024   CO2 22 01/07/2024    Lab Results  Component Value Date   CHOL 199 01/07/2024   HDL 31 (L) 01/07/2024   LDLCALC 124 (H) 01/07/2024   TRIG 245 (H) 01/07/2024   CHOLHDL 6.4 (H) 01/07/2024    Medications Reviewed Today     Reviewed by Pandora Cadet, [facility] (Pharmacist) on 05/04/24 at [phone]  Med List Status: <None>   Medication Order Taking? Sig Documenting Provider Last Dose Status Informant  ACCU-CHEK GUIDE TEST test strip [id number]  Check sugars twice daily. Sherre Clapper, MD  Active     Discontinued 05/04/24 0816   Dapagliflozin  Pro-metFORMIN  ER (XIGDUO  XR) 01-999 MG TB24 [id number]  Take 2 tablets by mouth daily. Sherre Clapper, MD  Active   fenofibrate  160 MG tablet [id number]  TAKE 1 TABLET BY MOUTH EVERY DAY Cox, Kirsten, MD  Active   icosapent  Ethyl (VASCEPA ) 1 g capsule [id number]  Take 2 capsules (2 g total) by mouth 2 (two) times daily. Cox, Kirsten, MD  Active   insulin  degludec (TRESIBA  FLEXTOUCH) 200 UNIT/ML FlexTouch Pen [id number]  Inject 38 Units into the skin at bedtime. CoxAbigail, MD  Active   Insulin  Pen Needle 32G X 4 MM MISC [id number]  Use 1 needle daily as directed Cox, Kirsten, MD  Active   levothyroxine  (SYNTHROID ) 100 MCG tablet [id number]  Take 1 tablet (100 mcg total) by mouth daily before breakfast. Sherre Abigail, MD  Active      Discontinued 05/04/24 [phone] (Change in therapy)     Discontinued 05/04/24 0816   ondansetron  (ZOFRAN -ODT) 4 MG disintegrating tablet [id number]  Take 1 tablet (4 mg total) by mouth every 8 (eight) hours as needed for nausea or vomiting. Sponseller, Pleasant SAUNDERS, PA-C  Active   rosuvastatin  (CRESTOR ) 20 MG tablet [id number]  TAKE 1 TABLET BY MOUTH EVERY DAY Cox, Kirsten, MD  Active   Semaglutide  ([name] ) 14 MG TABS [id number]  Take 1 tablet (14 mg total) by mouth daily. Cox, Kirsten, MD  Active             Assessment/Plan:   Diabetes: - Currently uncontrolled - Reviewed long term cardiovascular and renal outcomes of uncontrolled blood sugar - Reviewed goal A1c, goal fasting, and goal 2 hour post prandial glucose - Reviewed dietary modifications including focus on low carb diet, less processed/convenience foods - Reviewed lifestyle modifications including: starting to exercise routinely, agreeable to walk 15-30 minutes a couple times per week.   - Patient denies personal or family history of multiple endocrine neoplasia type 2, medullary thyroid  cancer; personal history of pancreatitis or gallbladder disease. - Recommend to check glucose daily  - Meets financial criteria for Xigduo  patient assistance program through [employer]. Will collaborate with provider, CPhT, and patient to pursue assistance.   Follow Up Plan: Will coordinate with CPhT to have 1040 sent to [employer] [phone]. We also applied for LIS today, so patient will inform me once the letter is received.   03/01/2024 update: - FBG remains around 200, continues with 50 units of basal insulin  daily. - Has not heard back regarding xigduo  from [employer] PAP despite tax return being sent out. Will ask CPhT for assistance --- message sent to [facility] today 03/01/2024. - Feeling a bit down lately, feels a weight from deaths in the family/changes in husbands health over the past two years. Not interested in seeing a doctor about this. Wants to pull  herself out of it. Plans to be more involved socially with friends and will consider going for walks a couple times/week by herself to get some me time - Plan to coordinate with CPhT regarding PAP and check back in with patient in a couple of weeks. Patient might also consider calling doctor as she previously felt this sort of sluggishness when experiencing low thyroid  levels.   03/22/2024 update: - reviewed FBG - reports sugars still running up near 200; xigduo  1000-5mg  BID just started last week through PAP. Now using 52 units of Tresiba  daily. [name]  14mg  daily. Will stop metformin  - reviewed LIS application updates - no mention of LIS approval or denial at this point. - reviewed non-pharm recommendations for DM management - has not been able to walk for exercise, slowly starting to feel more up to it.   05/04/2024: - FBG 138 today, 05/04/24 A1c pending  - counseled on low carb diet - Reviewed goal A1c, goal fasting, and goal 2 hour post prandial glucose - counseled on exercise, finds it hard to leave husband at the house. Encouraged patient to have a friend sit in with patient to allow her time to herself. Dr  Cox is working on getting some in-home support for patient as well.  - discussed possibility of starting on mealtime insulin  as a next step for BG management should this be needed. Would recommend novolog  5 units with largest meal of the day to start. Could fill out patient assistance through [employer].  - requested test strips to be refilled - appears already ordered by Dr Sherre.   Lang Sieve, PharmD, BCGP Clinical Pharmacist  [phone]

## 2024-05-05 ENCOUNTER — Ambulatory Visit: Payer: Self-pay | Admitting: Family Medicine

## 2024-05-06 ENCOUNTER — Emergency Department (HOSPITAL_COMMUNITY)
Admission: EM | Admit: 2024-05-06 | Discharge: 2024-05-06 | Disposition: A | Discharge status: Home / Self Care | Attending: Emergency Medicine | Admitting: Emergency Medicine

## 2024-05-06 ENCOUNTER — Emergency Department (HOSPITAL_COMMUNITY)

## 2024-05-06 ENCOUNTER — Encounter (HOSPITAL_COMMUNITY): Payer: Self-pay

## 2024-05-06 DIAGNOSIS — K5792 Diverticulitis of intestine, part unspecified, without perforation or abscess without bleeding: Secondary | ICD-10-CM

## 2024-05-06 DIAGNOSIS — Z9049 Acquired absence of other specified parts of digestive tract: Secondary | ICD-10-CM | POA: Diagnosis not present

## 2024-05-06 DIAGNOSIS — K5732 Diverticulitis of large intestine without perforation or abscess without bleeding: Secondary | ICD-10-CM | POA: Insufficient documentation

## 2024-05-06 DIAGNOSIS — Z794 Long term (current) use of insulin: Secondary | ICD-10-CM | POA: Insufficient documentation

## 2024-05-06 DIAGNOSIS — Z636 Dependent relative needing care at home: Secondary | ICD-10-CM | POA: Insufficient documentation

## 2024-05-06 DIAGNOSIS — R1030 Lower abdominal pain, unspecified: Secondary | ICD-10-CM | POA: Diagnosis not present

## 2024-05-06 DIAGNOSIS — G8929 Other chronic pain: Secondary | ICD-10-CM | POA: Insufficient documentation

## 2024-05-06 DIAGNOSIS — K760 Fatty (change of) liver, not elsewhere classified: Secondary | ICD-10-CM | POA: Diagnosis not present

## 2024-05-06 LAB — COMPREHENSIVE METABOLIC PANEL WITH GFR
ALT: 26 U/L (ref 0–44)
AST: 22 U/L (ref 15–41)
Albumin: 3.8 g/dL (ref 3.5–5.0)
Alkaline Phosphatase: 65 U/L (ref 38–126)
Anion gap: 12 (ref 5–15)
BUN: 7 mg/dL — ABNORMAL LOW (ref 8–23)
CO2: 21 mmol/L — ABNORMAL LOW (ref 22–32)
Calcium: 9.5 mg/dL (ref 8.9–10.3)
Chloride: 107 mmol/L (ref 98–111)
Creatinine, Ser: 0.8 mg/dL (ref 0.44–1.00)
GFR, Estimated: >60 mL/min (ref >60)
Glucose, Bld: 187 mg/dL — ABNORMAL HIGH (ref 70–99)
Potassium: 3.7 mmol/L (ref 3.5–5.1)
Sodium: 140 mmol/L (ref 135–145)
Total Bilirubin: 1.7 mg/dL — ABNORMAL HIGH (ref 0.0–1.2)
Total Protein: 7.5 g/dL (ref 6.5–8.1)

## 2024-05-06 LAB — CBC
HCT: 44.4 % (ref 36.0–46.0)
Hemoglobin: 14.4 g/dL (ref 12.0–15.0)
MCH: 26.2 pg (ref 26.0–34.0)
MCHC: 32.4 g/dL (ref 30.0–36.0)
MCV: 80.9 fL (ref 80.0–100.0)
Platelets: 251 K/uL (ref 150–400)
RBC: 5.49 MIL/uL — ABNORMAL HIGH (ref 3.87–5.11)
RDW: 14.5 % (ref 11.5–15.5)
WBC: 12.4 K/uL — ABNORMAL HIGH (ref 4.0–10.5)
nRBC: 0 % (ref 0.0–0.2)

## 2024-05-06 LAB — URINALYSIS, ROUTINE W REFLEX MICROSCOPIC
Bilirubin Urine: NEGATIVE
Glucose, UA: >=500 mg/dL — AB
Ketones, ur: NEGATIVE mg/dL
Leukocytes,Ua: NEGATIVE
Nitrite: POSITIVE — AB
Protein, ur: 30 mg/dL — AB
Specific Gravity, Urine: 1.032 — ABNORMAL HIGH (ref 1.005–1.030)
pH: 5 (ref 5.0–8.0)

## 2024-05-06 LAB — LIPASE, BLOOD: Lipase: 25 U/L (ref 11–51)

## 2024-05-06 MED ORDER — IOHEXOL 350 MG/ML SOLN
75.0000 mL | Freq: Once | INTRAVENOUS | Status: AC | PRN
  Administered 2024-05-06: 75 mL via INTRAVENOUS

## 2024-05-06 MED ORDER — CIPROFLOXACIN HCL 500 MG PO TABS: 500.0000 mg | ORAL_TABLET | Freq: Two times a day (BID) | ORAL | 0 refills | Status: AC

## 2024-05-06 MED ORDER — SODIUM CHLORIDE 0.9 % IV BOLUS
1000.0000 mL | Freq: Once | INTRAVENOUS | Status: AC
  Administered 2024-05-06: 1000 mL via INTRAVENOUS

## 2024-05-06 MED ORDER — METRONIDAZOLE 500 MG PO TABS
500.0000 mg | ORAL_TABLET | Freq: Once | ORAL | Status: AC
  Administered 2024-05-06: 500 mg via ORAL
  Filled 2024-05-06: qty 1

## 2024-05-06 MED ORDER — METRONIDAZOLE 500 MG PO TABS: 500.0000 mg | ORAL_TABLET | Freq: Three times a day (TID) | ORAL | 0 refills | Status: AC

## 2024-05-06 MED ORDER — KETOROLAC TROMETHAMINE 30 MG/ML IJ SOLN
30.0000 mg | Freq: Once | INTRAMUSCULAR | Status: AC
  Administered 2024-05-06: 30 mg via INTRAVENOUS
  Filled 2024-05-06: qty 1

## 2024-05-06 MED ORDER — ONDANSETRON HCL 4 MG/2ML IJ SOLN
4.0000 mg | Freq: Once | INTRAMUSCULAR | Status: AC
  Administered 2024-05-06: 4 mg via INTRAVENOUS
  Filled 2024-05-06: qty 2

## 2024-05-06 MED ORDER — ONDANSETRON 4 MG PO TBDP: 4.0000 mg | ORAL_TABLET | Freq: Three times a day (TID) | ORAL | 0 refills | Status: AC | PRN

## 2024-05-06 MED ORDER — FLUCONAZOLE 200 MG PO TABS: 200.0000 mg | ORAL_TABLET | Freq: Once | ORAL | 0 refills | Status: DC | PRN

## 2024-05-06 MED ORDER — CIPROFLOXACIN HCL 500 MG PO TABS
500.0000 mg | ORAL_TABLET | Freq: Once | ORAL | Status: AC
  Administered 2024-05-06: 500 mg via ORAL
  Filled 2024-05-06: qty 1

## 2024-05-06 NOTE — Assessment & Plan Note (Signed)
 Control: improved Recommend check sugars fasting daily. Recommend check feet daily. Recommend annual eye exams. Medicines: [name]  14 mg, Xigduo  1000/5 mg twice daily, and continue tresiba  56 U before bed. Continue to work on eating a healthy diet and exercise.  Labs drawn today.

## 2024-05-06 NOTE — Assessment & Plan Note (Signed)
 Previously well controlled Continue Synthroid  100 mcg daily.  Check tsh.

## 2024-05-06 NOTE — ED Notes (Signed)
Pt d/c home per EDP order. Discharge summary reviewed, pt verbalizes understanding. NAD.

## 2024-05-06 NOTE — ED Notes (Signed)
 Introduced self to patient at this time. Patient is resting in bed with visible chest rise and fall. There are no further requests at this time.

## 2024-05-06 NOTE — Assessment & Plan Note (Signed)
 Check xray

## 2024-05-06 NOTE — ED Provider Notes (Signed)
 [place] EMERGENCY DEPARTMENT AT [facility] Provider Note   CSN: [id number] Arrival date & time: 05/06/24  1127     Patient presents with: Emesis   Robin Bonilla is a 68 y.o. female here with lower abdominal pain, onset yesterday, queasy and not wanting to eat or drink. Regular BM today.  Hx of hysterectomy and cholecystectomy and diverticulosis.   HPI     Prior to Admission medications   Medication Sig Start Date End Date Taking? Authorizing Provider  ciprofloxacin  (CIPRO ) 500 MG tablet Take 1 tablet (500 mg total) by mouth every 12 (twelve) hours for 7 days. 05/06/24 05/13/24 Yes Junice Fei, Donnice PARAS, MD  fluconazole  (DIFLUCAN ) 200 MG tablet Take 1 tablet (200 mg total) by mouth once as needed for up to 1 dose. 05/06/24  Yes Stirling Orton, Donnice PARAS, MD  metroNIDAZOLE  (FLAGYL ) 500 MG tablet Take 1 tablet (500 mg total) by mouth 3 (three) times daily for 7 days. 05/06/24 05/13/24 Yes Pamalee Marcoe, Donnice PARAS, MD  ondansetron  (ZOFRAN -ODT) 4 MG disintegrating tablet Take 1 tablet (4 mg total) by mouth every 8 (eight) hours as needed for up to 12 doses for nausea or vomiting. 05/06/24  Yes Cottie Donnice PARAS, MD  ACCU-CHEK GUIDE TEST test strip Check sugars twice daily. 05/04/24   CoxAbigail, MD  Dapagliflozin  Pro-metFORMIN  ER (XIGDUO  XR) 01-999 MG TB24 Take 2 tablets by mouth daily. 05/04/24   CoxAbigail, MD  fenofibrate  160 MG tablet TAKE 1 TABLET BY MOUTH EVERY DAY 04/07/24   Cox, Abigail, MD  icosapent  Ethyl (VASCEPA ) 1 g capsule Take 2 capsules (2 g total) by mouth 2 (two) times daily. 01/28/24   Cox, Abigail, MD  insulin  degludec (TRESIBA  FLEXTOUCH) 200 UNIT/ML FlexTouch Pen Inject 38 Units into the skin at bedtime. 11/25/23   Sherre Abigail, MD  Insulin  Pen Needle 32G X 4 MM MISC Use 1 needle daily as directed 08/07/23   CoxAbigail, MD  levothyroxine  (SYNTHROID ) 100 MCG tablet Take 1 tablet (100 mcg total) by mouth daily before breakfast. 04/04/24   Cox, Abigail, MD  ondansetron  (ZOFRAN -ODT) 4  MG disintegrating tablet Take 1 tablet (4 mg total) by mouth every 8 (eight) hours as needed for nausea or vomiting. 12/14/23   Sponseller, Pleasant R, PA-C  rosuvastatin  (CRESTOR ) 20 MG tablet TAKE 1 TABLET BY MOUTH EVERY DAY 03/20/24   Cox, Kirsten, MD  Semaglutide  ([name] ) 14 MG TABS Take 1 tablet (14 mg total) by mouth daily. 11/25/23   CoxAbigail, MD    Allergies: Lunesta  [eszopiclone ], Penicillins, and Simvastatin    Review of Systems  Updated Vital Signs BP 130/66   Pulse 88   Temp 98.2 F (36.8 C) (Oral)   Resp 18   Ht 5' 1 (1.549 m)   Wt 83.5 kg   SpO2 100%   BMI 34.77 kg/m   Physical Exam Constitutional:      General: She is not in acute distress. HENT:     Head: Normocephalic and atraumatic.  Eyes:     Conjunctiva/sclera: Conjunctivae normal.     Pupils: Pupils are equal, round, and reactive to light.  Cardiovascular:     Rate and Rhythm: Normal rate and regular rhythm.  Pulmonary:     Effort: Pulmonary effort is normal. No respiratory distress.  Abdominal:     General: There is no distension.     Tenderness: There is abdominal tenderness in the right lower quadrant, suprapubic area and left lower quadrant.  Skin:    General:  Skin is warm and dry.  Neurological:     General: No focal deficit present.     Mental Status: She is alert. Mental status is at baseline.  Psychiatric:        Mood and Affect: Mood normal.        Behavior: Behavior normal.     (all labs ordered are listed, but only abnormal results are displayed) Labs Reviewed  COMPREHENSIVE METABOLIC PANEL WITH GFR - Abnormal; Notable for the following components:      Result Value   CO2 21 (*)    Glucose, Bld 187 (*)    BUN 7 (*)    Total Bilirubin 1.7 (*)    All other components within normal limits  CBC - Abnormal; Notable for the following components:   WBC 12.4 (*)    RBC 5.49 (*)    All other components within normal limits  URINALYSIS, ROUTINE W REFLEX MICROSCOPIC - Abnormal;  Notable for the following components:   Specific Gravity, Urine 1.032 (*)    Glucose, UA >=500 (*)    Hgb urine dipstick SMALL (*)    Protein, ur 30 (*)    Nitrite POSITIVE (*)    Bacteria, UA MANY (*)    All other components within normal limits  LIPASE, BLOOD    EKG: None  Radiology: CT ABDOMEN PELVIS W CONTRAST Result Date: 05/06/2024 CLINICAL DATA:  Lower abdominal pain and history of diverticulosis EXAM: CT ABDOMEN AND PELVIS WITH CONTRAST TECHNIQUE: Multidetector CT imaging of the abdomen and pelvis was performed using the standard protocol following bolus administration of intravenous contrast. RADIATION DOSE REDUCTION: This exam was performed according to the departmental dose-optimization program which includes automated exposure control, adjustment of the mA and/or kV according to patient size and/or use of iterative reconstruction technique. CONTRAST:  75mL OMNIPAQUE  IOHEXOL  350 MG/ML SOLN COMPARISON:  03/02/2023 FINDINGS: Lower chest: No acute abnormality. Hepatobiliary: Diffuse fatty infiltration is noted in the liver. Gallbladder has been surgically removed. Pancreas: Unremarkable. No pancreatic ductal dilatation or surrounding inflammatory changes. Spleen: Normal in size without focal abnormality. Adrenals/Urinary Tract: Adrenal glands are within normal limits. Kidneys demonstrate a normal enhancement pattern bilaterally. Normal excretion is seen. No renal calculi or obstructive changes are noted. Bladder is decompressed. Stomach/Bowel: There are changes consistent with diverticulitis in the mid sigmoid colon. Extensive inflammatory changes noted. No discrete perforation or abscess formation is noted. The more proximal colon is within normal limits. Appendix is within normal limits. Small bowel and stomach are unremarkable. Vascular/Lymphatic: No significant vascular findings are present. No enlarged abdominal or pelvic lymph nodes. Reproductive: Status post hysterectomy. No adnexal  masses. Other: No abdominal wall hernia or abnormality. No abdominopelvic ascites. Musculoskeletal: No acute or significant osseous findings. IMPRESSION: Diverticulitis in the mid sigmoid colon without evidence of perforation or abscess formation. Fatty liver Electronically Signed   By: Oneil Devonshire M.D.   On: 05/06/2024 19:22     Procedures   Medications Ordered in the ED  ketorolac  (TORADOL ) 30 MG/ML injection 30 mg (30 mg Intravenous Given 05/06/24 1654)  sodium chloride  0.9 % bolus 1,000 mL (0 mLs Intravenous Stopped 05/06/24 2000)  ondansetron  (ZOFRAN ) injection 4 mg (4 mg Intravenous Given 05/06/24 1654)  iohexol  (OMNIPAQUE ) 350 MG/ML injection 75 mL (75 mLs Intravenous Contrast Given 05/06/24 1912)  ciprofloxacin  (CIPRO ) tablet 500 mg (500 mg Oral Given 05/06/24 2009)  metroNIDAZOLE  (FLAGYL ) tablet 500 mg (500 mg Oral Given 05/06/24 2009)    Clinical Course as of 05/07/24 1425  Fri May 06, 2024  2015 Pt reports no dysuria symptoms.  UA could be contaminant - unclear - but cipro  for diverticulitis should cover for this as well [MT]    Clinical Course User Index [MT] Mckennah Kretchmer, Donnice PARAS, MD                                 Medical Decision Making Amount and/or Complexity of Data Reviewed Labs: ordered. Radiology: ordered.  Risk Prescription drug management.   This patient presents to the ED with concern for lower abdominal pain. This involves an extensive number of treatment options, and is a complaint that carries with it a high risk of complications and morbidity.  The differential diagnosis includes colitis vs diverticulitis vs appendicitis vs UTI vs other   I ordered and personally interpreted labs.  The pertinent results include:  WBC mildly elevated  I ordered imaging studies including CT abdomen pelvis with contrast I independently visualized and interpreted imaging which showed acute uncomplicated diverticulitis I agree with the radiologist interpretation  I ordered medication  including IV toradol , zofran  and fluids for pain and hydration; antibiotics for diverticulitis (and possible UTI)  I have reviewed the patients home medicines and have made adjustments as needed  Test Considered: doubt AAA, mesenteric ischemia; no indication for angiogram at this time   After the interventions noted above, I reevaluated the patient and found that they have: improved  Dispostion:  After consideration of the diagnostic results and the patients response to treatment, I feel that the patent would benefit from outpatient follow up      Final diagnoses:  Diverticulitis    ED Discharge Orders          Ordered    ciprofloxacin  (CIPRO ) 500 MG tablet  Every 12 hours        05/06/24 2016    metroNIDAZOLE  (FLAGYL ) 500 MG tablet  3 times daily        05/06/24 2016    fluconazole  (DIFLUCAN ) 200 MG tablet  Once PRN        05/06/24 2016    ondansetron  (ZOFRAN -ODT) 4 MG disintegrating tablet  Every 8 hours PRN        05/06/24 2016               Cottie Donnice PARAS, MD 05/07/24 1425

## 2024-05-06 NOTE — Assessment & Plan Note (Signed)
 Recommend continue to work on eating healthy diet and exercise. Comorbidity: diabetes.

## 2024-05-06 NOTE — Assessment & Plan Note (Signed)
 Significant stress from managing husband's health, feeling overwhelmed, inadequate, and lacking support. - Contact [facility] for support assessment and additional aid options. - Discuss additional home health support, such as a part-time aide.

## 2024-05-06 NOTE — Assessment & Plan Note (Signed)
 Well controlled.  No changes to medicines. Continue fenofibrate  160 mg once daily and rosuvastatin  20 mg nightly. Check if taking vascepa . Continue to work on eating a healthy diet and exercise.  Labs drawn today.

## 2024-05-06 NOTE — ED Triage Notes (Signed)
 Pt states she has been vomiting since yesterday, states she feels like her stomach is on fire.

## 2024-05-06 NOTE — ED Notes (Signed)
 Unable to obtain labs

## 2024-05-09 ENCOUNTER — Other Ambulatory Visit: Payer: Self-pay

## 2024-05-09 ENCOUNTER — Other Ambulatory Visit (HOSPITAL_BASED_OUTPATIENT_CLINIC_OR_DEPARTMENT_OTHER): Payer: Self-pay

## 2024-05-09 MED FILL — Rosuvastatin Calcium Tab 20 MG: ORAL | 90 days supply | Qty: 90 | Fill #0 | Status: AC

## 2024-05-09 NOTE — Progress Notes (Signed)
 Pharmacy Quality Measure Review  This patient is appearing on a report for being at risk of failing the adherence measure for cholesterol (statin) medications this calendar year.   Medication: rosuvastatin  20 mg daily Last fill date: 12/02/2023 for 90 day supply  Patient states that she has been having a hard time picking up her medications from [facility]. She states that she has been having difficulties with insurance, so she has missed a couple of her refills. In regards to rosuvastatin , she states that she does not have any on hand, but she is opening to refilling the prescription. [facility], and would [facility].   Called [facility], requested for [facility], and informed patient that the prescription would be ready to pick-up today. Patient states that she will be willing to pick-up the medication tomorrow.   Jaquise Faux  [place], [facility]

## 2024-05-11 NOTE — Progress Notes (Signed)
   05/11/2024  Patient ID: Robin Bonilla, female   DOB: 1956/08/24, 68 y.o.   MRN: [id number]  Pharmacy Quality Measure Review  This patient is appearing on a report for being at risk of failing the adherence measure for diabetes medications this calendar year.   Medication: metformin  Last fill date:  03/19/24 for 90 day supply. Could fail measure d/t now being on xigduo  through patient assistance program.   Insurance report was not up to date. No action needed at this time.   Lang Sieve, PharmD, BCGP Clinical Pharmacist  [phone]

## 2024-05-20 ENCOUNTER — Ambulatory Visit (HOSPITAL_BASED_OUTPATIENT_CLINIC_OR_DEPARTMENT_OTHER)
Admission: RE | Admit: 2024-05-20 | Discharge: 2024-05-20 | Disposition: A | Discharge status: Home / Self Care | Source: Ambulatory Visit | Attending: Family Medicine | Admitting: Family Medicine

## 2024-05-20 DIAGNOSIS — G8929 Other chronic pain: Secondary | ICD-10-CM

## 2024-05-20 DIAGNOSIS — M25562 Pain in left knee: Secondary | ICD-10-CM | POA: Diagnosis not present

## 2024-05-20 DIAGNOSIS — M1712 Unilateral primary osteoarthritis, left knee: Secondary | ICD-10-CM | POA: Diagnosis not present

## 2024-05-25 ENCOUNTER — Telehealth: Payer: Self-pay

## 2024-05-25 DIAGNOSIS — E1169 Type 2 diabetes mellitus with other specified complication: Secondary | ICD-10-CM

## 2024-05-26 ENCOUNTER — Other Ambulatory Visit (HOSPITAL_BASED_OUTPATIENT_CLINIC_OR_DEPARTMENT_OTHER): Payer: Self-pay

## 2024-05-26 MED ORDER — TRESIBA FLEXTOUCH 200 UNIT/ML ~~LOC~~ SOPN
38.0000 [IU] | PEN_INJECTOR | Freq: Every day | SUBCUTANEOUS | 0 refills | Status: DC
  Filled 2024-05-26: qty 18, 94d supply, fill #0

## 2024-05-27 NOTE — Telephone Encounter (Signed)
 Reviewed

## 2024-05-31 ENCOUNTER — Telehealth: Payer: Self-pay

## 2024-05-31 ENCOUNTER — Other Ambulatory Visit (HOSPITAL_BASED_OUTPATIENT_CLINIC_OR_DEPARTMENT_OTHER): Payer: Self-pay

## 2024-05-31 NOTE — Progress Notes (Signed)
   05/31/2024  Patient ID: Robin Bonilla, female   DOB: 06-12-1956, 68 y.o.   MRN: [id number]  Incoming call from pt regarding tresiba  Rx and voucher. States that she had not obtained Rx yet. Reviewed pharmacy receipt of rx, processed at no cost.   Patient states home phone has been offline so is not sure if someone had reached out to her.   Confirmed that patient had the contact information for [facility] - [phone]. Patient to call and let me know if there is any problem with picking up tresiba .   Lang Sieve, PharmD, BCGP Clinical Pharmacist  [phone]

## 2024-06-01 ENCOUNTER — Other Ambulatory Visit (HOSPITAL_BASED_OUTPATIENT_CLINIC_OR_DEPARTMENT_OTHER): Payer: Self-pay

## 2024-06-07 ENCOUNTER — Other Ambulatory Visit: Payer: Self-pay

## 2024-06-07 ENCOUNTER — Telehealth: Payer: Self-pay

## 2024-06-07 NOTE — Progress Notes (Signed)
   06/07/2024  Patient ID: Robin Bonilla, female   DOB: 25-Oct-1955, 68 y.o.   MRN: [id number]  Patient's spouse in hospital at time of call, will attempt again Thursday.  Can call me at the number below if there are any questions.   Lang Sieve, PharmD, BCGP Clinical Pharmacist  [phone]

## 2024-06-09 ENCOUNTER — Other Ambulatory Visit: Payer: Self-pay

## 2024-06-09 NOTE — Progress Notes (Signed)
   06/09/2024 Name: Solae Norling MRN: [id number] DOB: Apr 21, 1956  Chief Complaint  Patient presents with   Diabetes   Medication Assistance    Arnisha Iline Buchinger is a 68 y.o. year old female who presented for a telephone visit.   They were referred to the pharmacist by their PCP for assistance in managing diabetes and medication access.   Future Appointments  Date Time Provider Department Center  08/16/2024  8:00 AM Cox, Abigail, MD [facility] [place]    Medication Access/Adherence   - spoke with patient regarding tresiba , confirmed that she has picked up from [facility] and has used the voucher to obtain. However she still has not received the tresiba , rybelsus , 32G needles through the program.  - husband has been in the hospital earlier this week, due to the chaos has not had an opportunity to test her BG.  - reports having about a 45 DS of rybelsus  on hand from samples.   Spoke with [facility] PAP - orders remain in process for the three medications. Will request CPhT f/u next week.    Lang Sieve, PharmD, BCGP Clinical Pharmacist  [phone]

## 2024-06-15 NOTE — Progress Notes (Signed)
   06/15/2024  Patient ID: Robin Bonilla, female   DOB: September 15, 1956, 68 y.o.   MRN: [id number]  Pharmacy Quality Measure Review  This patient is appearing on a report for being at risk of failing the adherence measure for cholesterol (statin) medications this calendar year.   Medication: rosuvastatin  20mg  once daily. 8/14 90 DS, no filling at [employer] report was not up to date. No action needed at this time.   Lang Sieve, PharmD, BCGP Clinical Pharmacist  [phone]

## 2024-06-21 ENCOUNTER — Telehealth: Payer: Self-pay

## 2024-06-21 NOTE — Telephone Encounter (Signed)
 Called patient and informed her that her patient assistance for Tresiba /42095 needles, and [name]  has been received and told her that it is ready for pick up.    Received [name]  14 mg 4 bottles of 30 tablets  NDC# [address]-69 Lot# [id number] EXP:11/26/24  Novofine pen needles 32 G tip- 2 boxes  NDC# 99830-814-810 Lot# [id number] Exp: 09/28/2028  Tresiba  U200 3 pens each box= 3 boxes  NDC: [address]-86 Lot# [id number] EXP: 07/29/2026

## 2024-06-22 NOTE — Telephone Encounter (Signed)
 Patient picked up patient assistance

## 2024-07-04 ENCOUNTER — Ambulatory Visit (INDEPENDENT_AMBULATORY_CARE_PROVIDER_SITE_OTHER)

## 2024-07-04 DIAGNOSIS — Z23 Encounter for immunization: Secondary | ICD-10-CM | POA: Diagnosis not present

## 2024-07-19 ENCOUNTER — Telehealth: Payer: Self-pay

## 2024-07-19 NOTE — Telephone Encounter (Addendum)
 2026 Renewal  PAP: Patient assistance application for Xigduo  through [employer] ([employer]) has been mailed to pt's home address on file. Provider portion of application will be faxed to provider's office.    Provider portion of application has been faxed to Dr. Abigail Free

## 2024-07-20 ENCOUNTER — Other Ambulatory Visit (HOSPITAL_COMMUNITY): Payer: Self-pay

## 2024-08-09 NOTE — Telephone Encounter (Signed)
 Received provider portion of patient assistance application

## 2024-08-15 NOTE — Progress Notes (Deleted)
 Subjective:  Patient ID: Robin Bonilla, female    DOB: 04/29/1956  Age: 68 y.o. MRN: [id number]  No chief complaint on file.   HPI: Discussed the use of AI scribe software for clinical note transcription with the patient, who gave verbal consent to proceed.  History of Present Illness        05/04/2024    8:00 AM 11/25/2023    1:19 PM 08/19/2023    1:20 PM 06/02/2023    1:42 PM 01/29/2023    7:50 AM  Depression screen PHQ 2/9  Decreased Interest 0 0 0 0 0  Down, Depressed, Hopeless 0 0 0 0 0  PHQ - 2 Score 0 0 0 0 0  Altered sleeping 0 0 0    Tired, decreased energy 2 0 0    Change in appetite 2 0 0    Feeling bad or failure about yourself  2 0 0    Trouble concentrating 0 0 0    Moving slowly or fidgety/restless 0 0 0    Suicidal thoughts 0 0 0    PHQ-9 Score 6  0  0     Difficult doing work/chores Not difficult at all Not difficult at all Not difficult at all       Data saved with a previous flowsheet row definition        05/04/2024    8:00 AM  Fall Risk   Falls in the past year? 0  Number falls in past yr: 0  Injury with Fall? 0  Risk for fall due to : No Fall Risks  Follow up Falls evaluation completed    Patient Care Team: Sherre Clapper, MD as PCP - General (Family Medicine)   Review of Systems  Current Outpatient Medications on File Prior to Visit  Medication Sig Dispense Refill   ACCU-CHEK GUIDE TEST test strip Check sugars twice daily. 200 strip 11   Dapagliflozin  Pro-metFORMIN  ER (XIGDUO  XR) 01-999 MG TB24 Take 2 tablets by mouth daily. 180 tablet 3   fenofibrate  160 MG tablet TAKE 1 TABLET BY MOUTH EVERY DAY 90 tablet 1   fluconazole  (DIFLUCAN ) 200 MG tablet Take 1 tablet (200 mg total) by mouth once as needed for up to 1 dose. 1 tablet 0   icosapent  Ethyl (VASCEPA ) 1 g capsule Take 2 capsules (2 g total) by mouth 2 (two) times daily. 360 capsule 1   insulin  degludec (TRESIBA  FLEXTOUCH) 200 UNIT/ML FlexTouch Pen Inject 38 Units into the skin at  bedtime. 18 mL 0   Insulin  Pen Needle 32G X 4 MM MISC Use 1 needle daily as directed 100 each 3   levothyroxine  (SYNTHROID ) 100 MCG tablet Take 1 tablet (100 mcg total) by mouth daily before breakfast. 90 tablet 0   ondansetron  (ZOFRAN -ODT) 4 MG disintegrating tablet Take 1 tablet (4 mg total) by mouth every 8 (eight) hours as needed for nausea or vomiting. 10 tablet 0   ondansetron  (ZOFRAN -ODT) 4 MG disintegrating tablet Take 1 tablet (4 mg total) by mouth every 8 (eight) hours as needed for up to 12 doses for nausea or vomiting. 12 tablet 0   rosuvastatin  (CRESTOR ) 20 MG tablet Take 1 tablet (20 mg total) by mouth daily. 90 tablet 0   Semaglutide  ([name] ) 14 MG TABS Take 1 tablet (14 mg total) by mouth daily. 90 tablet 1   No current facility-administered medications on file prior to visit.   Past Medical History:  Diagnosis Date   Acute sinusitis  Depression    Diabetes mellitus 2005   Dysthymic disorder    Hirsutism    Hyperlipidemia    Rash, skin    Thrombocytopenia 09/14/2021   Thyroid  disease    Upper respiratory tract infection due to COVID-19 virus 08/27/2022   Past Surgical History:  Procedure Laterality Date   BACK SURGERY     CHOLECYSTECTOMY  05/30/11   PARTIAL HYSTERECTOMY     TONSILLECTOMY     TUBAL LIGATION      Family History  Problem Relation Age of Onset   Breast cancer Mother 64   CAD Father    CAD Maternal Grandmother    Hypertension Maternal Grandmother    Diabetes Paternal Grandfather    Diabetes Paternal Aunt    Social History   Socioeconomic History   Marital status: Married    Spouse name: Not on file   Number of children: Not on file   Years of education: Not on file   Highest education level: Not on file  Occupational History   Not on file  Tobacco Use   Smoking status: Never   Smokeless tobacco: Never  Substance and Sexual Activity   Alcohol use: No    Alcohol/week: 0.0 standard drinks of alcohol   Drug use: No   Sexual activity:  Not on file  Other Topics Concern   Not on file  Social History Narrative   Not on file   Social Drivers of Health   Financial Resource Strain: Low Risk  (06/02/2023)   Overall Financial Resource Strain (CARDIA)    Difficulty of Paying Living Expenses: Not hard at all  Food Insecurity: No Food Insecurity (01/07/2024)   Hunger Vital Sign    Worried About Running Out of Food in the Last Year: Never true    Ran Out of Food in the Last Year: Never true  Transportation Needs: No Transportation Needs (01/07/2024)   PRAPARE - Administrator, Civil Service (Medical): No    Lack of Transportation (Non-Medical): No  Physical Activity: Unknown (06/02/2023)   Exercise Vital Sign    Days of Exercise per Week: 0 days    Minutes of Exercise per Session: Not on file  Recent Concern: Physical Activity - Inactive (06/02/2023)   Exercise Vital Sign    Days of Exercise per Week: 0 days    Minutes of Exercise per Session: 0 min  Stress: No Stress Concern Present (06/02/2023)   [employer] of Occupational Health - Occupational Stress Questionnaire    Feeling of Stress : Only a little  Social Connections: Socially Integrated (06/02/2023)   Social Connection and Isolation Panel    Frequency of Communication with Friends and Family: More than three times a week    Frequency of Social Gatherings with Friends and Family: More than three times a week    Attends Religious Services: More than 4 times per year    Active Member of [employer] or Organizations: Yes    Attends Engineer, Structural: More than 4 times per year    Marital Status: Married    Objective:  There were no vitals taken for this visit.     05/06/2024    8:13 PM 05/06/2024    3:45 PM 05/06/2024   11:38 AM  BP/Weight  Systolic BP 130 123   Diastolic BP 66 71   Wt. (Lbs)   184  BMI   34.77 kg/m2    Physical Exam  {Perform Simple Foot Exam  Perform Detailed exam:1} {  Insert foot Exam (Optional):30965}   Lab Results   Component Value Date   WBC 12.4 (H) 05/06/2024   HGB 14.4 05/06/2024   HCT 44.4 05/06/2024   PLT 251 05/06/2024   GLUCOSE 187 (H) 05/06/2024   CHOL 158 05/04/2024   TRIG 120 05/04/2024   HDL 29 (L) 05/04/2024   LDLCALC 107 (H) 05/04/2024   ALT 26 05/06/2024   AST 22 05/06/2024   NA 140 05/06/2024   K 3.7 05/06/2024   CL 107 05/06/2024   CREATININE 0.80 05/06/2024   BUN 7 (L) 05/06/2024   CO2 21 (L) 05/06/2024   TSH 4.540 (H) 05/04/2024   HGBA1C 8.0 (H) 05/04/2024    Results for orders placed or performed during the hospital encounter of 05/06/24  Urinalysis, Routine w reflex microscopic -Urine, Clean Catch   Collection Time: 05/06/24 11:39 AM  Result Value Ref Range   Color, Urine YELLOW YELLOW   APPearance CLEAR CLEAR   Specific Gravity, Urine 1.032 (H) 1.005 - 1.030   pH 5.0 5.0 - 8.0   Glucose, UA >=500 (A) NEGATIVE mg/dL   Hgb urine dipstick SMALL (A) NEGATIVE   Bilirubin Urine NEGATIVE NEGATIVE   Ketones, ur NEGATIVE NEGATIVE mg/dL   Protein, ur 30 (A) NEGATIVE mg/dL   Nitrite POSITIVE (A) NEGATIVE   Leukocytes,Ua NEGATIVE NEGATIVE   RBC / HPF 11-20 0 - 5 RBC/hpf   WBC, UA 6-10 0 - 5 WBC/hpf   Bacteria, UA MANY (A) NONE SEEN   Squamous Epithelial / HPF 0-5 0 - 5 /HPF   Mucus PRESENT   Lipase, blood   Collection Time: 05/06/24 11:54 AM  Result Value Ref Range   Lipase 25 11 - 51 U/L  Comprehensive metabolic panel   Collection Time: 05/06/24 11:54 AM  Result Value Ref Range   Sodium 140 135 - 145 mmol/L   Potassium 3.7 3.5 - 5.1 mmol/L   Chloride 107 98 - 111 mmol/L   CO2 21 (L) 22 - 32 mmol/L   Glucose, Bld 187 (H) 70 - 99 mg/dL   BUN 7 (L) 8 - 23 mg/dL   Creatinine, Ser 9.19 0.44 - 1.00 mg/dL   Calcium  9.5 8.9 - 10.3 mg/dL   Total Protein 7.5 6.5 - 8.1 g/dL   Albumin 3.8 3.5 - 5.0 g/dL   AST 22 15 - 41 U/L   ALT 26 0 - 44 U/L   Alkaline Phosphatase 65 38 - 126 U/L   Total Bilirubin 1.7 (H) 0.0 - 1.2 mg/dL   GFR, Estimated >39 >39 mL/min   Anion  gap 12 5 - 15  CBC   Collection Time: 05/06/24 11:54 AM  Result Value Ref Range   WBC 12.4 (H) 4.0 - 10.5 K/uL   RBC 5.49 (H) 3.87 - 5.11 MIL/uL   Hemoglobin 14.4 12.0 - 15.0 g/dL   HCT 55.5 63.9 - 53.9 %   MCV 80.9 80.0 - 100.0 fL   MCH 26.2 26.0 - 34.0 pg   MCHC 32.4 30.0 - 36.0 g/dL   RDW 85.4 88.4 - 84.4 %   Platelets 251 150 - 400 K/uL   nRBC 0.0 0.0 - 0.2 %  .  Assessment & Plan:   Assessment & Plan Hypothyroidism (acquired)     Mixed hyperlipidemia     Type 2 diabetes mellitus with hyperglycemia, with long-term current use of insulin  (HCC)     Essential hypertension, benign     Mild recurrent major depression  There is no height or weight on file to calculate BMI.  Assessment and Plan Assessment & Plan      No orders of the defined types were placed in this encounter.   No orders of the defined types were placed in this encounter.      Follow-up: No follow-ups on file.  An After Visit Summary was printed and given to the patient.  Abigail Free, MD [facility] [phone]

## 2024-08-15 NOTE — Assessment & Plan Note (Deleted)
 SABRA

## 2024-08-15 NOTE — Assessment & Plan Note (Deleted)
 Robin Bonilla

## 2024-08-16 ENCOUNTER — Telehealth: Payer: Self-pay

## 2024-08-16 ENCOUNTER — Ambulatory Visit: Admitting: Family Medicine

## 2024-08-16 DIAGNOSIS — E039 Hypothyroidism, unspecified: Secondary | ICD-10-CM

## 2024-08-16 DIAGNOSIS — I10 Essential (primary) hypertension: Secondary | ICD-10-CM

## 2024-08-16 DIAGNOSIS — E1165 Type 2 diabetes mellitus with hyperglycemia: Secondary | ICD-10-CM

## 2024-08-16 DIAGNOSIS — F330 Major depressive disorder, recurrent, mild: Secondary | ICD-10-CM

## 2024-08-16 DIAGNOSIS — E782 Mixed hyperlipidemia: Secondary | ICD-10-CM

## 2024-08-16 NOTE — Progress Notes (Signed)
   08/16/2024  Patient ID: Robin Bonilla, female   DOB: 29-Apr-1956, 68 y.o.   MRN: [id number]  Pharmacy Quality Measure Review  This patient is appearing on a report for being at risk of failing the Glycemic Status Assessment in Diabetes measure this calendar year.   Last documented A1c or GMI 8.0% on 05/04/24 Lab Results  Component Value Date   HGBA1C 8.0 (H) 05/04/2024   HGBA1C 9.4 (H) 01/07/2024   HGBA1C 10.1 (H) 09/14/2023   Also added patient to my schedule to further review DM management given recent changes to PAP for Tresiba  and [name]  through [employer]   Nora Rooke, PharmD, [facility] Clinical Pharmacist  [phone]

## 2024-08-16 NOTE — Telephone Encounter (Signed)
 Copied from CRM [id number]. Topic: General - Other >> Aug 15, 2024  4:28 PM Robin Bonilla wrote: Reason for CRM: Pt needs a call [phone]  back regarding assistance program for medication. Pt stated on Tuesday someone use to come with the assistance program.

## 2024-08-23 NOTE — Assessment & Plan Note (Addendum)
 Poorly controlled.  No changes to medicines. Vascepa  1 g 2 capsules twice daily, fenofibrate  160 mg once daily and rosuvastatin  20 mg nightly.  Patient has had issues with compliance due to caring for her now deceased husband.  Continue to work on eating a healthy diet and exercise.  Labs drawn today.    Orders:   POCT Lipid Panel   CBC with Differential/Platelet   Comprehensive metabolic panel with GFR   icosapent  Ethyl (VASCEPA ) 1 g capsule; Take 2 capsules (2 g total) by mouth 2 (two) times daily.   rosuvastatin  (CRESTOR ) 20 MG tablet; Take 1 tablet (20 mg total) by mouth daily.

## 2024-08-23 NOTE — Progress Notes (Signed)
 Subjective:  Patient ID: Robin Bonilla, female    DOB: 21-Jan-1956  Age: 68 y.o. MRN: [id number]  Chief Complaint  Patient presents with   Medical Management of Chronic Issues    HPI: Discussed the use of AI scribe software for clinical note transcription with the patient, who gave verbal consent to proceed.  History of Present Illness Robin Bonilla is a 68 year old female with diabetes who presents with symptoms of a head cold and sinus infection.  Upper respiratory symptoms - Onset Sunday with symptoms of head cold - Nasal congestion and sinus pressure present - Ear pain noted - No fever, temperature not checked - Using Tylenol , Vicks Vapor, and cough drops for symptom relief  Glycemic control and diabetes management - Diabetes mellitus with insulin  therapy, up to 54 units - No recent blood glucose monitoring - Poor dietary intake due to several nights spent at hospital - Three boxes of insulin  available at home - Nurses have provided peanut butter, crackers, and drinks - No shakiness reported  Sleep disturbance - Significant sleep disruption - Sleeping only three to four hours at a time  Appetite and nutritional status - Poor appetite - Minimal food intake  Mood and psychosocial stressors - Recent bereavement following husband's death in hospice care - Stayed at hospital and hospice house, impacting routine and medication adherence - Depressed mood - No engagement in usual activities, including grocery shopping, due to emotional state       05/04/2024    8:00 AM 11/25/2023    1:19 PM 08/19/2023    1:20 PM 06/02/2023    1:42 PM 01/29/2023    7:50 AM  Depression screen PHQ 2/9  Decreased Interest 0 0 0 0 0  Down, Depressed, Hopeless 0 0 0 0 0  PHQ - 2 Score 0 0 0 0 0  Altered sleeping 0 0 0    Tired, decreased energy 2 0 0    Change in appetite 2 0 0    Feeling bad or failure about yourself  2 0 0    Trouble concentrating 0 0 0    Moving slowly  or fidgety/restless 0 0 0    Suicidal thoughts 0 0 0    PHQ-9 Score 6  0  0     Difficult doing work/chores Not difficult at all Not difficult at all Not difficult at all       Data saved with a previous flowsheet row definition        05/04/2024    8:00 AM  Fall Risk   Falls in the past year? 0  Number falls in past yr: 0  Injury with Fall? 0  Risk for fall due to : No Fall Risks  Follow up Falls evaluation completed    Patient Care Team: Sherre Clapper, MD as PCP - General (Family Medicine)   Review of Systems  Constitutional:  Negative for chills, fatigue and fever.  HENT:  Positive for congestion, ear pain, sinus pressure and sore throat.   Respiratory:  Positive for cough. Negative for shortness of breath.   Cardiovascular:  Negative for chest pain and palpitations.  Gastrointestinal:  Negative for abdominal pain, constipation, diarrhea, nausea and vomiting.  Endocrine: Negative for polydipsia, polyphagia and polyuria.  Genitourinary:  Negative for difficulty urinating and dysuria.  Musculoskeletal:  Negative for arthralgias, back pain and myalgias.  Skin:  Negative for rash.  Neurological:  Positive for headaches.  Psychiatric/Behavioral:  Negative for dysphoric mood. The  patient is not nervous/anxious.     Current Outpatient Medications on File Prior to Visit  Medication Sig Dispense Refill   ACCU-CHEK GUIDE TEST test strip Check sugars twice daily. 200 strip 11   Dapagliflozin  Pro-metFORMIN  ER (XIGDUO  XR) 01-999 MG TB24 Take 2 tablets by mouth daily. 180 tablet 3   fenofibrate  160 MG tablet TAKE 1 TABLET BY MOUTH EVERY DAY 90 tablet 1   Insulin  Pen Needle 32G X 4 MM MISC Use 1 needle daily as directed 100 each 3   ondansetron  (ZOFRAN -ODT) 4 MG disintegrating tablet Take 1 tablet (4 mg total) by mouth every 8 (eight) hours as needed for nausea or vomiting. 10 tablet 0   ondansetron  (ZOFRAN -ODT) 4 MG disintegrating tablet Take 1 tablet (4 mg total) by mouth every 8 (eight)  hours as needed for up to 12 doses for nausea or vomiting. 12 tablet 0   No current facility-administered medications on file prior to visit.   Past Medical History:  Diagnosis Date   Acute sinusitis    Depression    Diabetes mellitus 2005   Dysthymic disorder    Hirsutism    Hyperlipidemia    Rash, skin    Thrombocytopenia 09/14/2021   Thyroid  disease    Upper respiratory tract infection due to COVID-19 virus 08/27/2022   Past Surgical History:  Procedure Laterality Date   BACK SURGERY     CHOLECYSTECTOMY  05/30/11   PARTIAL HYSTERECTOMY     TONSILLECTOMY     TUBAL LIGATION      Family History  Problem Relation Age of Onset   Breast cancer Mother 75   CAD Father    CAD Maternal Grandmother    Hypertension Maternal Grandmother    Diabetes Paternal Grandfather    Diabetes Paternal Aunt    Social History   Socioeconomic History   Marital status: Married    Spouse name: Not on file   Number of children: Not on file   Years of education: Not on file   Highest education level: Not on file  Occupational History   Not on file  Tobacco Use   Smoking status: Never   Smokeless tobacco: Never  Substance and Sexual Activity   Alcohol use: No    Alcohol/week: 0.0 standard drinks of alcohol   Drug use: No   Sexual activity: Not on file  Other Topics Concern   Not on file  Social History Narrative   Not on file   Social Drivers of Health   Financial Resource Strain: Low Risk  (06/02/2023)   Overall Financial Resource Strain (CARDIA)    Difficulty of Paying Living Expenses: Not hard at all  Food Insecurity: No Food Insecurity (01/07/2024)   Hunger Vital Sign    Worried About Running Out of Food in the Last Year: Never true    Ran Out of Food in the Last Year: Never true  Transportation Needs: No Transportation Needs (01/07/2024)   PRAPARE - Administrator, Civil Service (Medical): No    Lack of Transportation (Non-Medical): No  Physical Activity: Unknown  (06/02/2023)   Exercise Vital Sign    Days of Exercise per Week: 0 days    Minutes of Exercise per Session: Not on file  Recent Concern: Physical Activity - Inactive (06/02/2023)   Exercise Vital Sign    Days of Exercise per Week: 0 days    Minutes of Exercise per Session: 0 min  Stress: No Stress Concern Present (06/02/2023)   [employer]  of Occupational Health - Occupational Stress Questionnaire    Feeling of Stress : Only a little  Social Connections: Socially Integrated (06/02/2023)   Social Connection and Isolation Panel    Frequency of Communication with Friends and Family: More than three times a week    Frequency of Social Gatherings with Friends and Family: More than three times a week    Attends Religious Services: More than 4 times per year    Active Member of [employer] or Organizations: Yes    Attends Engineer, Structural: More than 4 times per year    Marital Status: Married    Objective:  BP 100/60   Pulse 90   Temp (!) 97.3 F (36.3 C)   Resp 16   Ht 5' 1 (1.549 m)   Wt 172 lb (78 kg)   SpO2 97%   BMI 32.50 kg/m      08/24/2024    9:57 AM 05/06/2024    8:13 PM 05/06/2024    3:45 PM  BP/Weight  Systolic BP 100 130 123  Diastolic BP 60 66 71  Wt. (Lbs) 172    BMI 32.5 kg/m2      Physical Exam  {Perform Simple Foot Exam  Perform Detailed exam:1} {Insert foot Exam (Optional):30965}   Lab Results  Component Value Date   WBC 9.2 08/24/2024   HGB 15.0 08/24/2024   HCT 48.1 (H) 08/24/2024   PLT 254 08/24/2024   GLUCOSE 101 (H) 08/24/2024   CHOL 158 05/04/2024   TRIG 120 05/04/2024   HDL 29 (L) 05/04/2024   LDLCALC 107 (H) 05/04/2024   ALT 19 08/24/2024   AST 19 08/24/2024   NA 142 08/24/2024   K 4.4 08/24/2024   CL 100 08/24/2024   CREATININE 0.88 08/24/2024   BUN 13 08/24/2024   CO2 23 08/24/2024   TSH 6.500 (H) 08/24/2024   HGBA1C 8.0 08/24/2024    Results for orders placed or performed in visit on 08/24/24  POCT Lipid Panel    Collection Time: 08/24/24 10:18 AM  Result Value Ref Range   TC 185    HDL 23    TRG 244    LDL 113    Non-HDL 162    TC/HDL     LDL/HDL Ratio 4.8   POCT glycosylated hemoglobin (Hb A1C)   Collection Time: 08/24/24 10:18 AM  Result Value Ref Range   Hemoglobin A1C     HbA1c POC (<> result, manual entry) 8.0 4.0 - 5.6 %   HbA1c, POC (prediabetic range)     HbA1c, POC (controlled diabetic range)    POC COVID-19   Collection Time: 08/24/24 10:18 AM  Result Value Ref Range   SARS Coronavirus 2 Ag Negative Negative  CBC with Differential/Platelet   Collection Time: 08/24/24 10:42 AM  Result Value Ref Range   WBC 9.2 3.4 - 10.8 x10E3/uL   RBC 5.87 (H) 3.77 - 5.28 x10E6/uL   Hemoglobin 15.0 11.1 - 15.9 g/dL   Hematocrit 51.8 (H) 65.9 - 46.6 %   MCV 82 79 - 97 fL   MCH 25.6 (L) 26.6 - 33.0 pg   MCHC 31.2 (L) 31.5 - 35.7 g/dL   RDW 85.4 88.2 - 84.5 %   Platelets 254 150 - 450 x10E3/uL   Neutrophils 60 Not Estab. %   Lymphs 31 Not Estab. %   Monocytes 6 Not Estab. %   Eos 3 Not Estab. %   Basos 0 Not Estab. %   Neutrophils Absolute  5.5 1.4 - 7.0 x10E3/uL   Lymphocytes Absolute 2.9 0.7 - 3.1 x10E3/uL   Monocytes Absolute 0.6 0.1 - 0.9 x10E3/uL   EOS (ABSOLUTE) 0.3 0.0 - 0.4 x10E3/uL   Basophils Absolute 0.0 0.0 - 0.2 x10E3/uL   Immature Granulocytes 0 Not Estab. %   Immature Grans (Abs) 0.0 0.0 - 0.1 x10E3/uL  Comprehensive metabolic panel with GFR   Collection Time: 08/24/24 10:42 AM  Result Value Ref Range   Glucose 101 (H) 70 - 99 mg/dL   BUN 13 8 - 27 mg/dL   Creatinine, Ser 9.11 0.57 - 1.00 mg/dL   eGFR 72 >40 fO/fpw/8.26   BUN/Creatinine Ratio 15 12 - 28   Sodium 142 134 - 144 mmol/L   Potassium 4.4 3.5 - 5.2 mmol/L   Chloride 100 96 - 106 mmol/L   CO2 23 20 - 29 mmol/L   Calcium  10.2 8.7 - 10.3 mg/dL   Total Protein 7.7 6.0 - 8.5 g/dL   Albumin 4.5 3.9 - 4.9 g/dL   Globulin, Total 3.2 1.5 - 4.5 g/dL   Bilirubin Total 1.2 0.0 - 1.2 mg/dL   Alkaline Phosphatase  88 49 - 135 IU/L   AST 19 0 - 40 IU/L   ALT 19 0 - 32 IU/L  T4, free   Collection Time: 08/24/24 10:42 AM  Result Value Ref Range   Free T4 1.42 0.82 - 1.77 ng/dL  TSH   Collection Time: 08/24/24 10:42 AM  Result Value Ref Range   TSH 6.500 (H) 0.450 - 4.500 uIU/mL  .  Assessment & Plan:   Assessment & Plan Type 2 diabetes mellitus with hyperglycemia, with long-term current use of insulin  (HCC) A1c remains at 8. Irregular eating patterns and lack of appetite due to bereavement. Declined continuous glucose monitoring. - Encouraged regular blood glucose monitoring. - Ensured adequate insulin  supply. Orders:   POCT glycosylated hemoglobin (Hb A1C)  Mixed hyperlipidemia Well controlled.  No changes to medicines. Vascepa  1 g 2 capsules twice daily, fenofibrate  160 mg once daily and rosuvastatin  20 mg nightly.  Continue to work on eating a healthy diet and exercise.  Labs drawn today.   Orders:   POCT Lipid Panel   CBC with Differential/Platelet   Comprehensive metabolic panel with GFR   Hypothyroidism (acquired) Uncertain medication adherence due to bereavement and hospital stay. No immediate medication adjustment needed. - Continue current thyroid  medication regimen. Orders:   T4, free   TSH  Acute non-recurrent maxillary sinusitis Sinus pressure and ear pain with negative COVID test, no fever. - Prescribed antibiotics for sinusitis and otitis media. Orders:   POC COVID-19  Mixed diabetic hyperlipidemia associated with type 2 diabetes mellitus (HCC) Increased triglycerides and LDL levels. No immediate changes planned. - Continue current lipid management plan. Orders:   insulin  degludec (TRESIBA  FLEXTOUCH) 200 UNIT/ML FlexTouch Pen; Inject 54 Units into the skin at bedtime.  Hypertension associated with diabetes (HCC) Well controlled.  No changes to medicines.  Continue to work on eating a healthy diet and exercise.  Labs drawn today.   Orders:   Semaglutide   ([name] ) 14 MG TABS; Take 1 tablet (14 mg total) by mouth daily.  Mild recurrent major depression Likely secondary to bereavement. Reports sadness, poor sleep, and appetite. No suicidal ideation.     Body mass index is 32.5 kg/m.   Meds ordered this encounter  Medications   levothyroxine  (SYNTHROID ) 100 MCG tablet    Sig: Take 1 tablet (100 mcg total) by mouth daily before breakfast.  Dispense:  90 tablet    Refill:  0   icosapent  Ethyl (VASCEPA ) 1 g capsule    Sig: Take 2 capsules (2 g total) by mouth 2 (two) times daily.    Dispense:  360 capsule    Refill:  1    Please apply grant information below as secondary coverage.   Card No.: [id number] Card Status: Active BIN: [id number] PCN: [id number] PC Group: [id number] Help Desk: [phone]   insulin  degludec (TRESIBA  FLEXTOUCH) 200 UNIT/ML FlexTouch Pen    Sig: Inject 54 Units into the skin at bedtime.    Voucher (bill directly to the below, no COB):  Bin: [id number] PCN: [id number] GRP: [id number] ID: [id number]  Up to 35mL should be covered per [employer]   Semaglutide  ([name] ) 14 MG TABS    Sig: Take 1 tablet (14 mg total) by mouth daily.    Dispense:  90 tablet    Refill:  1   rosuvastatin  (CRESTOR ) 20 MG tablet    Sig: Take 1 tablet (20 mg total) by mouth daily.    Dispense:  90 tablet    Refill:  0   azithromycin  (ZITHROMAX ) 250 MG tablet    Sig: Take 2 tablets (500 mg total) by mouth daily for 1 day, THEN 1 tablet (250 mg total) daily for 4 days.    Dispense:  6 tablet    Refill:  0    Orders Placed This Encounter  Procedures   CBC with Differential/Platelet   Comprehensive metabolic panel with GFR   T4, free   TSH   POCT Lipid Panel   POCT glycosylated hemoglobin (Hb A1C)   POC COVID-19     I,Marla I Leal-Borjas,acting as a scribe for Abigail Free, MD.,have documented all relevant documentation on the behalf of Abigail Free, MD,as directed by  Abigail Free, MD while in the presence of Abigail Free, MD.    Follow-up: Return in about 3 months (around 11/24/2024) for chronic fasting.  An After Visit Summary was printed and given to the patient.  Abigail Free, MD [facility] [phone]

## 2024-08-23 NOTE — Assessment & Plan Note (Deleted)
 A1c remains at 8. Irregular eating patterns and lack of appetite due to bereavement. Declined continuous glucose monitoring. - Encouraged regular blood glucose monitoring. - Ensured adequate insulin  supply. Orders:   POCT glycosylated hemoglobin (Hb A1C)

## 2024-08-23 NOTE — Assessment & Plan Note (Addendum)
 Uncertain medication adherence due to bereavement and hospital stay. No immediate medication adjustment needed. - Continue current thyroid  medication regimen. Orders:   T4, free   TSH   levothyroxine  (SYNTHROID ) 100 MCG tablet; Take 1 tablet (100 mcg total) by mouth daily before breakfast.

## 2024-08-24 ENCOUNTER — Other Ambulatory Visit (HOSPITAL_BASED_OUTPATIENT_CLINIC_OR_DEPARTMENT_OTHER): Payer: Self-pay

## 2024-08-24 ENCOUNTER — Telehealth (HOSPITAL_BASED_OUTPATIENT_CLINIC_OR_DEPARTMENT_OTHER): Payer: Self-pay | Admitting: Pharmacy Technician

## 2024-08-24 ENCOUNTER — Encounter: Payer: Self-pay | Admitting: Family Medicine

## 2024-08-24 ENCOUNTER — Ambulatory Visit (INDEPENDENT_AMBULATORY_CARE_PROVIDER_SITE_OTHER): Admitting: Family Medicine

## 2024-08-24 ENCOUNTER — Ambulatory Visit

## 2024-08-24 VITALS — BP 100/60 | HR 90 | Temp 97.3°F | Resp 16 | Ht 61.0 in | Wt 172.0 lb

## 2024-08-24 DIAGNOSIS — E782 Mixed hyperlipidemia: Secondary | ICD-10-CM | POA: Diagnosis not present

## 2024-08-24 DIAGNOSIS — E1159 Type 2 diabetes mellitus with other circulatory complications: Secondary | ICD-10-CM

## 2024-08-24 DIAGNOSIS — E039 Hypothyroidism, unspecified: Secondary | ICD-10-CM | POA: Diagnosis not present

## 2024-08-24 DIAGNOSIS — I152 Hypertension secondary to endocrine disorders: Secondary | ICD-10-CM | POA: Diagnosis not present

## 2024-08-24 DIAGNOSIS — F330 Major depressive disorder, recurrent, mild: Secondary | ICD-10-CM | POA: Diagnosis not present

## 2024-08-24 DIAGNOSIS — E1165 Type 2 diabetes mellitus with hyperglycemia: Secondary | ICD-10-CM

## 2024-08-24 DIAGNOSIS — E1169 Type 2 diabetes mellitus with other specified complication: Secondary | ICD-10-CM

## 2024-08-24 DIAGNOSIS — J0100 Acute maxillary sinusitis, unspecified: Secondary | ICD-10-CM | POA: Diagnosis not present

## 2024-08-24 LAB — POCT LIPID PANEL
HDL: 23
LDL/HDL Ratio: 4.8
LDL: 113
Non-HDL: 162
TC: 185
TRG: 244

## 2024-08-24 LAB — POCT GLYCOSYLATED HEMOGLOBIN (HGB A1C): HbA1c POC (<> result, manual entry): 8 % (ref 4.0–5.6)

## 2024-08-24 LAB — POC COVID19 BINAXNOW: SARS Coronavirus 2 Ag: NEGATIVE

## 2024-08-24 MED ORDER — TRESIBA FLEXTOUCH 200 UNIT/ML ~~LOC~~ SOPN: 54.0000 [IU] | PEN_INJECTOR | Freq: Every day | SUBCUTANEOUS | Status: AC

## 2024-08-24 MED ORDER — ROSUVASTATIN CALCIUM 20 MG PO TABS
20.0000 mg | ORAL_TABLET | Freq: Every day | ORAL | 0 refills | Status: AC
  Filled 2024-08-24: qty 90, 90d supply, fill #0

## 2024-08-24 MED ORDER — LEVOTHYROXINE SODIUM 100 MCG PO TABS
100.0000 ug | ORAL_TABLET | Freq: Every day | ORAL | 0 refills | Status: AC
  Filled 2024-08-24: qty 90, 90d supply, fill #0

## 2024-08-24 MED ORDER — RYBELSUS 14 MG PO TABS
1.0000 | ORAL_TABLET | Freq: Every day | ORAL | 1 refills | Status: AC
  Filled 2024-08-24 – 2024-08-29 (×2): qty 90, 90d supply, fill #0

## 2024-08-24 MED ORDER — AZITHROMYCIN 250 MG PO TABS
ORAL_TABLET | ORAL | 0 refills | Status: AC
  Filled 2024-08-24: qty 6, 5d supply, fill #0

## 2024-08-24 MED ORDER — ICOSAPENT ETHYL 1 G PO CAPS
2.0000 g | ORAL_CAPSULE | Freq: Two times a day (BID) | ORAL | 1 refills | Status: AC
  Filled 2024-08-24: qty 360, 90d supply, fill #0

## 2024-08-24 NOTE — Telephone Encounter (Signed)
 Medication: [name]  14mg   Able to fill? No Prior authorization required? Yes Co-pay before assistance: n/a

## 2024-08-24 NOTE — Telephone Encounter (Signed)
 PAP: Patient assistance application for Xigduo  has been approved by PAP Companies: [employer] from 09/29/2024 to 09/28/2025. Medication should be delivered to PAP Delivery: Home. For further shipping updates, please contact [employer] ([employer]) at [phone]. Patient ID is: [id number]

## 2024-08-24 NOTE — Progress Notes (Unsigned)
   08/24/2024 Name: Robin Bonilla MRN: [id number] DOB: 10-17-1955  Chief Complaint  Patient presents with   Diabetes   Medication Assistance    Robin Bonilla is a 68 y.o. year old female who was referred for medication management by their primary care provider, Cox, Kirsten, MD. They presented for a face to face visit today.   They were referred to the pharmacist by their PCP for assistance in managing diabetes and medication access    Subjective:  Care Team: Primary Care Provider: Sherre Clapper, MD ; Next Scheduled Visit:  Future Appointments  Date Time Provider Department Center  09/07/2024 10:00 AM Robin Bonilla HERO, LPN [facility] [place]  11/28/2024  9:20 AM Cox, Clapper, MD [facility] [place]    Medication Access/Adherence  Current Pharmacy:  [facility] [address] [place] - [address] [facility] [place] [address] Phone: [phone] Fax: [phone]  [facility] [address], [address] [place] [address] Phone: [phone] Fax: [phone]  [id number] [place], [place] - [address] [place] [address] Phone: [phone] Fax: [phone]   Patient reports affordability concerns with their medications: Yes  Patient reports access/transportation concerns to their pharmacy: No  Patient reports adherence concerns with their medications:  not typically, just dependent on life circumstances. Husband, Robin Bonilla recently passed away.    Majority of visit spent on financial concerns related to medications. Patient aware that PAP for Tresiba  and [name]  may be coming to an end. Has not spoke with medicare counselor at this time.    Objective:  Lab Results  Component Value Date   HGBA1C 8.0 (H) 05/04/2024    Lab Results  Component Value Date   CREATININE 0.80 05/06/2024   BUN 7 (L) 05/06/2024   NA 140 05/06/2024   K 3.7 05/06/2024   CL 107  05/06/2024   CO2 21 (L) 05/06/2024    Lab Results  Component Value Date   CHOL 158 05/04/2024   HDL 29 (L) 05/04/2024   LDLCALC 107 (H) 05/04/2024   TRIG 120 05/04/2024   CHOLHDL 5.4 (H) 05/04/2024     Assessment/Plan:   Medication assistance - reviewed PAP needs, discussed possibility of changing medicare plans, particularly once that might suit her medication/medical needs while considering special needs for DM  - assisting patient while in office with Xigduo  PAP - we called [employer] and patient provided her updated verbal consent for 2026 program. Is now fully approved.  Follow Up Plan: patient plans to reach out to health plan or ship to review 2026 options. Provided contact card in office today.   Robin Bonilla, PharmD, BCGP Clinical Pharmacist  [phone]

## 2024-08-25 LAB — COMPREHENSIVE METABOLIC PANEL WITH GFR
ALT: 19 IU/L (ref 0–32)
AST: 19 IU/L (ref 0–40)
Albumin: 4.5 g/dL (ref 3.9–4.9)
Alkaline Phosphatase: 88 IU/L (ref 49–135)
BUN/Creatinine Ratio: 15 (ref 12–28)
BUN: 13 mg/dL (ref 8–27)
Bilirubin Total: 1.2 mg/dL (ref 0.0–1.2)
CO2: 23 mmol/L (ref 20–29)
Calcium: 10.2 mg/dL (ref 8.7–10.3)
Chloride: 100 mmol/L (ref 96–106)
Creatinine, Ser: 0.88 mg/dL (ref 0.57–1.00)
Globulin, Total: 3.2 g/dL (ref 1.5–4.5)
Glucose: 101 mg/dL — ABNORMAL HIGH (ref 70–99)
Potassium: 4.4 mmol/L (ref 3.5–5.2)
Sodium: 142 mmol/L (ref 134–144)
Total Protein: 7.7 g/dL (ref 6.0–8.5)
eGFR: 72 mL/min/1.73 (ref >59)

## 2024-08-25 LAB — TSH: TSH: 6.5 u[IU]/mL — ABNORMAL HIGH (ref 0.450–4.500)

## 2024-08-25 LAB — CBC WITH DIFFERENTIAL/PLATELET
Basophils Absolute: 0 x10E3/uL (ref 0.0–0.2)
Basos: 0 %
EOS (ABSOLUTE): 0.3 x10E3/uL (ref 0.0–0.4)
Eos: 3 %
Hematocrit: 48.1 % — ABNORMAL HIGH (ref 34.0–46.6)
Hemoglobin: 15 g/dL (ref 11.1–15.9)
Immature Grans (Abs): 0 x10E3/uL (ref 0.0–0.1)
Immature Granulocytes: 0 %
Lymphocytes Absolute: 2.9 x10E3/uL (ref 0.7–3.1)
Lymphs: 31 %
MCH: 25.6 pg — ABNORMAL LOW (ref 26.6–33.0)
MCHC: 31.2 g/dL — ABNORMAL LOW (ref 31.5–35.7)
MCV: 82 fL (ref 79–97)
Monocytes Absolute: 0.6 x10E3/uL (ref 0.1–0.9)
Monocytes: 6 %
Neutrophils Absolute: 5.5 x10E3/uL (ref 1.4–7.0)
Neutrophils: 60 %
Platelets: 254 x10E3/uL (ref 150–450)
RBC: 5.87 x10E6/uL — ABNORMAL HIGH (ref 3.77–5.28)
RDW: 14.5 % (ref 11.7–15.4)
WBC: 9.2 x10E3/uL (ref 3.4–10.8)

## 2024-08-25 LAB — T4, FREE: Free T4: 1.42 ng/dL (ref 0.82–1.77)

## 2024-08-26 DIAGNOSIS — I152 Hypertension secondary to endocrine disorders: Secondary | ICD-10-CM | POA: Insufficient documentation

## 2024-08-26 DIAGNOSIS — E1169 Type 2 diabetes mellitus with other specified complication: Secondary | ICD-10-CM | POA: Insufficient documentation

## 2024-08-26 DIAGNOSIS — J0100 Acute maxillary sinusitis, unspecified: Secondary | ICD-10-CM | POA: Insufficient documentation

## 2024-08-26 NOTE — Assessment & Plan Note (Deleted)
 Increased triglycerides and LDL levels. No immediate changes planned. - Continue current lipid management plan. Orders:   insulin  degludec (TRESIBA  FLEXTOUCH) 200 UNIT/ML FlexTouch Pen; Inject 54 Units into the skin at bedtime.

## 2024-08-26 NOTE — Assessment & Plan Note (Addendum)
 Sinus pressure and ear pain with negative COVID test, no fever. - Prescribed antibiotics for sinusitis and otitis media. Orders:   POC COVID-19

## 2024-08-26 NOTE — Assessment & Plan Note (Addendum)
 Likely secondary to bereavement. Reports sadness, poor sleep, and appetite. No suicidal ideation. Recommend bereavement counseling

## 2024-08-26 NOTE — Assessment & Plan Note (Addendum)
 Well controlled.  No changes to medicines.  Continue to work on eating a healthy diet and exercise.  Labs drawn today.   Orders:   Semaglutide  ([name] ) 14 MG TABS; Take 1 tablet (14 mg total) by mouth daily.

## 2024-08-28 ENCOUNTER — Ambulatory Visit: Payer: Self-pay | Admitting: Family Medicine

## 2024-08-29 ENCOUNTER — Telehealth (HOSPITAL_BASED_OUTPATIENT_CLINIC_OR_DEPARTMENT_OTHER): Payer: Self-pay

## 2024-08-29 ENCOUNTER — Other Ambulatory Visit: Payer: Self-pay

## 2024-08-29 ENCOUNTER — Other Ambulatory Visit (HOSPITAL_COMMUNITY): Payer: Self-pay

## 2024-08-29 ENCOUNTER — Other Ambulatory Visit (HOSPITAL_BASED_OUTPATIENT_CLINIC_OR_DEPARTMENT_OTHER): Payer: Self-pay

## 2024-08-29 NOTE — Patient Instructions (Signed)
  VISIT SUMMARY: During your visit, we addressed your symptoms of a head cold and sinus infection, your diabetes management, sleep disturbance, appetite and nutritional status, and mood following your recent bereavement.  YOUR PLAN: ACUTE MAXILLARY SINUSITIS WITH BILATERAL OTITIS MEDIA: You have sinus pressure and ear pain, but no fever. -You have been prescribed antibiotics for the sinus infection and ear pain.  TYPE 2 DIABETES MELLITUS: Your blood sugar control has been irregular due to recent events and poor appetite. -Please monitor your blood glucose regularly. -You have enough insulin  at home, so continue using it as prescribed.  HYPOTHYROIDISM: Your thyroid  medication adherence has been uncertain due to recent events. -Continue taking your current thyroid  medication as prescribed.  MIXED HYPERLIPIDEMIA: Your triglycerides and LDL levels are elevated. -Continue with your current lipid management plan.  DEPRESSIVE SYMPTOMS: You are experiencing sadness, poor sleep, and appetite due to bereavement. -It's important to take care of your mental health. Consider talking to a counselor or joining a support group.                      Contains text generated by [employer].                                 Contains text generated by [employer]bridge.

## 2024-08-29 NOTE — Telephone Encounter (Signed)
 PA request has been Received. New Encounter has been or will be created for follow up. For additional info see Pharmacy Prior Auth telephone encounter from 08/29/24.

## 2024-08-29 NOTE — Telephone Encounter (Signed)
 Pharmacy Patient Advocate Encounter  Received notification from [facility] [employer] that Prior Authorization for [name]  14MG  tablets  has been APPROVED from 08/29/24 to 08/29/25. Ran test claim, Copay is $117.50. This test claim was processed through [facility]- copay amounts may vary at other pharmacies due to pharmacy/plan contracts, or as the patient moves through the different stages of their insurance plan.   PA #/Case ID/Reference #: [id number]

## 2024-09-02 ENCOUNTER — Other Ambulatory Visit (HOSPITAL_COMMUNITY): Payer: Self-pay

## 2024-09-02 ENCOUNTER — Encounter (HOSPITAL_COMMUNITY): Payer: Self-pay

## 2024-09-02 NOTE — Progress Notes (Signed)
 Pharmacy Quality Measure Review  This patient is appearing on a report for being at risk of failing the adherence measure for cholesterol (statin) medications this calendar year.   Medication: Rosuvastatin  20 mg Last fill date: 08/26/2024 for 90 day supply  Insurance report was not up to date. No action needed at this time.   Powell Gallus, PharmD, MPH Pharmacy Resident

## 2024-09-07 ENCOUNTER — Ambulatory Visit

## 2024-09-07 VITALS — Ht 61.0 in | Wt 174.0 lb

## 2024-09-07 DIAGNOSIS — Z0000 Encounter for general adult medical examination without abnormal findings: Secondary | ICD-10-CM | POA: Diagnosis not present

## 2024-09-07 NOTE — Patient Instructions (Signed)
 Ms. Robin Bonilla , Thank you for taking time to come for your Medicare Wellness Visit. I appreciate your ongoing commitment to your health goals. Please review the following plan we discussed and let me know if I can assist you in the future.   These are the goals we discussed:  Goals   None     This is a list of the screening recommended for you and due dates:  Health Maintenance  Topic Date Due   Zoster (Shingles) Vaccine (1 of 2) Never done   Colon Cancer Screening  05/01/2022   COVID-19 Vaccine (1 - 2025-26 season) Never done   DTaP/Tdap/Td vaccine (1 - Tdap) 09/13/2024*   Eye exam for diabetics  11/30/2024   Yearly kidney health urinalysis for diabetes  01/06/2025   Hemoglobin A1C  02/21/2025   Yearly kidney function blood test for diabetes  08/24/2025   Complete foot exam   08/24/2025   Medicare Annual Wellness Visit  09/07/2025   Breast Cancer Screening  01/19/2026   Pneumococcal Vaccine for age over 58  Completed   Flu Shot  Completed   Osteoporosis screening with Bone Density Scan  Completed   Hepatitis C Screening  Completed   Meningitis B Vaccine  Aged Out  *Topic was postponed. The date shown is not the original due date.    Advanced directives: yes  Next appointment: Follow up in one year for your annual wellness visit 12.16.2026 at 2:30 pm.    Preventive Care 66 Years and Older, Female Preventive care refers to lifestyle choices and visits with your health care provider that can promote health and wellness. What does preventive care include? A yearly physical exam. This is also called an annual well check. Dental exams once or twice a year. Routine eye exams. Ask your health care provider how often you should have your eyes checked. Personal lifestyle choices, including: Daily care of your teeth and gums. Regular physical activity. Eating a healthy diet. Avoiding tobacco and drug use. Limiting alcohol use. Practicing safe sex. Taking low-dose aspirin  every  day. Taking vitamin and mineral supplements as recommended by your health care provider. What happens during an annual well check? The services and screenings done by your health care provider during your annual well check will depend on your age, overall health, lifestyle risk factors, and family history of disease. Counseling  Your health care provider may ask you questions about your: Alcohol use. Tobacco use. Drug use. Emotional well-being. Home and relationship well-being. Sexual activity. Eating habits. History of falls. Memory and ability to understand (cognition). Work and work astronomer. Reproductive health. Screening  You may have the following tests or measurements: Height, weight, and BMI. Blood pressure. Lipid and cholesterol levels. These may be checked every 5 years, or more frequently if you are over 58 years old. Skin check. Lung cancer screening. You may have this screening every year starting at age 79 if you have a 30-pack-year history of smoking and currently smoke or have quit within the past 15 years. Fecal occult blood test (FOBT) of the stool. You may have this test every year starting at age 58. Flexible sigmoidoscopy or colonoscopy. You may have a sigmoidoscopy every 5 years or a colonoscopy every 10 years starting at age 19. Hepatitis C blood test. Hepatitis B blood test. Sexually transmitted disease (STD) testing. Diabetes screening. This is done by checking your blood sugar (glucose) after you have not eaten for a while (fasting). You may have this done every 1-3 years.  Bone density scan. This is done to screen for osteoporosis. You may have this done starting at age 49. Mammogram. This may be done every 1-2 years. Talk to your health care provider about how often you should have regular mammograms. Talk with your health care provider about your test results, treatment options, and if necessary, the need for more tests. Vaccines  Your health care  provider may recommend certain vaccines, such as: Influenza vaccine. This is recommended every year. Tetanus, diphtheria, and acellular pertussis (Tdap, Td) vaccine. You may need a Td booster every 10 years. Zoster vaccine. You may need this after age 69. Pneumococcal 13-valent conjugate (PCV13) vaccine. One dose is recommended after age 40. Pneumococcal polysaccharide (PPSV23) vaccine. One dose is recommended after age 85. Talk to your health care provider about which screenings and vaccines you need and how often you need them. This information is not intended to replace advice given to you by your health care provider. Make sure you discuss any questions you have with your health care provider. Document Released: 10/12/2015 Document Revised: 06/04/2016 Document Reviewed: 07/17/2015 Elsevier Interactive Patient Education  2017 [employer].  Fall Prevention in the Home Falls can cause injuries. They can happen to people of all ages. There are many things you can do to make your home safe and to help prevent falls. What can I do on the outside of my home? Regularly fix the edges of walkways and driveways and fix any cracks. Remove anything that might make you trip as you walk through a door, such as a raised step or threshold. Trim any bushes or trees on the path to your home. Use bright outdoor lighting. Clear any walking paths of anything that might make someone trip, such as rocks or tools. Regularly check to see if handrails are loose or broken. Make sure that both sides of any steps have handrails. Any raised decks and porches should have guardrails on the edges. Have any leaves, snow, or ice cleared regularly. Use sand or salt on walking paths during winter. Clean up any spills in your garage right away. This includes oil or grease spills. What can I do in the bathroom? Use night lights. Install grab bars by the toilet and in the tub and shower. Do not use towel bars as grab  bars. Use non-skid mats or decals in the tub or shower. If you need to sit down in the shower, use a plastic, non-slip stool. Keep the floor dry. Clean up any water that spills on the floor as soon as it happens. Remove soap buildup in the tub or shower regularly. Attach bath mats securely with double-sided non-slip rug tape. Do not have throw rugs and other things on the floor that can make you trip. What can I do in the bedroom? Use night lights. Make sure that you have a light by your bed that is easy to reach. Do not use any sheets or blankets that are too big for your bed. They should not hang down onto the floor. Have a firm chair that has side arms. You can use this for support while you get dressed. Do not have throw rugs and other things on the floor that can make you trip. What can I do in the kitchen? Clean up any spills right away. Avoid walking on wet floors. Keep items that you use a lot in easy-to-reach places. If you need to reach something above you, use a strong step stool that has a grab bar.  Keep electrical cords out of the way. Do not use floor polish or wax that makes floors slippery. If you must use wax, use non-skid floor wax. Do not have throw rugs and other things on the floor that can make you trip. What can I do with my stairs? Do not leave any items on the stairs. Make sure that there are handrails on both sides of the stairs and use them. Fix handrails that are broken or loose. Make sure that handrails are as long as the stairways. Check any carpeting to make sure that it is firmly attached to the stairs. Fix any carpet that is loose or worn. Avoid having throw rugs at the top or bottom of the stairs. If you do have throw rugs, attach them to the floor with carpet tape. Make sure that you have a light switch at the top of the stairs and the bottom of the stairs. If you do not have them, ask someone to add them for you. What else can I do to help prevent  falls? Wear shoes that: Do not have high heels. Have rubber bottoms. Are comfortable and fit you well. Are closed at the toe. Do not wear sandals. If you use a stepladder: Make sure that it is fully opened. Do not climb a closed stepladder. Make sure that both sides of the stepladder are locked into place. Ask someone to hold it for you, if possible. Clearly mark and make sure that you can see: Any grab bars or handrails. First and last steps. Where the edge of each step is. Use tools that help you move around (mobility aids) if they are needed. These include: Canes. Walkers. Scooters. Crutches. Turn on the lights when you go into a dark area. Replace any light bulbs as soon as they burn out. Set up your furniture so you have a clear path. Avoid moving your furniture around. If any of your floors are uneven, fix them. If there are any pets around you, be aware of where they are. Review your medicines with your doctor. Some medicines can make you feel dizzy. This can increase your chance of falling. Ask your doctor what other things that you can do to help prevent falls. This information is not intended to replace advice given to you by your health care provider. Make sure you discuss any questions you have with your health care provider. Document Released: 07/12/2009 Document Revised: 02/21/2016 Document Reviewed: 10/20/2014 Elsevier Interactive Patient Education  2017 [employer].

## 2024-09-07 NOTE — Progress Notes (Signed)
 Chief Complaint  Patient presents with   Annual Exam     Subjective:   Robin Bonilla is a 68 y.o. female who presents for a Medicare Annual Wellness Visit.  Visit info / Clinical Intake: Medicare Wellness Visit Type:: Subsequent Annual Wellness Visit Persons participating in visit and providing information:: patient Medicare Wellness Visit Mode:: Telephone If telephone:: video declined Since this visit was completed virtually, some vitals may be partially provided or unavailable. Missing vitals are due to the limitations of the virtual format.: Unable to obtain vitals - no equipment If Telephone or Video please confirm:: I connected with patient using audio/video enable telemedicine. I verified patient identity with two identifiers, discussed telehealth limitations, and patient agreed to proceed. Patient Location:: home Provider Location:: office Interpreter Needed?: No Pre-visit prep was completed: no AWV questionnaire completed by patient prior to visit?: no Living arrangements:: (!) lives alone Patient's Overall Health Status Rating: (!) fair Typical amount of pain: none Does pain affect daily life?: no Are you currently prescribed opioids?: no  Dietary Habits and Nutritional Risks How many meals a day?: 2 Eats fruit and vegetables daily?: yes Most meals are obtained by: preparing own meals; eating out In the last 2 weeks, have you had any of the following?: none Diabetic:: (!) yes Any non-healing wounds?: no How often do you check your BS?: 1 Would you like to be referred to a Nutritionist or for Diabetic Management? : no  Functional Status Activities of Daily Living (to include ambulation/medication): Independent Ambulation: Independent Medication Administration: Independent Home Management (perform basic housework or laundry): Independent Manage your own finances?: yes Primary transportation is: driving Concerns about vision?: no *vision screening is  required for WTM* Concerns about hearing?: no  Fall Screening Falls in the past year?: 0 Number of falls in past year: 0 Was there an injury with Fall?: 0 Fall Risk Category Calculator: 0 Patient Fall Risk Level: Low Fall Risk  Fall Risk Patient at Risk for Falls Due to: No Fall Risks Fall risk Follow up: Falls evaluation completed  Home and Transportation Safety: All rugs have non-skid backing?: yes All stairs or steps have railings?: yes Grab bars in the bathtub or shower?: (!) no Have non-skid surface in bathtub or shower?: (!) no Good home lighting?: yes Regular seat belt use?: yes Hospital stays in the last year:: no  Cognitive Assessment Difficulty concentrating, remembering, or making decisions? : yes Will 6CIT or Mini Cog be Completed: yes What year is it?: 0 points What month is it?: 0 points Give patient an address phrase to remember (5 components): [address] [place] About what time is it?: 0 points Count backwards from 20 to 1: 0 points Say the months of the year in reverse: 0 points Repeat the address phrase from earlier: 0 points 6 CIT Score: 0 points  Advance Directives (For Healthcare) Does Patient Have a Medical Advance Directive?: Yes Type of Advance Directive: Healthcare Power of [place]; Living will  Reviewed/Updated  Reviewed/Updated: Reviewed All (Medical, Surgical, Family, Medications, Allergies, Care Teams, Patient Goals)    Allergies (verified) Lunesta  [eszopiclone ], Penicillins, and Simvastatin   Current Medications (verified) Outpatient Encounter Medications as of 09/07/2024  Medication Sig   ACCU-CHEK GUIDE TEST test strip Check sugars twice daily.   Dapagliflozin  Pro-metFORMIN  ER (XIGDUO  XR) 01-999 MG TB24 Take 2 tablets by mouth daily.   fenofibrate  160 MG tablet TAKE 1 TABLET BY MOUTH EVERY DAY   icosapent  Ethyl (VASCEPA ) 1 g capsule Take 2 capsules (2  g total) by mouth 2 (two) times daily.   insulin  degludec (TRESIBA  FLEXTOUCH)  200 UNIT/ML FlexTouch Pen Inject 54 Units into the skin at bedtime.   Insulin  Pen Needle 32G X 4 MM MISC Use 1 needle daily as directed   levothyroxine  (SYNTHROID ) 100 MCG tablet Take 1 tablet (100 mcg total) by mouth daily before breakfast.   ondansetron  (ZOFRAN -ODT) 4 MG disintegrating tablet Take 1 tablet (4 mg total) by mouth every 8 (eight) hours as needed for up to 12 doses for nausea or vomiting.   rosuvastatin  (CRESTOR ) 20 MG tablet Take 1 tablet (20 mg total) by mouth daily.   Semaglutide  ([name] ) 14 MG TABS Take 1 tablet (14 mg total) by mouth daily.   [DISCONTINUED] ondansetron  (ZOFRAN -ODT) 4 MG disintegrating tablet Take 1 tablet (4 mg total) by mouth every 8 (eight) hours as needed for nausea or vomiting.   No facility-administered encounter medications on file as of 09/07/2024.    History: Past Medical History:  Diagnosis Date   Acute sinusitis    Depression    Diabetes mellitus 2005   Dysthymic disorder    Hirsutism    Hyperlipidemia    Rash, skin    Thrombocytopenia 09/14/2021   Thyroid  disease    Upper respiratory tract infection due to COVID-19 virus 08/27/2022   Past Surgical History:  Procedure Laterality Date   BACK SURGERY     CHOLECYSTECTOMY  05/30/11   PARTIAL HYSTERECTOMY     TONSILLECTOMY     TUBAL LIGATION     Family History  Problem Relation Age of Onset   Breast cancer Mother 75   CAD Father    CAD Maternal Grandmother    Hypertension Maternal Grandmother    Diabetes Paternal Grandfather    Diabetes Paternal Aunt    Social History   Occupational History   Not on file  Tobacco Use   Smoking status: Never   Smokeless tobacco: Never  Substance and Sexual Activity   Alcohol use: No    Alcohol/week: 0.0 standard drinks of alcohol   Drug use: No   Sexual activity: Not on file   Tobacco Counseling Counseling given: Not Answered  SDOH Screenings   Food Insecurity: No Food Insecurity (09/07/2024)  Housing: Unknown (09/07/2024)   Transportation Needs: No Transportation Needs (09/07/2024)  Utilities: Not At Risk (09/07/2024)  Alcohol Screen: Low Risk  (06/02/2023)  Depression (PHQ2-9): High Risk (08/29/2024)  Financial Resource Strain: Low Risk  (06/02/2023)  Physical Activity: Inactive (09/07/2024)  Social Connections: Moderately Integrated (09/07/2024)  Stress: Stress Concern Present (09/07/2024)  Tobacco Use: Low Risk  (09/07/2024)  Health Literacy: Adequate Health Literacy (09/07/2024)   See flowsheets for full screening details  Depression Screen Depression Screening Exception Documentation Depression Screening Exception:: Patient refusal (patient recently lost her husband.)  PHQ 2 & 9 Depression Scale- Over the past 2 weeks, how often have you been bothered by any of the following problems? Little interest or pleasure in doing things: 3 Feeling down, depressed, or hopeless (PHQ Adolescent also includes...irritable): 3 PHQ-2 Total Score: 6 Trouble falling or staying asleep, or sleeping too much: 3 Feeling tired or having little energy: 3 Poor appetite or overeating (PHQ Adolescent also includes...weight loss): 3 Feeling bad about yourself - or that you are a failure or have let yourself or your family down: 1 Trouble concentrating on things, such as reading the newspaper or watching television (PHQ Adolescent also includes...like school work): 0 Moving or speaking so slowly that other people could have noticed.  Or the opposite - being so fidgety or restless that you have been moving around a lot more than usual: 1 Thoughts that you would be better off dead, or of hurting yourself in some way: 0 PHQ-9 Total Score: 17 If you checked off any problems, how difficult have these problems made it for you to do your work, take care of things at home, or get along with other people?: Very difficult  Depression Treatment Depression Interventions/Treatment : Counseling     Goals Addressed   None           Objective:    Today's Vitals   09/07/24 0958  Weight: 174 lb (78.9 kg)  Height: 5' 1 (1.549 m)   Body mass index is 32.88 kg/m.  Hearing/Vision screen No results found. Immunizations and Health Maintenance Health Maintenance  Topic Date Due   Zoster Vaccines- Shingrix (1 of 2) Never done   Colonoscopy  05/01/2022   COVID-19 Vaccine (1 - 2025-26 season) Never done   DTaP/Tdap/Td (1 - Tdap) 09/13/2024 (Originally 12/10/1974)   OPHTHALMOLOGY EXAM  11/30/2024   Diabetic kidney evaluation - Urine ACR  01/06/2025   HEMOGLOBIN A1C  02/21/2025   Diabetic kidney evaluation - eGFR measurement  08/24/2025   FOOT EXAM  08/24/2025   Medicare Annual Wellness (AWV)  09/07/2025   Mammogram  01/19/2026   Pneumococcal Vaccine: 50+ Years  Completed   Influenza Vaccine  Completed   Bone Density Scan  Completed   Hepatitis C Screening  Completed   Meningococcal B Vaccine  Aged Out        Assessment/Plan:  This is a routine wellness examination for Robin Bonilla.  Patient Care Team: Sherre Clapper, MD as PCP - General (Family Medicine)  I have personally reviewed and noted the following in the patients chart:   Medical and social history Use of alcohol, tobacco or illicit drugs  Current medications and supplements including opioid prescriptions. Functional ability and status Nutritional status Physical activity Advanced directives List of other physicians Hospitalizations, surgeries, and ER visits in previous 12 months Vitals Screenings to include cognitive, depression, and falls Referrals and appointments  No orders of the defined types were placed in this encounter.  In addition, I have reviewed and discussed with patient certain preventive protocols, quality metrics, and best practice recommendations. A written personalized care plan for preventive services as well as general preventive health recommendations were provided to patient.   [place] Mailman, [place]   09/07/2024   Return  in 1 year (on 09/07/2025).  After Visit Summary: (In Person-Declined) Patient declined AVS at this time.  Nurse Notes: I spent 20 minutes on the phone with patient. She has recently lost her husband.told patient if she needed anything to please reach out to us . Patient has no questions or concerns at this time.

## 2024-09-12 ENCOUNTER — Other Ambulatory Visit (HOSPITAL_BASED_OUTPATIENT_CLINIC_OR_DEPARTMENT_OTHER): Payer: Self-pay

## 2024-11-28 ENCOUNTER — Ambulatory Visit: Admitting: Family Medicine

## 2025-09-13 ENCOUNTER — Ambulatory Visit
